# Patient Record
Sex: Male | Born: 1935 | Race: White | Hispanic: No | Smoking: Former smoker
Health system: Southern US, Community
[De-identification: ages and names within clinical notes are randomized; demographics above are authoritative.]

## PROBLEM LIST (undated history)

## (undated) DIAGNOSIS — I219 Acute myocardial infarction, unspecified: Secondary | ICD-10-CM

## (undated) DIAGNOSIS — I10 Essential (primary) hypertension: Secondary | ICD-10-CM

## (undated) DIAGNOSIS — I2510 Atherosclerotic heart disease of native coronary artery without angina pectoris: Secondary | ICD-10-CM

## (undated) DIAGNOSIS — J309 Allergic rhinitis, unspecified: Secondary | ICD-10-CM

## (undated) DIAGNOSIS — M1990 Unspecified osteoarthritis, unspecified site: Secondary | ICD-10-CM

## (undated) DIAGNOSIS — E785 Hyperlipidemia, unspecified: Secondary | ICD-10-CM

## (undated) DIAGNOSIS — M545 Low back pain, unspecified: Secondary | ICD-10-CM

## (undated) DIAGNOSIS — Z9289 Personal history of other medical treatment: Secondary | ICD-10-CM

## (undated) DIAGNOSIS — C49A2 Gastrointestinal stromal tumor of stomach: Secondary | ICD-10-CM

## (undated) DIAGNOSIS — R32 Unspecified urinary incontinence: Secondary | ICD-10-CM

## (undated) DIAGNOSIS — D6959 Other secondary thrombocytopenia: Secondary | ICD-10-CM

## (undated) DIAGNOSIS — C61 Malignant neoplasm of prostate: Secondary | ICD-10-CM

## (undated) DIAGNOSIS — D649 Anemia, unspecified: Secondary | ICD-10-CM

## (undated) DIAGNOSIS — M47816 Spondylosis without myelopathy or radiculopathy, lumbar region: Principal | ICD-10-CM

## (undated) DIAGNOSIS — Z9049 Acquired absence of other specified parts of digestive tract: Secondary | ICD-10-CM

## (undated) DIAGNOSIS — K219 Gastro-esophageal reflux disease without esophagitis: Secondary | ICD-10-CM

## (undated) DIAGNOSIS — N400 Enlarged prostate without lower urinary tract symptoms: Secondary | ICD-10-CM

## (undated) DIAGNOSIS — K635 Polyp of colon: Secondary | ICD-10-CM

## (undated) HISTORY — DX: Low back pain, unspecified: M54.50

## (undated) HISTORY — DX: Unspecified osteoarthritis, unspecified site: M19.90

## (undated) HISTORY — DX: Personal history of other medical treatment: Z92.89

## (undated) HISTORY — DX: Low back pain: M54.5

## (undated) HISTORY — DX: Unspecified urinary incontinence: R32

## (undated) HISTORY — DX: Spondylosis without myelopathy or radiculopathy, lumbar region: M47.816

---
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
## 1984-10-07 HISTORY — PX: CHOLECYSTECTOMY: SHX55

## 2000-01-12 ENCOUNTER — Inpatient Hospital Stay (HOSPITAL_COMMUNITY): Admission: EM | Admit: 2000-01-12 | Discharge: 2000-01-14 | Payer: Self-pay | Admitting: Emergency Medicine

## 2000-01-12 ENCOUNTER — Encounter: Payer: Self-pay | Admitting: Emergency Medicine

## 2002-06-08 ENCOUNTER — Encounter: Payer: Self-pay | Admitting: Family Medicine

## 2002-06-08 ENCOUNTER — Encounter: Admission: RE | Admit: 2002-06-08 | Discharge: 2002-06-08 | Payer: Self-pay | Admitting: Family Medicine

## 2004-10-19 ENCOUNTER — Encounter: Admission: RE | Admit: 2004-10-19 | Discharge: 2004-10-19 | Payer: Self-pay | Admitting: Family Medicine

## 2004-12-25 ENCOUNTER — Encounter: Admission: RE | Admit: 2004-12-25 | Discharge: 2004-12-25 | Payer: Self-pay | Admitting: Family Medicine

## 2004-12-27 ENCOUNTER — Encounter: Admission: RE | Admit: 2004-12-27 | Discharge: 2004-12-27 | Payer: Self-pay | Admitting: Family Medicine

## 2006-05-12 ENCOUNTER — Encounter: Admission: RE | Admit: 2006-05-12 | Discharge: 2006-05-12 | Payer: Self-pay | Admitting: Family Medicine

## 2007-06-15 ENCOUNTER — Emergency Department (HOSPITAL_COMMUNITY): Admission: EM | Admit: 2007-06-15 | Discharge: 2007-06-15 | Payer: Self-pay | Admitting: Emergency Medicine

## 2008-05-13 ENCOUNTER — Ambulatory Visit (HOSPITAL_COMMUNITY): Admission: RE | Admit: 2008-05-13 | Discharge: 2008-05-13 | Payer: Self-pay | Admitting: Urology

## 2008-07-12 ENCOUNTER — Encounter: Admission: RE | Admit: 2008-07-12 | Discharge: 2008-07-12 | Payer: Self-pay | Admitting: General Surgery

## 2008-10-11 ENCOUNTER — Encounter: Admission: RE | Admit: 2008-10-11 | Discharge: 2008-10-11 | Payer: Self-pay | Admitting: Family Medicine

## 2008-11-09 ENCOUNTER — Encounter: Admission: RE | Admit: 2008-11-09 | Discharge: 2008-11-09 | Payer: Self-pay | Admitting: General Surgery

## 2009-04-21 ENCOUNTER — Inpatient Hospital Stay (HOSPITAL_COMMUNITY): Admission: EM | Admit: 2009-04-21 | Discharge: 2009-04-26 | Payer: Self-pay | Admitting: Emergency Medicine

## 2009-04-24 HISTORY — PX: CORONARY ANGIOPLASTY WITH STENT PLACEMENT: SHX49

## 2009-05-07 DIAGNOSIS — D649 Anemia, unspecified: Secondary | ICD-10-CM

## 2009-05-07 HISTORY — PX: OTHER SURGICAL HISTORY: SHX169

## 2009-05-07 HISTORY — DX: Anemia, unspecified: D64.9

## 2009-05-10 ENCOUNTER — Ambulatory Visit: Payer: Self-pay | Admitting: Critical Care Medicine

## 2009-05-10 ENCOUNTER — Ambulatory Visit: Payer: Self-pay | Admitting: Internal Medicine

## 2009-05-10 ENCOUNTER — Inpatient Hospital Stay (HOSPITAL_COMMUNITY): Admission: EM | Admit: 2009-05-10 | Discharge: 2009-05-20 | Payer: Self-pay | Admitting: Emergency Medicine

## 2009-06-29 ENCOUNTER — Encounter (HOSPITAL_COMMUNITY): Admission: RE | Admit: 2009-06-29 | Discharge: 2009-10-05 | Payer: Self-pay | Admitting: Cardiology

## 2009-10-06 ENCOUNTER — Inpatient Hospital Stay (HOSPITAL_COMMUNITY): Admission: EM | Admit: 2009-10-06 | Discharge: 2009-10-26 | Payer: Self-pay | Admitting: Emergency Medicine

## 2009-10-07 HISTORY — PX: PARTIAL GASTRECTOMY: SHX6003

## 2009-10-09 ENCOUNTER — Ambulatory Visit: Payer: Self-pay | Admitting: Vascular Surgery

## 2009-10-09 ENCOUNTER — Encounter (INDEPENDENT_AMBULATORY_CARE_PROVIDER_SITE_OTHER): Payer: Self-pay | Admitting: Internal Medicine

## 2009-10-09 HISTORY — PX: TRANSTHORACIC ECHOCARDIOGRAM: SHX275

## 2009-10-14 ENCOUNTER — Encounter (INDEPENDENT_AMBULATORY_CARE_PROVIDER_SITE_OTHER): Payer: Self-pay | Admitting: Internal Medicine

## 2009-10-16 ENCOUNTER — Encounter: Payer: Self-pay | Admitting: General Surgery

## 2009-11-15 ENCOUNTER — Ambulatory Visit: Payer: Self-pay | Admitting: Oncology

## 2010-03-27 DIAGNOSIS — Z9289 Personal history of other medical treatment: Secondary | ICD-10-CM

## 2010-03-27 HISTORY — DX: Personal history of other medical treatment: Z92.89

## 2010-12-22 LAB — CBC
HCT: 27.3 % — ABNORMAL LOW (ref 39.0–52.0)
HCT: 27.6 % — ABNORMAL LOW (ref 39.0–52.0)
HCT: 27.9 % — ABNORMAL LOW (ref 39.0–52.0)
HCT: 30.2 % — ABNORMAL LOW (ref 39.0–52.0)
HCT: 30.4 % — ABNORMAL LOW (ref 39.0–52.0)
HCT: 31.2 % — ABNORMAL LOW (ref 39.0–52.0)
HCT: 31.6 % — ABNORMAL LOW (ref 39.0–52.0)
HCT: 32.3 % — ABNORMAL LOW (ref 39.0–52.0)
Hemoglobin: 10.3 g/dL — ABNORMAL LOW (ref 13.0–17.0)
Hemoglobin: 10.4 g/dL — ABNORMAL LOW (ref 13.0–17.0)
Hemoglobin: 10.5 g/dL — ABNORMAL LOW (ref 13.0–17.0)
Hemoglobin: 10.7 g/dL — ABNORMAL LOW (ref 13.0–17.0)
Hemoglobin: 10.9 g/dL — ABNORMAL LOW (ref 13.0–17.0)
Hemoglobin: 8.4 g/dL — ABNORMAL LOW (ref 13.0–17.0)
Hemoglobin: 8.7 g/dL — ABNORMAL LOW (ref 13.0–17.0)
Hemoglobin: 8.8 g/dL — ABNORMAL LOW (ref 13.0–17.0)
Hemoglobin: 9.3 g/dL — ABNORMAL LOW (ref 13.0–17.0)
Hemoglobin: 9.3 g/dL — ABNORMAL LOW (ref 13.0–17.0)
Hemoglobin: 9.4 g/dL — ABNORMAL LOW (ref 13.0–17.0)
Hemoglobin: 9.9 g/dL — ABNORMAL LOW (ref 13.0–17.0)
MCHC: 33.2 g/dL (ref 30.0–36.0)
MCHC: 33.8 g/dL (ref 30.0–36.0)
MCHC: 33.8 g/dL (ref 30.0–36.0)
MCHC: 34.2 g/dL (ref 30.0–36.0)
MCHC: 34.2 g/dL (ref 30.0–36.0)
MCHC: 34.4 g/dL (ref 30.0–36.0)
MCV: 92.1 fL (ref 78.0–100.0)
MCV: 92.7 fL (ref 78.0–100.0)
MCV: 92.8 fL (ref 78.0–100.0)
MCV: 93.3 fL (ref 78.0–100.0)
MCV: 93.4 fL (ref 78.0–100.0)
MCV: 93.9 fL (ref 78.0–100.0)
MCV: 94.3 fL (ref 78.0–100.0)
MCV: 94.7 fL (ref 78.0–100.0)
Platelets: 121 10*3/uL — ABNORMAL LOW (ref 150–400)
Platelets: 124 10*3/uL — ABNORMAL LOW (ref 150–400)
Platelets: 127 10*3/uL — ABNORMAL LOW (ref 150–400)
Platelets: 136 10*3/uL — ABNORMAL LOW (ref 150–400)
Platelets: 145 10*3/uL — ABNORMAL LOW (ref 150–400)
Platelets: 154 10*3/uL (ref 150–400)
Platelets: 162 10*3/uL (ref 150–400)
Platelets: 172 10*3/uL (ref 150–400)
Platelets: 206 10*3/uL (ref 150–400)
Platelets: 218 10*3/uL (ref 150–400)
Platelets: 226 10*3/uL (ref 150–400)
RBC: 2.62 MIL/uL — ABNORMAL LOW (ref 4.22–5.81)
RBC: 2.77 MIL/uL — ABNORMAL LOW (ref 4.22–5.81)
RBC: 2.85 MIL/uL — ABNORMAL LOW (ref 4.22–5.81)
RBC: 2.94 MIL/uL — ABNORMAL LOW (ref 4.22–5.81)
RBC: 2.95 MIL/uL — ABNORMAL LOW (ref 4.22–5.81)
RBC: 3.11 MIL/uL — ABNORMAL LOW (ref 4.22–5.81)
RBC: 3.32 MIL/uL — ABNORMAL LOW (ref 4.22–5.81)
RDW: 14.4 % (ref 11.5–15.5)
RDW: 14.5 % (ref 11.5–15.5)
RDW: 14.6 % (ref 11.5–15.5)
RDW: 14.6 % (ref 11.5–15.5)
RDW: 14.6 % (ref 11.5–15.5)
RDW: 14.7 % (ref 11.5–15.5)
RDW: 14.7 % (ref 11.5–15.5)
RDW: 15.1 % (ref 11.5–15.5)
RDW: 15.1 % (ref 11.5–15.5)
RDW: 15.6 % — ABNORMAL HIGH (ref 11.5–15.5)
WBC: 4.7 10*3/uL (ref 4.0–10.5)
WBC: 5.1 10*3/uL (ref 4.0–10.5)
WBC: 5.1 10*3/uL (ref 4.0–10.5)
WBC: 5.2 10*3/uL (ref 4.0–10.5)
WBC: 5.3 10*3/uL (ref 4.0–10.5)
WBC: 5.4 10*3/uL (ref 4.0–10.5)
WBC: 5.4 10*3/uL (ref 4.0–10.5)
WBC: 5.6 10*3/uL (ref 4.0–10.5)
WBC: 5.8 10*3/uL (ref 4.0–10.5)
WBC: 6.7 10*3/uL (ref 4.0–10.5)
WBC: 6.8 10*3/uL (ref 4.0–10.5)

## 2010-12-22 LAB — CARDIAC PANEL(CRET KIN+CKTOT+MB+TROPI)
CK, MB: 1.6 ng/mL (ref 0.3–4.0)
Relative Index: INVALID (ref 0.0–2.5)
Total CK: 68 U/L (ref 7–232)
Total CK: 78 U/L (ref 7–232)
Troponin I: 0.01 ng/mL (ref 0.00–0.06)
Troponin I: 0.01 ng/mL (ref 0.00–0.06)

## 2010-12-22 LAB — CROSSMATCH
ABO/RH(D): O POS
Antibody Screen: NEGATIVE

## 2010-12-22 LAB — BASIC METABOLIC PANEL
BUN: 17 mg/dL (ref 6–23)
BUN: 22 mg/dL (ref 6–23)
BUN: 44 mg/dL — ABNORMAL HIGH (ref 6–23)
Calcium: 8.2 mg/dL — ABNORMAL LOW (ref 8.4–10.5)
Calcium: 8.4 mg/dL (ref 8.4–10.5)
Calcium: 8.7 mg/dL (ref 8.4–10.5)
Chloride: 106 mEq/L (ref 96–112)
Chloride: 107 mEq/L (ref 96–112)
Chloride: 107 mEq/L (ref 96–112)
Chloride: 111 mEq/L (ref 96–112)
Creatinine, Ser: 1.14 mg/dL (ref 0.4–1.5)
Creatinine, Ser: 1.15 mg/dL (ref 0.4–1.5)
GFR calc Af Amer: >60 mL/min (ref >60)
GFR calc Af Amer: >60 mL/min (ref >60)
GFR calc Af Amer: >60 mL/min (ref >60)
GFR calc non Af Amer: >60 mL/min (ref >60)
GFR calc non Af Amer: >60 mL/min (ref >60)
Glucose, Bld: 93 mg/dL (ref 70–99)
Glucose, Bld: 99 mg/dL (ref 70–99)
Potassium: 3.4 mEq/L — ABNORMAL LOW (ref 3.5–5.1)
Potassium: 3.5 mEq/L (ref 3.5–5.1)
Potassium: 3.6 mEq/L (ref 3.5–5.1)
Potassium: 3.7 mEq/L (ref 3.5–5.1)
Sodium: 138 mEq/L (ref 135–145)
Sodium: 138 mEq/L (ref 135–145)
Sodium: 139 mEq/L (ref 135–145)
Sodium: 139 mEq/L (ref 135–145)
Sodium: 141 mEq/L (ref 135–145)

## 2010-12-22 LAB — DIFFERENTIAL
Basophils Absolute: 0 10*3/uL (ref 0.0–0.1)
Basophils Relative: 0 % (ref 0–1)
Eosinophils Absolute: 0.1 10*3/uL (ref 0.0–0.7)
Eosinophils Relative: 1 % (ref 0–5)
Eosinophils Relative: 3 % (ref 0–5)
Lymphocytes Relative: 14 % (ref 12–46)
Lymphocytes Relative: 17 % (ref 12–46)
Lymphs Abs: 1.2 10*3/uL (ref 0.7–4.0)
Monocytes Absolute: 0.3 10*3/uL (ref 0.1–1.0)
Monocytes Absolute: 0.4 10*3/uL (ref 0.1–1.0)
Monocytes Absolute: 0.5 10*3/uL (ref 0.1–1.0)
Monocytes Relative: 8 % (ref 3–12)
Monocytes Relative: 9 % (ref 3–12)
Neutro Abs: 3.2 10*3/uL (ref 1.7–7.7)
Neutrophils Relative %: 63 % (ref 43–77)
Neutrophils Relative %: 80 % — ABNORMAL HIGH (ref 43–77)
Smear Review: ADEQUATE

## 2010-12-22 LAB — COMPREHENSIVE METABOLIC PANEL
AST: 26 U/L (ref 0–37)
BUN: 38 mg/dL — ABNORMAL HIGH (ref 6–23)
CO2: 27 mEq/L (ref 19–32)
CO2: 28 mEq/L (ref 19–32)
Chloride: 108 mEq/L (ref 96–112)
Chloride: 111 mEq/L (ref 96–112)
Creatinine, Ser: 1 mg/dL (ref 0.4–1.5)
Creatinine, Ser: 1.1 mg/dL (ref 0.4–1.5)
GFR calc Af Amer: >60 mL/min (ref >60)
GFR calc non Af Amer: >60 mL/min (ref >60)
GFR calc non Af Amer: >60 mL/min (ref >60)
Total Bilirubin: 0.8 mg/dL (ref 0.3–1.2)
Total Bilirubin: 1 mg/dL (ref 0.3–1.2)

## 2010-12-22 LAB — VITAMIN B12: Vitamin B-12: 307 pg/mL (ref 211–911)

## 2010-12-22 LAB — RETICULOCYTES
RBC.: 2.86 MIL/uL — ABNORMAL LOW (ref 4.22–5.81)
Retic Count, Absolute: 31.5 10*3/uL (ref 19.0–186.0)

## 2010-12-22 LAB — IRON: Iron: 216 ug/dL — ABNORMAL HIGH (ref 42–135)

## 2010-12-22 LAB — PROTIME-INR
INR: 1.04 (ref 0.00–1.49)
INR: 1.05 (ref 0.00–1.49)
Prothrombin Time: 13.5 seconds (ref 11.6–15.2)

## 2010-12-22 LAB — APTT
aPTT: 25 seconds (ref 24–37)
aPTT: 25 seconds (ref 24–37)

## 2010-12-22 LAB — MAGNESIUM: Magnesium: 1.7 mg/dL (ref 1.5–2.5)

## 2010-12-22 LAB — IRON AND TIBC
Iron: 158 ug/dL — ABNORMAL HIGH (ref 42–135)
Saturation Ratios: 62 % — ABNORMAL HIGH (ref 20–55)
TIBC: 255 ug/dL (ref 215–435)
UIBC: 97 ug/dL

## 2010-12-22 LAB — ABO/RH: ABO/RH(D): O POS

## 2010-12-22 LAB — PREPARE RBC (CROSSMATCH)

## 2010-12-22 LAB — FOLATE RBC: RBC Folate: 787 ng/mL — ABNORMAL HIGH (ref 180–600)

## 2010-12-22 LAB — FERRITIN: Ferritin: 36 ng/mL (ref 22–322)

## 2010-12-23 LAB — BASIC METABOLIC PANEL
BUN: 12 mg/dL (ref 6–23)
BUN: 12 mg/dL (ref 6–23)
BUN: 7 mg/dL (ref 6–23)
BUN: 7 mg/dL (ref 6–23)
BUN: 9 mg/dL (ref 6–23)
BUN: 21 mg/dL (ref 6–23)
CO2: 25 mEq/L (ref 19–32)
CO2: 25 mEq/L (ref 19–32)
CO2: 26 mEq/L (ref 19–32)
CO2: 25 mEq/L (ref 19–32)
Calcium: 8.1 mg/dL — ABNORMAL LOW (ref 8.4–10.5)
Calcium: 8.1 mg/dL — ABNORMAL LOW (ref 8.4–10.5)
Calcium: 8.3 mg/dL — ABNORMAL LOW (ref 8.4–10.5)
Calcium: 8.3 mg/dL — ABNORMAL LOW (ref 8.4–10.5)
Calcium: 8.4 mg/dL (ref 8.4–10.5)
Calcium: 8.6 mg/dL (ref 8.4–10.5)
Chloride: 102 mEq/L (ref 96–112)
Chloride: 104 mEq/L (ref 96–112)
Chloride: 106 mEq/L (ref 96–112)
Creatinine, Ser: 1.07 mg/dL (ref 0.4–1.5)
Creatinine, Ser: 1.09 mg/dL (ref 0.4–1.5)
Creatinine, Ser: 1.1 mg/dL (ref 0.4–1.5)
Creatinine, Ser: 1.12 mg/dL (ref 0.4–1.5)
Creatinine, Ser: 1.14 mg/dL (ref 0.4–1.5)
Creatinine, Ser: 1.14 mg/dL (ref 0.4–1.5)
GFR calc Af Amer: >60 mL/min (ref >60)
GFR calc Af Amer: >60 mL/min (ref >60)
GFR calc non Af Amer: 60 mL/min — ABNORMAL LOW (ref >60)
GFR calc non Af Amer: >60 mL/min (ref >60)
GFR calc non Af Amer: >60 mL/min (ref >60)
GFR calc non Af Amer: >60 mL/min (ref >60)
Glucose, Bld: 104 mg/dL — ABNORMAL HIGH (ref 70–99)
Glucose, Bld: 111 mg/dL — ABNORMAL HIGH (ref 70–99)
Glucose, Bld: 119 mg/dL — ABNORMAL HIGH (ref 70–99)
Glucose, Bld: 131 mg/dL — ABNORMAL HIGH (ref 70–99)
Glucose, Bld: 168 mg/dL — ABNORMAL HIGH (ref 70–99)
Potassium: 3.6 mEq/L (ref 3.5–5.1)
Sodium: 134 mEq/L — ABNORMAL LOW (ref 135–145)
Sodium: 138 mEq/L (ref 135–145)

## 2010-12-23 LAB — CBC
HCT: 27.1 % — ABNORMAL LOW (ref 39.0–52.0)
HCT: 32.4 % — ABNORMAL LOW (ref 39.0–52.0)
Hemoglobin: 10.5 g/dL — ABNORMAL LOW (ref 13.0–17.0)
Hemoglobin: 10.7 g/dL — ABNORMAL LOW (ref 13.0–17.0)
MCHC: 34 g/dL (ref 30.0–36.0)
MCHC: 34.5 g/dL (ref 30.0–36.0)
MCHC: 34.7 g/dL (ref 30.0–36.0)
MCHC: 33.3 g/dL (ref 30.0–36.0)
MCHC: 33.8 g/dL (ref 30.0–36.0)
MCV: 94.9 fL (ref 78.0–100.0)
MCV: 96 fL (ref 78.0–100.0)
MCV: 96 fL (ref 78.0–100.0)
Platelets: 258 10*3/uL (ref 150–400)
Platelets: 259 10*3/uL (ref 150–400)
Platelets: 260 10*3/uL (ref 150–400)
Platelets: 266 10*3/uL (ref 150–400)
Platelets: 281 10*3/uL (ref 150–400)
Platelets: 457 10*3/uL — ABNORMAL HIGH (ref 150–400)
Platelets: 394 10*3/uL (ref 150–400)
RBC: 3.21 MIL/uL — ABNORMAL LOW (ref 4.22–5.81)
RDW: 15 % (ref 11.5–15.5)
RDW: 15.5 % (ref 11.5–15.5)
RDW: 15.5 % (ref 11.5–15.5)
RDW: 15.6 % — ABNORMAL HIGH (ref 11.5–15.5)
RDW: 15.8 % — ABNORMAL HIGH (ref 11.5–15.5)
RDW: 15.9 % — ABNORMAL HIGH (ref 11.5–15.5)
WBC: 6.7 10*3/uL (ref 4.0–10.5)
WBC: 8.9 10*3/uL (ref 4.0–10.5)
WBC: 9 10*3/uL (ref 4.0–10.5)

## 2010-12-23 LAB — COMPREHENSIVE METABOLIC PANEL
ALT: 36 U/L (ref 0–53)
AST: 26 U/L (ref 0–37)
Calcium: 8.3 mg/dL — ABNORMAL LOW (ref 8.4–10.5)
GFR calc Af Amer: >60 mL/min (ref >60)
Sodium: 143 mEq/L (ref 135–145)
Total Protein: 5.3 g/dL — ABNORMAL LOW (ref 6.0–8.3)

## 2010-12-23 LAB — MAGNESIUM
Magnesium: 1.7 mg/dL (ref 1.5–2.5)
Magnesium: 1.8 mg/dL (ref 1.5–2.5)
Magnesium: 2.3 mg/dL (ref 1.5–2.5)
Magnesium: 2.4 mg/dL (ref 1.5–2.5)

## 2010-12-23 LAB — FOLATE RBC: RBC Folate: 1326 ng/mL — ABNORMAL HIGH (ref 180–600)

## 2010-12-23 LAB — PHOSPHORUS
Phosphorus: 1.7 mg/dL — ABNORMAL LOW (ref 2.3–4.6)
Phosphorus: 2.2 mg/dL — ABNORMAL LOW (ref 2.3–4.6)
Phosphorus: 2.7 mg/dL (ref 2.3–4.6)
Phosphorus: 2.7 mg/dL (ref 2.3–4.6)
Phosphorus: 3.1 mg/dL (ref 2.3–4.6)

## 2010-12-23 LAB — HEMOGLOBIN AND HEMATOCRIT, BLOOD: Hemoglobin: 9.7 g/dL — ABNORMAL LOW (ref 13.0–17.0)

## 2011-01-07 LAB — CARDIAC PANEL(CRET KIN+CKTOT+MB+TROPI)
CK, MB: 2 ng/mL (ref 0.3–4.0)
Total CK: 89 U/L (ref 7–232)

## 2011-01-07 LAB — HEPATIC FUNCTION PANEL
Alkaline Phosphatase: 52 U/L (ref 39–117)
Bilirubin, Direct: 0.1 mg/dL (ref 0.0–0.3)
Indirect Bilirubin: 0.3 mg/dL (ref 0.3–0.9)
Total Protein: 6 g/dL (ref 6.0–8.3)

## 2011-01-07 LAB — POCT CARDIAC MARKERS: Troponin i, poc: <0.05 ng/mL (ref 0.00–0.09)

## 2011-01-07 LAB — POCT I-STAT, CHEM 8
Calcium, Ion: 1.09 mmol/L — ABNORMAL LOW (ref 1.12–1.32)
Chloride: 110 mEq/L (ref 96–112)
HCT: 35 % — ABNORMAL LOW (ref 39.0–52.0)
Hemoglobin: 11.9 g/dL — ABNORMAL LOW (ref 13.0–17.0)
TCO2: 30 mmol/L (ref 0–100)

## 2011-01-07 LAB — URINALYSIS, ROUTINE W REFLEX MICROSCOPIC
Ketones, ur: NEGATIVE mg/dL
Nitrite: NEGATIVE
Urobilinogen, UA: 0.2 mg/dL (ref 0.0–1.0)
pH: 6 (ref 5.0–8.0)

## 2011-01-12 LAB — CBC
HCT: 28.2 % — ABNORMAL LOW (ref 39.0–52.0)
HCT: 28.9 % — ABNORMAL LOW (ref 39.0–52.0)
HCT: 30.4 % — ABNORMAL LOW (ref 39.0–52.0)
HCT: 30.6 % — ABNORMAL LOW (ref 39.0–52.0)
HCT: 30.8 % — ABNORMAL LOW (ref 39.0–52.0)
HCT: 33.3 % — ABNORMAL LOW (ref 39.0–52.0)
HCT: 34 % — ABNORMAL LOW (ref 39.0–52.0)
Hemoglobin: 10.5 g/dL — ABNORMAL LOW (ref 13.0–17.0)
Hemoglobin: 10.6 g/dL — ABNORMAL LOW (ref 13.0–17.0)
Hemoglobin: 10.9 g/dL — ABNORMAL LOW (ref 13.0–17.0)
Hemoglobin: 11.1 g/dL — ABNORMAL LOW (ref 13.0–17.0)
Hemoglobin: 11.1 g/dL — ABNORMAL LOW (ref 13.0–17.0)
Hemoglobin: 11.2 g/dL — ABNORMAL LOW (ref 13.0–17.0)
Hemoglobin: 8.7 g/dL — ABNORMAL LOW (ref 13.0–17.0)
Hemoglobin: 9.4 g/dL — ABNORMAL LOW (ref 13.0–17.0)
MCHC: 33.6 g/dL (ref 30.0–36.0)
MCHC: 33.7 g/dL (ref 30.0–36.0)
MCHC: 34.3 g/dL (ref 30.0–36.0)
MCHC: 34.3 g/dL (ref 30.0–36.0)
MCHC: 34.5 g/dL (ref 30.0–36.0)
MCHC: 34.5 g/dL (ref 30.0–36.0)
MCHC: 34.5 g/dL (ref 30.0–36.0)
MCHC: 34.5 g/dL (ref 30.0–36.0)
MCHC: 34.7 g/dL (ref 30.0–36.0)
MCV: 90.6 fL (ref 78.0–100.0)
MCV: 91.3 fL (ref 78.0–100.0)
MCV: 91.3 fL (ref 78.0–100.0)
MCV: 91.7 fL (ref 78.0–100.0)
MCV: 91.9 fL (ref 78.0–100.0)
MCV: 92.2 fL (ref 78.0–100.0)
MCV: 92.8 fL (ref 78.0–100.0)
Platelets: 108 10*3/uL — ABNORMAL LOW (ref 150–400)
Platelets: 120 10*3/uL — ABNORMAL LOW (ref 150–400)
Platelets: 124 10*3/uL — ABNORMAL LOW (ref 150–400)
Platelets: 135 10*3/uL — ABNORMAL LOW (ref 150–400)
Platelets: 150 10*3/uL (ref 150–400)
Platelets: 161 10*3/uL (ref 150–400)
Platelets: 197 10*3/uL (ref 150–400)
RBC: 2.69 MIL/uL — ABNORMAL LOW (ref 4.22–5.81)
RBC: 2.98 MIL/uL — ABNORMAL LOW (ref 4.22–5.81)
RBC: 3.11 MIL/uL — ABNORMAL LOW (ref 4.22–5.81)
RBC: 3.22 MIL/uL — ABNORMAL LOW (ref 4.22–5.81)
RBC: 3.28 MIL/uL — ABNORMAL LOW (ref 4.22–5.81)
RBC: 3.34 MIL/uL — ABNORMAL LOW (ref 4.22–5.81)
RBC: 3.35 MIL/uL — ABNORMAL LOW (ref 4.22–5.81)
RBC: 3.4 MIL/uL — ABNORMAL LOW (ref 4.22–5.81)
RBC: 3.49 MIL/uL — ABNORMAL LOW (ref 4.22–5.81)
RBC: 3.57 MIL/uL — ABNORMAL LOW (ref 4.22–5.81)
RBC: 3.57 MIL/uL — ABNORMAL LOW (ref 4.22–5.81)
RDW: 13.9 % (ref 11.5–15.5)
RDW: 15 % (ref 11.5–15.5)
RDW: 15.2 % (ref 11.5–15.5)
RDW: 15.5 % (ref 11.5–15.5)
RDW: 15.5 % (ref 11.5–15.5)
RDW: 15.5 % (ref 11.5–15.5)
RDW: 15.6 % — ABNORMAL HIGH (ref 11.5–15.5)
RDW: 16.3 % — ABNORMAL HIGH (ref 11.5–15.5)
RDW: 16.6 % — ABNORMAL HIGH (ref 11.5–15.5)
RDW: 16.7 % — ABNORMAL HIGH (ref 11.5–15.5)
WBC: 10 10*3/uL (ref 4.0–10.5)
WBC: 10.8 10*3/uL — ABNORMAL HIGH (ref 4.0–10.5)
WBC: 11.1 10*3/uL — ABNORMAL HIGH (ref 4.0–10.5)
WBC: 12.3 10*3/uL — ABNORMAL HIGH (ref 4.0–10.5)
WBC: 6.2 10*3/uL (ref 4.0–10.5)
WBC: 6.3 10*3/uL (ref 4.0–10.5)
WBC: 6.5 10*3/uL (ref 4.0–10.5)
WBC: 6.9 10*3/uL (ref 4.0–10.5)
WBC: 7 10*3/uL (ref 4.0–10.5)
WBC: 7.9 10*3/uL (ref 4.0–10.5)

## 2011-01-12 LAB — COMPREHENSIVE METABOLIC PANEL
ALT: <8 U/L (ref 0–53)
AST: 19 U/L (ref 0–37)
AST: 22 U/L (ref 0–37)
Albumin: 2.8 g/dL — ABNORMAL LOW (ref 3.5–5.2)
Albumin: 3.1 g/dL — ABNORMAL LOW (ref 3.5–5.2)
Alkaline Phosphatase: 41 U/L (ref 39–117)
Alkaline Phosphatase: 42 U/L (ref 39–117)
Alkaline Phosphatase: 48 U/L (ref 39–117)
BUN: 39 mg/dL — ABNORMAL HIGH (ref 6–23)
BUN: 47 mg/dL — ABNORMAL HIGH (ref 6–23)
CO2: 24 mEq/L (ref 19–32)
Chloride: 108 mEq/L (ref 96–112)
Chloride: 111 mEq/L (ref 96–112)
Chloride: 114 mEq/L — ABNORMAL HIGH (ref 96–112)
Creatinine, Ser: 1.17 mg/dL (ref 0.4–1.5)
GFR calc Af Amer: >60 mL/min (ref >60)
GFR calc Af Amer: >60 mL/min (ref >60)
GFR calc non Af Amer: 57 mL/min — ABNORMAL LOW (ref >60)
Glucose, Bld: 149 mg/dL — ABNORMAL HIGH (ref 70–99)
Potassium: 4.2 mEq/L (ref 3.5–5.1)
Potassium: 4.2 mEq/L (ref 3.5–5.1)
Potassium: 4.6 mEq/L (ref 3.5–5.1)
Sodium: 141 mEq/L (ref 135–145)
Total Bilirubin: 0.5 mg/dL (ref 0.3–1.2)
Total Bilirubin: 0.6 mg/dL (ref 0.3–1.2)
Total Bilirubin: 0.6 mg/dL (ref 0.3–1.2)
Total Protein: 5 g/dL — ABNORMAL LOW (ref 6.0–8.3)

## 2011-01-12 LAB — BASIC METABOLIC PANEL
BUN: 14 mg/dL (ref 6–23)
BUN: 24 mg/dL — ABNORMAL HIGH (ref 6–23)
BUN: 48 mg/dL — ABNORMAL HIGH (ref 6–23)
CO2: 25 mEq/L (ref 19–32)
CO2: 27 mEq/L (ref 19–32)
CO2: 28 mEq/L (ref 19–32)
Calcium: 7.7 mg/dL — ABNORMAL LOW (ref 8.4–10.5)
Calcium: 7.9 mg/dL — ABNORMAL LOW (ref 8.4–10.5)
Calcium: 8 mg/dL — ABNORMAL LOW (ref 8.4–10.5)
Calcium: 8.1 mg/dL — ABNORMAL LOW (ref 8.4–10.5)
Chloride: 110 mEq/L (ref 96–112)
Chloride: 113 mEq/L — ABNORMAL HIGH (ref 96–112)
Creatinine, Ser: 0.83 mg/dL (ref 0.4–1.5)
Creatinine, Ser: 0.97 mg/dL (ref 0.4–1.5)
Creatinine, Ser: 1.04 mg/dL (ref 0.4–1.5)
Creatinine, Ser: 1.12 mg/dL (ref 0.4–1.5)
GFR calc Af Amer: >60 mL/min (ref >60)
GFR calc Af Amer: >60 mL/min (ref >60)
GFR calc Af Amer: >60 mL/min (ref >60)
GFR calc non Af Amer: 52 mL/min — ABNORMAL LOW (ref >60)
GFR calc non Af Amer: >60 mL/min (ref >60)
GFR calc non Af Amer: >60 mL/min (ref >60)
GFR calc non Af Amer: >60 mL/min (ref >60)
Glucose, Bld: 100 mg/dL — ABNORMAL HIGH (ref 70–99)
Glucose, Bld: 132 mg/dL — ABNORMAL HIGH (ref 70–99)
Potassium: 3.7 mEq/L (ref 3.5–5.1)
Potassium: 3.8 mEq/L (ref 3.5–5.1)
Potassium: 3.9 mEq/L (ref 3.5–5.1)
Sodium: 136 mEq/L (ref 135–145)
Sodium: 143 mEq/L (ref 135–145)
Sodium: 144 mEq/L (ref 135–145)

## 2011-01-12 LAB — CROSSMATCH

## 2011-01-12 LAB — LIPID PANEL
LDL Cholesterol: 43 mg/dL (ref 0–99)
Total CHOL/HDL Ratio: 1.9 RATIO
Triglycerides: 76 mg/dL (ref <150)
VLDL: 15 mg/dL (ref 0–40)

## 2011-01-12 LAB — POCT CARDIAC MARKERS: CKMB, poc: <1 ng/mL — ABNORMAL LOW (ref 1.0–8.0)

## 2011-01-12 LAB — GLUCOSE, CAPILLARY: Glucose-Capillary: 103 mg/dL — ABNORMAL HIGH (ref 70–99)

## 2011-01-12 LAB — ABO/RH: ABO/RH(D): O POS

## 2011-01-12 LAB — HEMOGLOBIN AND HEMATOCRIT, BLOOD
HCT: 28.9 % — ABNORMAL LOW (ref 39.0–52.0)
HCT: 32.3 % — ABNORMAL LOW (ref 39.0–52.0)
HCT: 33.7 % — ABNORMAL LOW (ref 39.0–52.0)
HCT: 35 % — ABNORMAL LOW (ref 39.0–52.0)
HCT: 39.4 % (ref 39.0–52.0)
Hemoglobin: 10.9 g/dL — ABNORMAL LOW (ref 13.0–17.0)
Hemoglobin: 12.1 g/dL — ABNORMAL LOW (ref 13.0–17.0)
Hemoglobin: 9.8 g/dL — ABNORMAL LOW (ref 13.0–17.0)

## 2011-01-12 LAB — DIFFERENTIAL
Basophils Absolute: 0.1 10*3/uL (ref 0.0–0.1)
Basophils Relative: 1 % (ref 0–1)
Eosinophils Absolute: 0 10*3/uL (ref 0.0–0.7)
Monocytes Relative: 3 % (ref 3–12)
Neutrophils Relative %: 89 % — ABNORMAL HIGH (ref 43–77)

## 2011-01-12 LAB — CK TOTAL AND CKMB (NOT AT ARMC): CK, MB: 1.1 ng/mL (ref 0.3–4.0)

## 2011-01-12 LAB — CARDIAC PANEL(CRET KIN+CKTOT+MB+TROPI)
CK, MB: 1.1 ng/mL (ref 0.3–4.0)
Relative Index: INVALID (ref 0.0–2.5)
Relative Index: INVALID (ref 0.0–2.5)
Total CK: 51 U/L (ref 7–232)
Troponin I: 0.01 ng/mL (ref 0.00–0.06)
Troponin I: 0.01 ng/mL (ref 0.00–0.06)

## 2011-01-12 LAB — POCT I-STAT, CHEM 8
Glucose, Bld: 120 mg/dL — ABNORMAL HIGH (ref 70–99)
HCT: 33 % — ABNORMAL LOW (ref 39.0–52.0)
Hemoglobin: 11.2 g/dL — ABNORMAL LOW (ref 13.0–17.0)
Potassium: 4.4 mEq/L (ref 3.5–5.1)
Sodium: 139 mEq/L (ref 135–145)

## 2011-01-12 LAB — PROTIME-INR
INR: 1.1 (ref 0.00–1.49)
Prothrombin Time: 14.4 seconds (ref 11.6–15.2)

## 2011-01-12 LAB — MAGNESIUM: Magnesium: 2.2 mg/dL (ref 1.5–2.5)

## 2011-01-12 LAB — BRAIN NATRIURETIC PEPTIDE: Pro B Natriuretic peptide (BNP): 65 pg/mL (ref 0.0–100.0)

## 2011-01-12 LAB — RETICULOCYTES: Retic Count, Absolute: 110.4 10*3/uL (ref 19.0–186.0)

## 2011-01-13 LAB — BASIC METABOLIC PANEL
BUN: 10 mg/dL (ref 6–23)
BUN: 13 mg/dL (ref 6–23)
BUN: 13 mg/dL (ref 6–23)
BUN: 20 mg/dL (ref 6–23)
CO2: 25 mEq/L (ref 19–32)
CO2: 31 mEq/L (ref 19–32)
Calcium: 8.3 mg/dL — ABNORMAL LOW (ref 8.4–10.5)
Calcium: 9 mg/dL (ref 8.4–10.5)
Calcium: 9.1 mg/dL (ref 8.4–10.5)
Chloride: 103 mEq/L (ref 96–112)
Chloride: 104 mEq/L (ref 96–112)
Creatinine, Ser: 0.95 mg/dL (ref 0.4–1.5)
GFR calc Af Amer: >60 mL/min (ref >60)
GFR calc Af Amer: >60 mL/min (ref >60)
GFR calc non Af Amer: >60 mL/min (ref >60)
GFR calc non Af Amer: >60 mL/min (ref >60)
GFR calc non Af Amer: >60 mL/min (ref >60)
GFR calc non Af Amer: >60 mL/min (ref >60)
Glucose, Bld: 100 mg/dL — ABNORMAL HIGH (ref 70–99)
Glucose, Bld: 111 mg/dL — ABNORMAL HIGH (ref 70–99)
Glucose, Bld: 90 mg/dL (ref 70–99)
Glucose, Bld: 93 mg/dL (ref 70–99)
Potassium: 3.8 mEq/L (ref 3.5–5.1)
Potassium: 4 mEq/L (ref 3.5–5.1)
Potassium: 4.1 mEq/L (ref 3.5–5.1)
Potassium: 4.3 mEq/L (ref 3.5–5.1)
Sodium: 140 mEq/L (ref 135–145)
Sodium: 141 mEq/L (ref 135–145)

## 2011-01-13 LAB — LIPID PANEL
HDL: 95 mg/dL (ref >39)
Triglycerides: 68 mg/dL (ref <150)

## 2011-01-13 LAB — CARDIAC PANEL(CRET KIN+CKTOT+MB+TROPI)
CK, MB: 10.8 ng/mL — ABNORMAL HIGH (ref 0.3–4.0)
CK, MB: 6.4 ng/mL — ABNORMAL HIGH (ref 0.3–4.0)
Relative Index: 2.1 (ref 0.0–2.5)
Relative Index: 2.7 — ABNORMAL HIGH (ref 0.0–2.5)
Relative Index: 4.8 — ABNORMAL HIGH (ref 0.0–2.5)
Total CK: 224 U/L (ref 7–232)
Troponin I: 1.62 ng/mL (ref 0.00–0.06)

## 2011-01-13 LAB — CBC
HCT: 35.6 % — ABNORMAL LOW (ref 39.0–52.0)
HCT: 41.9 % (ref 39.0–52.0)
Hemoglobin: 14.4 g/dL (ref 13.0–17.0)
Hemoglobin: 14.9 g/dL (ref 13.0–17.0)
MCHC: 34 g/dL (ref 30.0–36.0)
MCHC: 34.3 g/dL (ref 30.0–36.0)
MCV: 96.5 fL (ref 78.0–100.0)
Platelets: 177 10*3/uL (ref 150–400)
Platelets: 185 10*3/uL (ref 150–400)
RBC: 4.33 MIL/uL (ref 4.22–5.81)
RBC: 4.52 MIL/uL (ref 4.22–5.81)
RDW: 13.7 % (ref 11.5–15.5)
RDW: 13.7 % (ref 11.5–15.5)
RDW: 13.8 % (ref 11.5–15.5)
RDW: 13.9 % (ref 11.5–15.5)
WBC: 4.8 10*3/uL (ref 4.0–10.5)
WBC: 5.8 10*3/uL (ref 4.0–10.5)
WBC: 7.2 10*3/uL (ref 4.0–10.5)

## 2011-01-13 LAB — CK TOTAL AND CKMB (NOT AT ARMC)
Relative Index: 2.6 — ABNORMAL HIGH (ref 0.0–2.5)
Total CK: 132 U/L (ref 7–232)

## 2011-01-13 LAB — DIFFERENTIAL
Basophils Absolute: 0 10*3/uL (ref 0.0–0.1)
Basophils Relative: 0 % (ref 0–1)
Neutro Abs: 4.5 10*3/uL (ref 1.7–7.7)
Neutrophils Relative %: 77 % (ref 43–77)

## 2011-01-13 LAB — APTT
aPTT: 28 seconds (ref 24–37)
aPTT: 34 seconds (ref 24–37)

## 2011-01-13 LAB — TSH: TSH: 1.536 u[IU]/mL (ref 0.350–4.500)

## 2011-01-13 LAB — PROTIME-INR
INR: 1 (ref 0.00–1.49)
INR: 1 (ref 0.00–1.49)
Prothrombin Time: 13.1 seconds (ref 11.6–15.2)
Prothrombin Time: 13.4 seconds (ref 11.6–15.2)

## 2011-01-13 LAB — TROPONIN I
Troponin I: 0.18 ng/mL — ABNORMAL HIGH (ref 0.00–0.06)
Troponin I: 0.45 ng/mL — ABNORMAL HIGH (ref 0.00–0.06)
Troponin I: 0.47 ng/mL — ABNORMAL HIGH (ref 0.00–0.06)

## 2011-01-13 LAB — HEMOGLOBIN A1C
Hgb A1c MFr Bld: 6 % (ref 4.6–6.1)
Mean Plasma Glucose: 126 mg/dL

## 2011-02-19 NOTE — Cardiovascular Report (Signed)
NAME:  Eric Cooley, SCALISE NO.:  [id number]   MEDICAL RECORD NO.:  [id number]          PATIENT TYPE:  INP   LOCATION:  3702                         FACILITY:  [facility]   PHYSICIAN:  Cristy Hilts. Jacinto Halim, MD       DATE OF BIRTH:  [birth year]   DATE OF PROCEDURE:  04/24/2009  DATE OF DISCHARGE:                            CARDIAC CATHETERIZATION   PROCEDURE PERFORMED:  Percutaneous transluminal coronary angioplasty and  stenting of the proximal and mid left anterior descending.   INDICATIONS:  Mr. Luverne Zerkle is an 77 year old gentleman who was  admitted with unstable angina.  He was ruled out for myocardial  infarction.  He underwent cardiac catheterization by Dr. Ritta Slot  this morning and at this had revealed high-grade tandem 99% stenoses of  the mid-LAD.   INTERVENTION DATA:  Successful PTCA and stenting of the proximal and mid  segment of the LAD with implantation of a 3.5 x 18 mm Xience stent  deployed at 10 atmospheric pressure for 40 seconds.  Following stent  implantation, TIMI I flow was noted with the patient experiencing sinus  bradycardia, eventually cardiac arrest, and V-fib arrest needing very  brief CPR, and administration of 1 mg atropine and 1 ampule of  epinephrine.  He was immediately stabilized.  He was also defibrillated  at 120 joules.   Successful PTCA and stenting with a 3.0 x 12 mm overlapping stent into  the distal dissection flap of the previously placed stent.  Overall, the  stenosis in the entire proximal to mid segment of the LAD was reduced  from 99% to 0% with brisk TIMI III to TIMI III flow maintained at the  end of the procedure.   Postprocedure, the patient remained hemodynamically stable, chest  painfree without any sequelae.   RECOMMENDATIONS:  The patient will be transferred to the transitional  care unit for closer observation given his cardiac arrest during PCI.  I  suspect he will do well with excellent results.  He should be  able to be  discharged home in 1-2 days.   He will need aspirin indefinitely and Plavix at least for a period of a  year.   TECHNIQUE OF THE PROCEDURE:  For diagnostic cardiac catheterization,  please see the notes from Dr. Ritta Slot.   INTERVENTIONAL PROCEDURE:  A 6-French XB 35 guide catheter was utilized  to engage left main coronary artery.  Using Angiomax for anticoagulation  a 190 cm x 0.01/4 of an inch ATW guidewire was advanced into the LAD.  Lesion length was carefully measured.  Using a 3.0 x 20 mm apex balloon,  predilatation at 6 atmospheric pressure was performed for 40 seconds  with establishment of brisk TIMI III flow.  The frame count had  decreased post balloon angioplasty.  Because of residual stenosis and  also presence of questionable ulcerated plaque in the mid segment of the  LAD, we decided to proceed with stenting with a 3.5 x 80 mm Xience,  which was deployed at 10 atmospheric pressure for 40 seconds.  Following  this, the patient experienced a bradycardia  cardiac arrest needing brief  CPR.  The patient, however, was stabilized immediately with  establishment of TIMI III flow.  Significant reduction in TIMI flow was  noted after the stent was implanted.  Initial suspicion was distal  embolization, but different angiographic view suggested probable  proximal dissection, which was stented with a 3.0 x 12 mm Xience stent  at 12 atmospheric pressure.  The same stent balloon was gently withdrawn  into the proximal segment, which is a stent strut and high-pressure  inflation at 16 atmospheric pressure was performed for 20 seconds  followed by same stent into the previously placed stent at 18  atmospheric pressure for 30 seconds.  Having performed this,  intracoronary was administered and angiography was performed.  Excellent  results were noted.  The guidewire was withdrawn and angiography  repeated.  Guide catheter disengaged and pulled out of the body.  The   patient tolerated the procedure well.  He remained hemodynamically  stable and clinically stable.      Cristy Hilts. Jacinto Halim, MD     JRG/MEDQ  D:  04/24/2009  T:  04/25/2009  Job:  [id number]   cc:   Quita Skye. Artis Flock, M.D.

## 2011-02-19 NOTE — Discharge Summary (Signed)
NAME:  Eric Cooley, Eric Cooley NO.:  [id number]   MEDICAL RECORD NO.:  [id number]          PATIENT TYPE:  INP   LOCATION:  2035                         FACILITY:  [facility]   PHYSICIAN:  Ruthy Dick, MD    DATE OF BIRTH:  1919-11-12   DATE OF ADMISSION:  05/10/2009  DATE OF DISCHARGE:  05/20/2009                               DISCHARGE SUMMARY   REASON FOR ADMISSION:  Massive upper GI bleed.   FINAL DISCHARGE DIAGNOSES:  1. Acute upper gastrointestinal bleed secondary to stromal tumors      status post embolization.  2. New gastric ulcer noted.  3. Acute blood loss anemia status post transfusion of about 6 units of      packed red blood cells.  4. Gastrointestinal stromal tumor.  5. Presyncopal episode and dizziness probably secondary to beta-      blocker, dose of Lopressor was reduced.  6. Hypertension.  7. Dyslipidemia.  8. Coronary artery disease with recent stent placement.  9. Recent non-ST elevation myocardial infarction.  The patient is to      continue to be on Plavix and aspirin according to the Cardiology      consults.   CONSULTS DURING THIS ADMISSION:  1. Cardiology consult.  2. Interventional Radiology consult.  3. Gastroenterology consult.   PROCEDURES DONE DURING THIS ADMISSION:  1. Interventional Radiology embolization of left gastric artery.  2. EGD, which showed a new diagnosis of gastric ulcer, which was      likely the cause of his most recent bleed as evidenced by blanched      appearance of the mucosa on all sides.  There was also a      gastrointestinal stromal tumor, which was stable in appearance with      no obvious stigmata of hemorrhage.   BRIEF HISTORY OF PRESENT ILLNESS AND HOSPITAL COURSE:  This is an 12-  year-old male with a history of pancreatic cancer and also recent  admission for coronary artery disease with stent placement, who started  having a new syncopal episode at home.  911 was called.  Plan was put in  place to  re-do a cardiac cath for this patient because of his  presyncopal episodes.  However, the patient started vomiting blood in  the hospital and an EGD was done, and the patient was noted to have  gastrointestinal stromal tumor.  The patient was sent to Interventional  Radiology where 4 coils were placed in the left gastric artery and  cessation of bleeding occurred.  A repeat EGD was done and another  repeat EGD was done subsequently and another newly diagnosed gastric  ulcer was noted.  The assumption then was that his bleeding was likely  due to the gastric ulcer and not due to the stromal tumor.  In any case,  the patient has been transfused packed red blood cells while he was in  the hospital and his hemoglobin and hematocrit has remained stable for  the past 3-4 days.  The patient's symptoms has also resolved.  He is no  more nauseated.  He is eating  well.  No chest pain.  No shortness of  breath.  No abdominal pain.  No nausea.  No vomiting.  No diarrhea.  No  constipation.  He did have a presyncopal episode about 3 days ago, and  this was thought to have been due to his use of better-blockers.  Lopressor was therefore reduced to 12.5 b.i.d. and since then, the  patient has not had any more symptoms.  He seems to be in good spirits  and says he wants to go home.  Denies dysuria, frequency, urgency.  Denies syncope.  Denies palpitations.   PHYSICAL EXAMINATION:  VITAL SIGNS:  Today are temperature 99.1, pulse  60, respirations 20, blood pressure 125/71, saturating 96% on room air.  CHEST:  Clear to auscultation bilaterally.  ABDOMEN:  Soft, nontender.  EXTREMITIES:  No clubbing.  No signs.  No edema.  CARDIOVASCULAR:  First and second heart sounds only.  CENTRAL NERVOUS SYSTEM:  Nonfocal.   The patient knows the fact that he lives with his son at home and he has  his daughter that comes by work permit to look at him.  He does not want  to go to a skilled nursing facility and thinks  he can do well at home.  We will recommend that the patient should go home with home health with  physical therapy to help with his strengthening.  He is to follow up  with Dr. Artis Flock, his primary care physician in 1-2 weeks, and the patient  is to make this call.  Also, to follow up with Dr. Dorena Cookey in 3-4  weeks.  Phone number has been provided in the instruction paper.  Another followup is with Dr. Lynnea Ferrier of Cardiology in 3 weeks, and the  patient is to make this call.   DISCHARGE MEDICATIONS:  1. Lopressor reduced at 12.5 mg p.o. b.i.d.  2. Plavix 75 mg p.o. daily.  3. Aspirin reduced to 81 mg p.o. daily.  4. Zocor 10 mg p.o. daily.  5. Nu-Iron 150 mg p.o. b.i.d.  6. Sucralfate 1 g p.o. q.i.d.  7. Protonix 40 mg p.o. b.i.d.  8. Folate 2 mg p.o. daily.   Total time used for discharge, 45 minutes.  The plan of care has been  discussed with the patient at length and he voices understanding and  plans to comply.      Ruthy Dick, MD  Electronically Signed     GU/MEDQ  D:  05/20/2009  T:  05/20/2009  Job:  [id number]   cc:   Quita Skye. Artis Flock, M.D.  John C. Madilyn Fireman, M.D.  Ritta Slot, MD

## 2011-02-19 NOTE — Consult Note (Signed)
NAME:  Eric Cooley, Eric Cooley NO.:  [id number]   MEDICAL RECORD NO.:  [id number]          PATIENT TYPE:  INP   LOCATION:  2912                         FACILITY:  [facility]   PHYSICIAN:  John C. Madilyn Fireman, M.D.    DATE OF BIRTH:  [birth year]   DATE OF CONSULTATION:  05/11/2009  DATE OF DISCHARGE:                                 CONSULTATION   REASON FOR CONSULTATION:  Acute GI bleed.   HISTORY OF PRESENT ILLNESS:  The patient is an 77 year old white male  who was admitted with the near syncope and chest discomfort, suspected  to be cardiac in etiology, last night.  This morning, he had nausea and  syncopal episode and vomited bright red blood and also reportedly had  bloody bowel movements.  He has had some hypotension in the short period  since this happened.  His admission hemoglobin was 11.1 and this morning  it is 10.6.  BUN is 45 and creatinine 1 and on admission was 43 with  creatinine 0.8.  The patient has never had any prior GI bleeding.  He  was started on Lovenox and is on Plavix and aspirin while undergoing  rule out MI protocol.  So far, his troponins and CK-MBs have been  normal.   He has a history of a 2-cm gastric tumor which was biopsied and positive  for GI stromal tumor and has been by Dr. Donell Beers.  He has no peptic ulcer  disease.  He has mild reflux symptoms.  He takes no medicines for it.  He has never had any internal hemorrhage before.   PAST MEDICAL HISTORY:  GI stromal tumor, coronary artery disease with  PTCA and stent in place on April 24, 2009, chronic low back pain, and  osteoarthritis.   SURGERIES:  Cholecystectomy.   ALLERGIES:  None known.   MEDICATIONS:  Plavix, aspirin, Zocor, metoprolol, Advil, Coumadin,  Lovenox currently on hold.   PHYSICAL EXAMINATION:  GENERAL:  Pale, in Trendelenburg, alert and  oriented, complaining of very mild chest discomfort.  HEART:  Slight regular tachycardia.  No murmurs, gallops, or rubs.  ABDOMEN:  Soft and  nondistended with normoactive bowel sounds.  No  hepatosplenomegaly, mass, or guarding.   IMPRESSION:  Acute upper gastrointestinal bleed, probably related to  gastrointestinal stromal tumor, rule out other sources.   PLAN:  Emergent endoscopy.  Further recommendations to follow.           ______________________________  Everardo All Madilyn Fireman, M.D.     JCH/MEDQ  D:  05/11/2009  T:  05/11/2009  Job:  [id number]   cc:   Nicki Guadalajara, M.D.  Petra Kuba, M.D.  Almond Lint, MD

## 2011-02-19 NOTE — Group Therapy Note (Signed)
NAME:  Eric Cooley, Eric Cooley NO.:  [id number]   MEDICAL RECORD NO.:  [id number]          PATIENT TYPE:  INP   LOCATION:  2912                         FACILITY:  [facility]   PHYSICIAN:  Beckey Rutter, MD  DATE OF BIRTH:  December 25, 1919                                 PROGRESS NOTE   PRIMARY CARE PHYSICIAN:  Unassigned to [facility].   GASTROENTEROLOGIST:  Dr. Dorena Cookey with [facility].   BRIEF HISTORY OF CURRENT ILLNESS:  [name] is a very pleasant 77 year old admitted with massive GI bleed.  The patient had endoscopy twice per Dr .Madilyn Fireman and recently the patient  was found to have new gastric ulcer probably the source of bleeding  where the patient had gastric embolization earlier in the week.  Please  refer to the operative report on May 10, 2009, and May 13, 2009, for  more details.   The patient had embolization done to the left gastric curvature by  Interventional Radiology.   The patient was admitted to ICU and then transferred to our service.  He  is currently followed up by Cardiology and Gastroenterology.   HOSPITAL PROCEDURES:  1. Chest x-ray on May 10, 2009.  Impression is no acute      cardiopulmonary abnormalities.  2. On May 11, 2009, the patient had chest x-ray showing:      a.     Mild left basilar atelectasis or pneumonia.      b.     Interval mild elevation of the left hemidiaphragm and       possible small left pleural effusion.  3. Chest x-ray on May 12, 2009.  The impression is improved left      basilar aeration with minimal atelectasis remaining.  Improved or      resolved left-sided pleural fluid.  4. The patient had EGD on May 10, 2009.  Impression is massive ulcer      or GI bleeding presumed from known gastrointestinal stromal tumor.      Cannot rule out ulcer.  Interventional Radiology was consulted at      that time and embolization was done to the left gastric artery.  5. EGD on May 13, 2009.  Impression is new  diagnosis of gastric      ulcers very likely the cause of recent bleeding.   CONSULTATION:  1. [facility]/Cardiology.  2. General Surgery.  3. Gastroenterology.   DISCHARGE DIAGNOSES:  1. New gastric ulcer, likely the source of bleeding.  2. Gastrointestinal stromal tumors, status post embolization.  3. Myocardial infarction, status post drug-eluting stent.  Patient      should continue on Plavix and aspirin.  4. Acute blood loss anemia.  Patient still has some continued loss.  5. Hypertension.   DISCHARGE MEDICATION:  Discharge medication will be dictated the day of actual discharge.   PLAN:  The plan for now is to continue monitoring H and H, hence is still  drifting down.  I would keep the hemoglobin more than 9 because of the  recent MI.  The patient will be transferred to step down  today and if he  remained stable he can go to the floor and then released.  We will  continue to check H and H as scheduled.      Beckey Rutter, MD  Electronically Signed     EME/MEDQ  D:  05/16/2009  T:  05/16/2009  Job:  [id number]

## 2011-02-19 NOTE — Discharge Summary (Signed)
NAME:  Eric Cooley, Eric Cooley NO.:  [id number]   MEDICAL RECORD NO.:  [id number]          PATIENT TYPE:  INP   LOCATION:  2906                         FACILITY:  [facility]   PHYSICIAN:  Ruthy Dick, MD    DATE OF BIRTH:  [birth year]   DATE OF ADMISSION:  04/21/2009  DATE OF DISCHARGE:  04/26/2009                               DISCHARGE SUMMARY   REASON FOR ADMISSION:  Chest pain.   FINAL DISCHARGE DIAGNOSES:  1. Non-ST elevation myocardial infarction.  2. Coronary artery disease status post percutaneous coronary      intervention during this admission.  3. Dyslipidemia.  4. Hypertension.  5. Low back pain.   CONSULTS DURING THIS ADMISSION:  Cardiology consult.   PROCEDURES DONE DURING THIS ADMISSION:  Left heart catheterization which  showed 95% occlusion of the LAD and was stented with good results.   BRIEF HISTORY OF PRESENT ILLNESS AND HOSPITAL COURSE:  This is an 77-  year-old gentleman with no significant past medical history who came  into the emergency room complaining of chest pain that started on the  morning prior to presentation.  He was chest pain free by the time he  came to the hospital.  Workup revealed that the patient had elevated  cardiac enzymes and because of this he was selected for a left heart  catheterization.  This was done successfully by Dr. Lynnea Ferrier without  stenting.  The patient has continued to be symptom free since then and  he stabilized in the Intensive Care Unit.  We believe the patient can be  transferred to the telemetry unit today for onward discharge tomorrow if  he remains stable.  Today, he has no complaints whatsoever.  No chest  pain, no shortness of breath, no abdominal pain, no nausea, no vomiting,  no diarrhea, no constipation.  He is to follow up with Dr. Lynnea Ferrier as  scheduled by [facility].  He is also to follow with  primary care physician, Dr. Bradd Canary in 1-2 weeks.  The patient is to  call  for this appointment.  He has been advised to return to the  emergency room for any recurrence of worsening symptoms.   DISCHARGE MEDICATIONS:  1. Plavix 75 mg daily.  2. Aspirin 325 mg daily.  3. Zocor 10 mg p.o. at bedtime.  4. Metoprolol 25 mg p.o. b.i.d.   TIME USED FOR DISCHARGE PLAN:  Greater than 30 minutes.   PHYSICAL EXAMINATION:  VITAL SIGNS:  Temperature 97.6, pulse 60,  respiration 13, blood pressure 110/48 and saturating 98% on room air.  CHEST:  Clear to auscultation bilaterally.  ABDOMEN:  Soft, nontender.  EXTREMITIES:  No clubbing, no cyanosis, no edema.  CARDIOVASCULAR:  First and second heart sounds only.  CENTRAL NERVOUS SYSTEM:  Nonfocal.  In addition, the patient denies  dysuria, frequency, urgency.  Denies syncope, seizure denies  photophobia.      Ruthy Dick, MD  Electronically Signed     GU/MEDQ  D:  04/25/2009  T:  04/26/2009  Job:  [id number]   cc:   Ritta Slot, MD  Quita Skye Artis Flock, M.D.

## 2011-02-19 NOTE — Consult Note (Signed)
NAME:  Eric Cooley, Eric Cooley NO.:  [id number]   MEDICAL RECORD NO.:  [id number]          PATIENT TYPE:  INP   LOCATION:  2912                         FACILITY:  [facility]   PHYSICIAN:  Lennie Muckle, MD      DATE OF BIRTH:  [birth year]   DATE OF CONSULTATION:  05/11/2009  DATE OF DISCHARGE:                                 CONSULTATION   TIME OF CONSULTATION:  15:52 p.m.   REQUESTING PHYSICIAN:  John C. Madilyn Fireman, MD, Gastroenterology   PRIMARY GASTROENTEROLOGIST:  Petra Kuba, MD   CONSULTING SURGEON:  Amber L. Freida Busman, MD   PRIMARY SURGEON:  Almond Lint, MD   PRIMARY CARDIOLOGIST:  Ritta Slot, MD   PRIMARY CARE PHYSICIAN:  Dr. Penni Bombard.   REASON FOR CONSULTATION:  Upper GI bleed secondary to bleeding GIS  tumor.   HISTORY OF PRESENT ILLNESS:  Eric Cooley is an 77 year old white male with  history of pancreatic cancer who was not received treatment for this  along with coronary artery disease with a blocked LAD with a recent drug-  eluting stents placed on April 24, 2009, for which the patient is  currently now on Plavix.  While at home yesterday, the patient began  having chest pain.  He then had near syncopal episodes and pretty down  due to vagal response.  At that time, 9-1-1 was called to bring the  patient to the emergency department, where he was admitted by  Cardiology.  This morning due to continued chest pain and near syncopal  episode, Dr. Lynnea Ferrier was getting ready to redo a cardiac  catheterization, but then the patient began having hematemesis.  At this  time due to unknown GIS tumor, Gastroenterology was consulted and Dr.  Madilyn Fireman did a upper endoscopy and found GI bleed secondary to the GIS  tumor.  At that time, he was unable to get the bleeding stopped  endoscopically and the patient was then sent to Interventional Radiology  where 4 coils were placed in the left gastric artery and cessation of  bleeding occurred.  At this time, we were asked to see the  patient  regarding rebleeding issues.  Of note, the patient states that over the  last couple of weeks, he has had some nausea after meals, but no emesis  and he has been feeling slightly bloated and for after eating recently  as well.  He does see Dr. Donell Beers as an outpatient for this GIS tumor.  I  will have to obtain office notes from our office.  However, he did  recently had a CT scan of the abdomen and pelvis to follow up on this  gastric mass in February of this year.   REVIEW OF SYSTEMS:  Please see HPI.  Otherwise currently all other  systems are negative.   PAST MEDICAL HISTORY:  1. Low back pain.  2. Non-ST elevation myocardial infarction.  3. Coronary artery disease, status post drug-eluting stent placement      in the LAD.  4. Hypertension.   PAST SURGICAL HISTORY:  1. Open cholecystectomy.  2. Tonsillectomy.  SOCIAL HISTORY:  The patient quit smoking approximately 30 years ago.  He denies alcohol or drug abuse.  Two of his daughters are currently at  the bedside.   ALLERGIES:  NKDA.   MEDICATIONS:  Ibuprofen on the p.r.n. basis.   PHYSICAL EXAMINATION:  GENERAL:  Eric Cooley is a very pleasant 77-year-  old white male, who is currently in bed in no acute distress.  VITAL SIGNS:  Pulse 97, respirations 22, blood pressure 119/61, and  temperature is99.  HEENT:  Head is normocephalic, atraumatic.  Sclerae noninjected.  Pupils  are equal, round, and reactive to light.  Ears and nose without any  obvious masses or lesions.  However, he does have an NG tube in place  with bloody output.  Otherwise, mouth is pink and he does have some  dried blood on his lips.  Throat shows no exudate.  NECK:  Supple.  Trachea is midline.  HEART:  Regular rate and rhythm.  Normal S1 and S2.  No murmurs,  gallops, or rubs noted, +2 carotid, radial, pedal pulses bilaterally.  LUNGS:  Clear to auscultation bilaterally with no wheezes, rhonchi, or  rales noted.  Respiratory effort is  nonlabored.  ABDOMEN:  Soft, nontender, nondistended with active bowel sounds.  No  masses or hernias are noted.  However, he does have a right upper  quadrant oblique scar from his prior open cholecystectomy.  RECTAL:  Deferred at this time.  MUSCULOSKELETAL:  All 4 extremities are symmetrical.  No cyanosis,  clubbing or edema.  SKIN:  Warm and dry without any obvious masses, lesions or rashes.  NEUROLOGIC:  Cranial nerves II-XII appeared to be grossly intact.  PSYCHIATRIC:  The patient is alert and oriented x3 with an appropriate  affect.   LABORATORY DATA AND DIAGNOSTICS:  Cardiac panel is negative.  His recent  hemoglobin is 13.1, hematocrit 38.6.  Prior hemoglobin was 9.8 with a  hematocrit of 29.0, white count is 11,100, and platelet count is 197.  Sodium 140, potassium 3.8, glucose 124, BUN 48, and creatinine 1.2.  The  two-view chest x-ray shows no acute cardiopulmonary abnormalities.  Upper GI shows a large GIS tumor that is bleeding.   IMPRESSION:  1. Just tumor.  2. Acute blood loss anemia secondary to upper gastrointestinal bleed      from this gastrointestinal stromal tumor.  3. Coronary artery disease, status post drug-eluting stent placement      on April 24, 2009, which the patient is currently on Plavix.  4. Non-ST segment elevated myocardial infarction in the last month.  5. History of prostate cancer for which he has never been treated.   PLAN:  At this time, we will follow along with you.  The patient has  currently stopped bleeding secondary to coiling by Interventional  Radiology.  If the patient repeats tonight, we will recommend another  attempt by Interventional Radiology due to the patient is currently  being on Plavix and unable to stop with secondary to the risk of an MI  per Dr. Lynnea Ferrier, who is his cardiologist.  Also of concern is that the  patient has also recently had a myocardial infarction within the last  month.  This does make him a  moderate-to-high risk surgical patient.  However, if the patient does re-bleed and all other attempts failed and  the patient and family are willing to proceed with surgical intervention  despite a dramatic increase in the risk of bleeding due to the Plavix,  then  we will be available if necessary.  Otherwise, we will continue to  follow along with you this.      Letha Cape, PA      Lennie Muckle, MD  Electronically Signed    KEO/MEDQ  D:  05/11/2009  T:  05/12/2009  Job:  [id number]   cc:   Everardo All. Madilyn Fireman, M.D.  Petra Kuba, M.D.  Lennie Muckle, MD  Almond Lint, MD  Ritta Slot, MD  Dr. Penni Bombard  Dr. Delford Field, Critical Care Medicine

## 2011-02-19 NOTE — Op Note (Signed)
NAME:  Eric Cooley, Eric Cooley NO.:  [id number]   MEDICAL RECORD NO.:  [id number]          PATIENT TYPE:  INP   LOCATION:  2912                         FACILITY:  [facility]   PHYSICIAN:  John C. Madilyn Cooley, M.D.    DATE OF BIRTH:  12/06/1919   DATE OF PROCEDURE:  05/13/2009  DATE OF DISCHARGE:                               OPERATIVE REPORT   PROCEDURE:  Esophagogastroduodenoscopy.   INDICATIONS FOR PROCEDURE:  The patient with massive GI bleeding 2 days  ago, presumably from GIST tumor, status post embolization of the left  gastric artery after active bleeding shown on the arteriography.   DESCRIPTION OF PROCEDURE:  The patient was placed in the left lateral  decubitus position and placed on the pulse monitor with continuous low-  flow oxygen delivered by nasal cannula.  He was sedated with 25 mcg IV  fentanyl and 2 mg IV Versed.  Olympus video endoscope was advanced under  direct vision into the oropharynx and esophagus.  The esophagus was  straight and of normal caliber with the squamocolumnar line at 38 cm.  There was no visible hiatal hernia, ring, stricture, or other  abnormality of the GE junction.  The stomach was entered and there was a  moderate, but lesser body of fairly organized clots and some bloody  fluid in the fundus and body, much less than the procedure 2 days ago.  The distal stomach and duodenum was quickly examined and appeared to be  within normal limits.  The scope was withdrawn back into the stomach and  across from the gastric lake.  There was a dirty-based broad ulcer along  the lesser curvature of the body proximally 1.5 cm x 8 mm.  There was no  stigma of hemorrhage associated with it and no obvious visible neoplasm.  This was surrounded by a large area of pinkish blanched mucosa  consistent with previous embolization of that area.  However, this did  not have the appearance of a GIST tumor as described on CT scan.  After  a lot of the old clots were  suctioned through the double-channel scope  with the assistance of the BioVac, a reasonable view of fundus was  obtained.  Finally, a retroflexed view of cardia was done, and this  showed a smooth bilobulated mass consistent with known GIST tumor.  It  did not appear to have any stigma of hemorrhage associated with it.  No  biopsies were taken.  The scope was then withdrawn, and the patient  returned to the recovery room in stable condition.  He tolerated the  procedure well and there were no immediate complications.   IMPRESSION:  1. New diagnosis of gastric ulcer, very likely the cause of his recent      bleed as evidenced by the blanched appearance of the mucosa on all      sides of consistent with ischemic changes from embolization  2. 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   PLAN:  Aggressive antiulcer therapy with PPI, proton pump inhibitor, and  we will check H. pylori status.  Transfuse to keep hemoglobin greater  than 9.5.           ______________________________  Eric Cooley, M.D.    JCH/MEDQ  D:  05/13/2009  T:  05/13/2009  Job:  [id number]   cc:   Nanetta Batty, M.D.

## 2011-02-19 NOTE — Op Note (Signed)
NAME:  Eric Cooley, Eric Cooley NO.:  [id number]   MEDICAL RECORD NO.:  [id number]          PATIENT TYPE:  INP   LOCATION:  2912                         FACILITY:  [facility]   PHYSICIAN:  John C. Madilyn Fireman, M.D.    DATE OF BIRTH:  [birth year]   DATE OF PROCEDURE:  05/10/2009  DATE OF DISCHARGE:                               OPERATIVE REPORT   INDICATIONS FOR PROCEDURE:  Massive upper GI bleeding.   PROCEDURE:  The patient was placed in the left lateral decubitus  position and placed on the pulse monitor with continuous low-flow oxygen  delivered by nasal cannula.  He was sedated with 25 mcg of IV fentanyl  and 2.5 mg of IV Versed.  The Olympus video endoscope was advanced under  direct vision into the oropharynx and esophagus.  The esophagus was  straight and of normal caliber with the squamocolumnar line at 38 cm.  There was no visible bleeding source within the esophagus.  Stomach was  entered, and there was a large amount of bright red blood in the  dependent portion of the fundus.  Scope was advanced beyond it, and  inspection of the nondependent portions of the body and antrum was made  and with fairly good visualization appeared to be no bleeding lesions.  The duodenum was entered.  Both the bulb and second portion were  inspected and appeared to be within normal limits.  Scope was withdrawn  back into the stomach in the area of the pooled blood.  I used a high  powered  BioVac suction device to try to suction out the blood which was  partly successful.  Overall, the size of the pool of blood did not  decrease even after suctioning up to 500 mL of bright red blood and  clots.  It was felt that bleeding was likely too massive for endoscopic  visualization or attempt at tamponade of the nonbleeding site, and after  prolonged inability to adequately visualize the presumed underlying  tumor that was bleeding, the procedure was terminated.  I discussed with  Dr. Lynnea Ferrier we are  going to consult interventional radiology to see if  they can do an arterial embolization in an attempt to top the bleeding.  The patient tolerated the procedure well, and there were no immediate  complications.   IMPRESSION:  Massive upper GI bleeding presumed from known  gastrointestinal stromal tumor, cannot rule out an ulcer.   PLAN:  Interventional radiology consult for embolization.           ______________________________  Everardo All Madilyn Fireman, M.D.     JCH/MEDQ  D:  05/11/2009  T:  05/11/2009  Job:  [id number]

## 2011-02-19 NOTE — H&P (Signed)
NAME:  Eric Cooley, Eric Cooley NO.:  [id number]   MEDICAL RECORD NO.:  [id number]          PATIENT TYPE:  EMS   LOCATION:  MAJO                         FACILITY:  [facility]   PHYSICIAN:  Eduard Clos, MDDATE OF BIRTH:  May 31, 1920   DATE OF ADMISSION:  04/21/2009  DATE OF DISCHARGE:                              HISTORY & PHYSICAL   PATIENT'S PRIMARY CARE PHYSICIAN:  Quita Skye. Kindl, M.D.   CHIEF COMPLAINT:  Chest pain.   HISTORY OF PRESENT ILLNESS:  A 77 year old male with no significant past  medical history presented with complaint of chest pain which started  yesterday morning was retrosternal, lasted about an hour.  Presently,  chest pain-free.  The pain is more like a pressure-like, sometimes  radiating to the left arm.  It has no relation to exertion today, but  the patient has been having similar episode two to three times starting  off in January.  At those times, the patient did have symptoms with  exertion.  The patient had similar symptom a week ago when he had duel  with his son who moved into his house now and he is a little annoyed  that he is not working.   The patient denies any shortness of breath and did have some nausea and  diaphoresis.  Denies any fever or chills.  Denies any palpitation,  dizziness, loss of consciousness, focal deficits, headache, abdominal  pain or dysuria or discharge.   PAST MEDICAL HISTORY:  Low back pain.   PAST SURGICAL HISTORY:  Cholecystectomy.   MEDICATIONS PRIOR TO ADMISSION:  1. Aleve p.r.n. for low back pain.  2. Eye care drop.   ALLERGIES:  NO KNOWN DRUG ALLERGIES.   FAMILY HISTORY:  Brother has hypertension.   SOCIAL HISTORY:  The patient quit smoking 30 years ago.  Denies any  alcohol or drug abuse.   REVIEW OF SYSTEMS:  As per history of present illness.  Nothing else  significant.   PHYSICAL EXAMINATION:  The patient seen at bedside and not in acute  distress.  VITAL SIGNS:  Blood pressure 170/60,  pulse 70 per minute, temperature  98.1, respirations 18, O2 SAT 100%.  HEENT:  Anicteric.  No pallor.  CHEST:  Bilateral air entry present.  No rhonchi.  No crepitation.  HEART:  S1 and S2 heard.  ABDOMEN:  Soft, nontender.  Bowel sounds heard.  CNS:  Alert and oriented to time, place and person.  Moves upper and lower extremities, 5/5.  Lower Extremities:  Peripheral  pulses well with no edema.   LABS:  Chest x-ray:  Nothing acute.  EKG:  Normal sinus rhythm with no  acute ST-T changes.  CBC:  WBC 5.8, hemoglobin 13.4, hematocrit 39,  platelets 175.  PT/INR 13.1 and 1.  Basic metabolic panel:  Sodium 140,  potassium 3.9, chloride 106, carbon dioxide 25, glucose 111, BUN 14,  creatinine 0.9, calcium 9.  Troponin-I less than 0.05.   ASSESSMENT:  1. Chest pain, rule-out acute coronary syndrome.  2. Possible new onset hypertension.  3. Mild hyperglycemia.   PLAN:  1. We will  admit the patient to telemetry.  2. We will cycle cardiac markers.  3. Check hemoglobin A1c, fasting lipid panel and TSH.  4. We will get a cardiology consult given the fact that the patient      did have some times exertional chest pain.  We will follow      cardiology recommendation.  5. Further recommendations as his condition evolves.      Eduard Clos, MD  Electronically Signed     ANK/MEDQ  D:  04/21/2009  T:  04/21/2009  Job:  [id number]   cc:   Quita Skye. Artis Flock, M.D.

## 2011-03-28 ENCOUNTER — Emergency Department (HOSPITAL_COMMUNITY)
Admission: EM | Admit: 2011-03-28 | Discharge: 2011-03-29 | Disposition: A | Discharge status: Home / Self Care | Payer: PRIVATE HEALTH INSURANCE | Attending: Emergency Medicine | Admitting: Emergency Medicine

## 2011-03-28 DIAGNOSIS — K219 Gastro-esophageal reflux disease without esophagitis: Secondary | ICD-10-CM | POA: Insufficient documentation

## 2011-03-28 DIAGNOSIS — E789 Disorder of lipoprotein metabolism, unspecified: Secondary | ICD-10-CM | POA: Insufficient documentation

## 2011-03-28 DIAGNOSIS — R5381 Other malaise: Secondary | ICD-10-CM | POA: Insufficient documentation

## 2011-03-28 DIAGNOSIS — Z7982 Long term (current) use of aspirin: Secondary | ICD-10-CM | POA: Insufficient documentation

## 2011-03-28 DIAGNOSIS — Z8546 Personal history of malignant neoplasm of prostate: Secondary | ICD-10-CM | POA: Insufficient documentation

## 2011-03-28 DIAGNOSIS — I2510 Atherosclerotic heart disease of native coronary artery without angina pectoris: Secondary | ICD-10-CM | POA: Insufficient documentation

## 2011-03-28 DIAGNOSIS — R42 Dizziness and giddiness: Secondary | ICD-10-CM | POA: Insufficient documentation

## 2011-03-28 DIAGNOSIS — I10 Essential (primary) hypertension: Secondary | ICD-10-CM | POA: Insufficient documentation

## 2011-03-28 DIAGNOSIS — Z79899 Other long term (current) drug therapy: Secondary | ICD-10-CM | POA: Insufficient documentation

## 2011-03-28 HISTORY — DX: Benign prostatic hyperplasia without lower urinary tract symptoms: N40.0

## 2011-03-28 HISTORY — DX: Essential (primary) hypertension: I10

## 2011-03-28 HISTORY — DX: Gastro-esophageal reflux disease without esophagitis: K21.9

## 2011-03-28 HISTORY — DX: Atherosclerotic heart disease of native coronary artery without angina pectoris: I25.10

## 2011-03-28 HISTORY — DX: Other secondary thrombocytopenia: D69.59

## 2011-03-28 HISTORY — DX: Allergic rhinitis, unspecified: J30.9

## 2011-03-28 HISTORY — DX: Anemia, unspecified: D64.9

## 2011-03-28 HISTORY — DX: Malignant neoplasm of prostate: C61

## 2011-03-28 HISTORY — DX: Acquired absence of other specified parts of digestive tract: Z90.49

## 2011-03-28 HISTORY — DX: Hyperlipidemia, unspecified: E78.5

## 2011-03-28 HISTORY — DX: Acute myocardial infarction, unspecified: I21.9

## 2011-03-29 ENCOUNTER — Encounter: Payer: Self-pay | Admitting: Internal Medicine

## 2011-03-29 ENCOUNTER — Encounter (HOSPITAL_COMMUNITY): Payer: Self-pay | Admitting: Radiology

## 2011-03-29 ENCOUNTER — Emergency Department (HOSPITAL_COMMUNITY): Payer: PRIVATE HEALTH INSURANCE

## 2011-03-29 DIAGNOSIS — D6959 Other secondary thrombocytopenia: Secondary | ICD-10-CM

## 2011-03-29 DIAGNOSIS — I10 Essential (primary) hypertension: Secondary | ICD-10-CM | POA: Insufficient documentation

## 2011-03-29 DIAGNOSIS — D649 Anemia, unspecified: Secondary | ICD-10-CM | POA: Insufficient documentation

## 2011-03-29 DIAGNOSIS — E785 Hyperlipidemia, unspecified: Secondary | ICD-10-CM | POA: Insufficient documentation

## 2011-03-29 DIAGNOSIS — J309 Allergic rhinitis, unspecified: Secondary | ICD-10-CM

## 2011-03-29 DIAGNOSIS — N400 Enlarged prostate without lower urinary tract symptoms: Secondary | ICD-10-CM

## 2011-03-29 DIAGNOSIS — C61 Malignant neoplasm of prostate: Secondary | ICD-10-CM | POA: Insufficient documentation

## 2011-03-29 DIAGNOSIS — D696 Thrombocytopenia, unspecified: Secondary | ICD-10-CM | POA: Insufficient documentation

## 2011-03-29 DIAGNOSIS — K219 Gastro-esophageal reflux disease without esophagitis: Secondary | ICD-10-CM

## 2011-03-29 DIAGNOSIS — R42 Dizziness and giddiness: Secondary | ICD-10-CM | POA: Insufficient documentation

## 2011-03-29 DIAGNOSIS — I2510 Atherosclerotic heart disease of native coronary artery without angina pectoris: Secondary | ICD-10-CM | POA: Insufficient documentation

## 2011-03-29 DIAGNOSIS — Z9049 Acquired absence of other specified parts of digestive tract: Secondary | ICD-10-CM

## 2011-03-29 DIAGNOSIS — Z0000 Encounter for general adult medical examination without abnormal findings: Secondary | ICD-10-CM | POA: Insufficient documentation

## 2011-03-29 DIAGNOSIS — Z86018 Personal history of other benign neoplasm: Secondary | ICD-10-CM | POA: Insufficient documentation

## 2011-03-29 DIAGNOSIS — Z903 Acquired absence of stomach [part of]: Secondary | ICD-10-CM | POA: Insufficient documentation

## 2011-03-29 HISTORY — DX: Benign prostatic hyperplasia without lower urinary tract symptoms: N40.0

## 2011-03-29 HISTORY — DX: Other secondary thrombocytopenia: D69.59

## 2011-03-29 HISTORY — DX: Essential (primary) hypertension: I10

## 2011-03-29 HISTORY — DX: Malignant neoplasm of prostate: C61

## 2011-03-29 HISTORY — DX: Hyperlipidemia, unspecified: E78.5

## 2011-03-29 HISTORY — DX: Acquired absence of other specified parts of digestive tract: Z90.49

## 2011-03-29 HISTORY — DX: Gastro-esophageal reflux disease without esophagitis: K21.9

## 2011-03-29 HISTORY — DX: Allergic rhinitis, unspecified: J30.9

## 2011-03-29 LAB — BASIC METABOLIC PANEL
BUN: 19 mg/dL (ref 6–23)
CO2: 27 mEq/L (ref 19–32)
Glucose, Bld: 85 mg/dL (ref 70–99)
Potassium: 3.9 mEq/L (ref 3.5–5.1)
Sodium: 140 mEq/L (ref 135–145)

## 2011-03-29 LAB — DIFFERENTIAL
Basophils Relative: 0 % (ref 0–1)
Lymphocytes Relative: 27 % (ref 12–46)
Monocytes Absolute: 0.5 10*3/uL (ref 0.1–1.0)
Monocytes Relative: 9 % (ref 3–12)
Neutro Abs: 3.5 10*3/uL (ref 1.7–7.7)
Neutrophils Relative %: 63 % (ref 43–77)

## 2011-03-29 LAB — CBC
HCT: 40.8 % (ref 39.0–52.0)
Hemoglobin: 14.2 g/dL (ref 13.0–17.0)
MCH: 32.9 pg (ref 26.0–34.0)
RBC: 4.32 MIL/uL (ref 4.22–5.81)

## 2011-03-29 MED ORDER — GADOBENATE DIMEGLUMINE 529 MG/ML IV SOLN: 15.0000 mL | Freq: Once | INTRAVENOUS | Status: DC

## 2011-04-01 ENCOUNTER — Encounter: Payer: Self-pay | Admitting: Internal Medicine

## 2011-04-01 ENCOUNTER — Other Ambulatory Visit (INDEPENDENT_AMBULATORY_CARE_PROVIDER_SITE_OTHER): Payer: 59

## 2011-04-01 ENCOUNTER — Ambulatory Visit (INDEPENDENT_AMBULATORY_CARE_PROVIDER_SITE_OTHER): Payer: 59 | Admitting: Internal Medicine

## 2011-04-01 VITALS — BP 132/64 | HR 74 | Temp 97.7°F | Ht 67.0 in | Wt 133.5 lb

## 2011-04-01 DIAGNOSIS — Z0000 Encounter for general adult medical examination without abnormal findings: Secondary | ICD-10-CM

## 2011-04-01 DIAGNOSIS — Z23 Encounter for immunization: Secondary | ICD-10-CM

## 2011-04-01 DIAGNOSIS — Z79899 Other long term (current) drug therapy: Secondary | ICD-10-CM

## 2011-04-01 DIAGNOSIS — M47816 Spondylosis without myelopathy or radiculopathy, lumbar region: Secondary | ICD-10-CM | POA: Insufficient documentation

## 2011-04-01 DIAGNOSIS — D649 Anemia, unspecified: Secondary | ICD-10-CM

## 2011-04-01 DIAGNOSIS — M5137 Other intervertebral disc degeneration, lumbosacral region: Secondary | ICD-10-CM

## 2011-04-01 DIAGNOSIS — I10 Essential (primary) hypertension: Secondary | ICD-10-CM

## 2011-04-01 DIAGNOSIS — R42 Dizziness and giddiness: Secondary | ICD-10-CM

## 2011-04-01 HISTORY — DX: Spondylosis without myelopathy or radiculopathy, lumbar region: M47.816

## 2011-04-01 LAB — VITAMIN B12: Vitamin B-12: 780 pg/mL (ref 211–911)

## 2011-04-01 LAB — FOLATE: Folate: >24.8 ng/mL (ref >5.9)

## 2011-04-01 MED ORDER — TETANUS-DIPHTH-ACELL PERTUSSIS 5-2.5-18.5 LF-MCG/0.5 IM SUSP
0.5000 mL | Freq: Once | INTRAMUSCULAR | Status: AC
  Administered 2011-04-01: 0.5 mL via INTRAMUSCULAR

## 2011-04-01 NOTE — Progress Notes (Signed)
Quick Note:  Voice message left on PhoneTree system - lab is negative, normal or otherwise stable, pt to continue same tx ______ 

## 2011-04-01 NOTE — Patient Instructions (Signed)
You had the tetanus shot today Please call your insurance to see if you can have the shingles shot;  If you are covered, you can call for Nurse Appt for the shot only Please go to LAB in the Basement for the blood and/or urine tests to be done today - the b12 and folate Please call the phone number [phone] (the PhoneTree System) for results of testing in 2-3 days;  When calling, simply dial the number, and when prompted enter the MRN number above (the Medical Record Number) and the # key, then the message should start. You can take tylenol arthritis as needed for the lower back pain Continue all other medications as before Please avoid anti-inflammatories as you do Please keep your appointments with your specialists as you have planned - Dr Patsi Sears Please return in 6 mo with Lab testing done 3-5 days before

## 2011-04-01 NOTE — Assessment & Plan Note (Signed)
Improved, to consider allegra otc prn,  to f/u any worsening symptoms or concerns

## 2011-04-01 NOTE — Assessment & Plan Note (Signed)
stable overall by hx and exam, , and pt to continue medical treatment as before, consider add tylenol arthritis prn

## 2011-04-01 NOTE — Assessment & Plan Note (Signed)
stable overall by hx and exam, most recent data reviewed with pt, and pt to continue medical treatment as before  BP Readings from Last 3 Encounters:  04/01/11 132/64

## 2011-04-01 NOTE — Progress Notes (Signed)
Subjective:    Patient ID: Eric Cooley, male    DOB: 17-Jun-1920, 77 y.o.   MRN: [id number]  HPI  Here to establish , was seen at urgent care with episode of dizziness , with fluid to the right ear , and given rx for prednisone which he did not take.  Also Does have several wks ongoing nasal allergy symptoms with clear congestion, itch and sneeze, without fever, pain, ST, cough or wheezing.  Did also have ? Low plt but went to ER and plt was normal. Pt denies chest pain, increased sob or doe, wheezing, orthopnea, PND, increased LE swelling, palpitations, dizziness or syncope. Pt denies new neurological symptoms such as new headache, or facial or extremity weakness or numbness    Pt denies polydipsia, polyuria.  No new complaints .  Does  continues to have recurring LBP without change in severity, bowel or bladder change, fever, wt loss,  worsening LE pain/numbness/weakness, gait change or falls.  Past Medical History  Diagnosis Date  . Hypertension   . MI (myocardial infarction)   . 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 CAD (coronary artery disease) 03/29/2011  . Prostate cancer 03/29/2011  . HTN (hypertension) 03/29/2011  . Hyperlipidemia 03/29/2011  . BPH (benign prostatic hypertrophy) 03/29/2011  . GERD (gastroesophageal reflux disease) 03/29/2011  . S/P cholecystectomy 03/29/2011  . 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 Thrombocytopenia 03/29/2011  . Dizziness - giddy 03/29/2011  . Allergic rhinitis, cause unspecified 03/29/2011  . S/P partial gastrectomy 03/29/2011  . Anemia 03/29/2011  . Arthritis   . Ulcer   . Urine incontinence   . Low back pain   . DJD (degenerative joint disease), lumbar 04/01/2011   Past Surgical History  Procedure Date  . Heart stent 2010  . Tonsillectomy 1951  . Cholecystectomy 1986  . Stromal tumor     reports that he has quit smoking. He does not have any smokeless tobacco history on file. He reports that he drinks  alcohol. He reports that he does not use illicit drugs. family history includes Arthritis in his father and mother and Cancer in his mother and other. No Known Allergies No current outpatient prescriptions on file prior to visit.   No current facility-administered medications on file prior to visit.   Review of Systems Review of Systems  Constitutional: Negative for diaphoresis and unexpected weight change.  HENT: Negative for drooling and tinnitus.   Eyes: Negative for photophobia and visual disturbance.  Respiratory: Negative for choking and stridor.   Gastrointestinal: Negative for vomiting and blood in stool.  Genitourinary: Negative for hematuria and decreased urine volume.     Objective:   Physical Exam BP 132/64  Pulse 74  Temp(Src) 97.7 F (36.5 C) (Oral)  Ht 5\' 7"  (1.702 m)  Wt 133 lb 8 oz (60.555 kg)  BMI 20.91 kg/m2  SpO2 94% Physical Exam  VS noted Constitutional: Pt appears well-developed and well-nourished.  HENT: Head: Normocephalic.  Right Ear: External ear normal.  Left Ear: External ear normal.  Eyes: Conjunctivae and EOM are normal. Pupils are equal, round, and reactive to light.  Neck: Normal range of motion. Neck supple.  Cardiovascular: Normal rate and regular rhythm.   Pulmonary/Chest: Effort normal and breath sounds normal.  Abd:  Soft, NT, non-distended, + BS Neurological: Pt is alert. No cranial nerve deficit.  Skin: Skin is warm. No erythema.  Psychiatric: Pt behavior is normal. Thought content normal.  Lumbar tender low lumbar midline  without swelling, erythema, rash       Assessment & Plan:

## 2011-04-01 NOTE — Assessment & Plan Note (Signed)
Per pt - also for b12/folate today,  to f/u any worsening symptoms or concerns

## 2011-10-10 ENCOUNTER — Encounter: Payer: Self-pay | Admitting: Internal Medicine

## 2011-10-10 ENCOUNTER — Ambulatory Visit (INDEPENDENT_AMBULATORY_CARE_PROVIDER_SITE_OTHER): Payer: 59 | Admitting: Internal Medicine

## 2011-10-10 ENCOUNTER — Other Ambulatory Visit (INDEPENDENT_AMBULATORY_CARE_PROVIDER_SITE_OTHER): Payer: 59

## 2011-10-10 ENCOUNTER — Other Ambulatory Visit: Payer: Self-pay | Admitting: Internal Medicine

## 2011-10-10 VITALS — BP 110/62 | HR 64 | Temp 97.8°F | Ht 67.0 in | Wt 135.5 lb

## 2011-10-10 DIAGNOSIS — Z23 Encounter for immunization: Secondary | ICD-10-CM

## 2011-10-10 DIAGNOSIS — B351 Tinea unguium: Secondary | ICD-10-CM

## 2011-10-10 DIAGNOSIS — Z0000 Encounter for general adult medical examination without abnormal findings: Secondary | ICD-10-CM

## 2011-10-10 DIAGNOSIS — M5137 Other intervertebral disc degeneration, lumbosacral region: Secondary | ICD-10-CM

## 2011-10-10 DIAGNOSIS — L84 Corns and callosities: Secondary | ICD-10-CM

## 2011-10-10 DIAGNOSIS — M51379 Other intervertebral disc degeneration, lumbosacral region without mention of lumbar back pain or lower extremity pain: Secondary | ICD-10-CM

## 2011-10-10 DIAGNOSIS — M47816 Spondylosis without myelopathy or radiculopathy, lumbar region: Secondary | ICD-10-CM

## 2011-10-10 DIAGNOSIS — Z79899 Other long term (current) drug therapy: Secondary | ICD-10-CM

## 2011-10-10 LAB — CBC WITH DIFFERENTIAL/PLATELET
Eosinophils Absolute: 0 10*3/uL (ref 0.0–0.7)
MCHC: 33.4 g/dL (ref 30.0–36.0)
MCV: 100.6 fl — ABNORMAL HIGH (ref 78.0–100.0)
Monocytes Absolute: 0.3 10*3/uL (ref 0.1–1.0)
Neutrophils Relative %: 77.2 % — ABNORMAL HIGH (ref 43.0–77.0)
Platelets: 188 10*3/uL (ref 150.0–400.0)
RDW: 15.2 % — ABNORMAL HIGH (ref 11.5–14.6)

## 2011-10-10 LAB — URINALYSIS, ROUTINE W REFLEX MICROSCOPIC
Bilirubin Urine: NEGATIVE
Ketones, ur: NEGATIVE
Leukocytes, UA: NEGATIVE
Nitrite: NEGATIVE

## 2011-10-10 LAB — HEPATIC FUNCTION PANEL
ALT: 24 U/L (ref 0–53)
AST: 27 U/L (ref 0–37)
Alkaline Phosphatase: 61 U/L (ref 39–117)
Bilirubin, Direct: 0.1 mg/dL (ref 0.0–0.3)
Total Bilirubin: 0.4 mg/dL (ref 0.3–1.2)

## 2011-10-10 LAB — BASIC METABOLIC PANEL
BUN: 29 mg/dL — ABNORMAL HIGH (ref 6–23)
Chloride: 106 mEq/L (ref 96–112)
Potassium: 4.6 mEq/L (ref 3.5–5.1)

## 2011-10-10 LAB — TSH: TSH: 3.92 u[IU]/mL (ref 0.35–5.50)

## 2011-10-10 LAB — LDL CHOLESTEROL, DIRECT: Direct LDL: 56.7 mg/dL

## 2011-10-10 LAB — LIPID PANEL: VLDL: 16.8 mg/dL (ref 0.0–40.0)

## 2011-10-10 MED ORDER — PNEUMOCOCCAL VAC POLYVALENT 25 MCG/0.5ML IJ INJ: 0.5000 mL | INJECTION | Freq: Once | INTRAMUSCULAR | Status: DC

## 2011-10-10 MED ORDER — TRAMADOL HCL 50 MG PO TABS
50.0000 mg | ORAL_TABLET | Freq: Four times a day (QID) | ORAL | Status: DC | PRN
Start: 2011-10-10 — End: 2012-01-14

## 2011-10-10 MED ORDER — ACETAMINOPHEN ER 650 MG PO TBCR: 650.0000 mg | EXTENDED_RELEASE_TABLET | Freq: Three times a day (TID) | ORAL | Status: AC | PRN

## 2011-10-10 NOTE — Assessment & Plan Note (Signed)
Overall doing well, age appropriate education and counseling updated, referrals for preventative services and immunizations addressed, dietary and smoking counseling addressed, most recent labs and ECG reviewed.  I have personally reviewed and have noted: 1) the patient's medical and social history 2) The pt's use of alcohol, tobacco, and illicit drugs 3) The patient's current medications and supplements 4) Functional ability including ADL's, fall risk, home safety risk, hearing and visual impairment 5) Diet and physical activities 6) Evidence for depression or mood disorder 7) The patient's height, weight, and BMI have been recorded in the chart I have made referrals, and provided counseling and education based on review of the above For flu shot, pneumonia shot today

## 2011-10-10 NOTE — Patient Instructions (Addendum)
You had the flu shot and the pneumonia shots today You can take the store brand Tylenol 8 hr up to three times per day for the back pain Take all new medications as prescribed - the stronger pain medication only if needed Please go to LAB in the Basement for the blood and/or urine tests to be done today Please call the phone number [phone] (the PhoneTree System) for results of testing in 2-3 days;  When calling, simply dial the number, and when prompted enter the MRN number above (the Medical Record Number) and the # key, then the message should start. You will be contacted regarding the referral for: podiatry for the nail and callous Please return in 6 months, or sooner if needed

## 2011-10-13 ENCOUNTER — Encounter: Payer: Self-pay | Admitting: Internal Medicine

## 2011-10-13 DIAGNOSIS — L84 Corns and callosities: Secondary | ICD-10-CM | POA: Insufficient documentation

## 2011-10-13 NOTE — Assessment & Plan Note (Signed)
Mild, no neuro change, for tylenol otc prn

## 2011-10-13 NOTE — Progress Notes (Signed)
Subjective:    Patient ID: Eric Cooley, male    DOB: Nov 02, 1919, 78 y.o.   MRN: [id number]  HPI   Here for wellness and f/u;  Overall doing ok;  Pt denies CP, worsening SOB, DOE, wheezing, orthopnea, PND, worsening LE edema, palpitations, dizziness or syncope.  Pt denies neurological change such as new Headache, facial or extremity weakness.  Pt denies polydipsia, polyuria, or low sugar symptoms. Pt states overall good compliance with treatment and medications, good tolerability, and trying to follow lower cholesterol diet.  Pt denies worsening depressive symptoms, suicidal ideation or panic. No fever, wt loss, night sweats, loss of appetite, or other constitutional symptoms.  Pt states good ability with ADL's, low fall risk, home safety reviewed and adequate, no significant changes in hearing or vision, and occasionally active with exercise.  Pt continues to have recurring LBP without change in severity, bowel or bladder change, fever, wt loss,  worsening LE pain/numbness/weakness, gait change or falls.  Has also callous to foot and nails need trimmed Past Medical History  Diagnosis Date  . Hypertension   . MI (myocardial infarction)   . 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 CAD (coronary artery disease) 03/29/2011  . Prostate cancer 03/29/2011  . HTN (hypertension) 03/29/2011  . Hyperlipidemia 03/29/2011  . BPH (benign prostatic hypertrophy) 03/29/2011  . GERD (gastroesophageal reflux disease) 03/29/2011  . S/P cholecystectomy 03/29/2011  . 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 Thrombocytopenia 03/29/2011  . Dizziness - giddy 03/29/2011  . Allergic rhinitis, cause unspecified 03/29/2011  . S/P partial gastrectomy 03/29/2011  . Anemia 03/29/2011  . Arthritis   . Ulcer   . Urine incontinence   . Low back pain   . DJD (degenerative joint disease), lumbar 04/01/2011   Past Surgical History  Procedure Date  . Heart stent 2010  . Tonsillectomy 1951  .  Cholecystectomy 1986  . Stromal tumor     reports that he has quit smoking. He does not have any smokeless tobacco history on file. He reports that he drinks alcohol. He reports that he does not use illicit drugs. family history includes Arthritis in his father and mother and Cancer in his mother and other. No Known Allergies Current Outpatient Prescriptions on File Prior to Visit  Medication Sig Dispense Refill  . aspirin 81 MG EC tablet Take 81 mg by mouth daily.        . metoprolol tartrate (LOPRESSOR) 12.5 mg TABS Take 12.5 mg by mouth 2 (two) times daily.        . Multiple Vitamin (MULTIVITAMIN) tablet Take 1 tablet by mouth daily.        . Multiple Vitamins-Minerals (ICAPS) CAPS Take by mouth daily.        . simvastatin (ZOCOR) 10 MG tablet Take 10 mg by mouth daily.        . Tamsulosin HCl (FLOMAX) 0.4 MG CAPS Take by mouth daily.        . clopidogrel (PLAVIX) 75 MG tablet Take 75 mg by mouth daily.        . fluticasone (FLONASE) 50 MCG/ACT nasal spray Place 2 sprays into the nose daily.        . meclizine (ANTIVERT) 25 MG tablet Take 25 mg by mouth 3 (three) times daily as needed.        Marland Kitchen omeprazole (PRILOSEC) 20 MG capsule Take 20 mg by mouth 2 (two) times daily.        . predniSONE (  DELTASONE) 5 MG tablet Take 5 mg by mouth as directed.         No current facility-administered medications on file prior to visit.   Review of Systems Review of Systems  Constitutional: Negative for diaphoresis, activity change, appetite change and unexpected weight change.  HENT: Negative for hearing loss, ear pain, facial swelling, mouth sores and neck stiffness.   Eyes: Negative for pain, redness and visual disturbance.  Respiratory: Negative for shortness of breath and wheezing.   Cardiovascular: Negative for chest pain and palpitations.  Gastrointestinal: Negative for diarrhea, blood in stool, abdominal distention and rectal pain.  Genitourinary: Negative for hematuria, flank pain and  decreased urine volume.  Musculoskeletal: Negative for myalgias and joint swelling.  Skin: Negative for color change and wound.  Neurological: Negative for syncope and numbness.  Hematological: Negative for adenopathy.  Psychiatric/Behavioral: Negative for hallucinations, self-injury, decreased concentration and agitation.      Objective:   Physical Exam BP 110/62  Pulse 64  Temp(Src) 97.8 F (36.6 C) (Oral)  Ht 5\' 7"  (1.702 m)  Wt 135 lb 8 oz (61.462 kg)  BMI 21.22 kg/m2  SpO2 91% Physical Exam  VS noted Constitutional: Pt is oriented to person, place, and time. Appears well-developed and well-nourished.  HENT:  Head: Normocephalic and atraumatic.  Right Ear: External ear normal.  Left Ear: External ear normal.  Nose: Nose normal.  Mouth/Throat: Oropharynx is clear and moist.  Eyes: Conjunctivae and EOM are normal. Pupils are equal, round, and reactive to light.  Neck: Normal range of motion. Neck supple. No JVD present. No tracheal deviation present.  Cardiovascular: Normal rate, regular rhythm, normal heart sounds and intact distal pulses.   Pulmonary/Chest: Effort normal and breath sounds normal.  Abdominal: Soft. Bowel sounds are normal. There is no tenderness.  Musculoskeletal: Normal range of motion. Exhibits no edema.  Lymphadenopathy:  Has no cervical adenopathy.  Neurological: Pt is alert and oriented to person, place, and time. Pt has normal reflexes. No cranial nerve deficit. Motor/sens/dtr intact, gait intact Skin: Skin is warm and dry. No rash noted. Has left medial heel callous, nontender, Psychiatric:  Has  normal mood and affect. Behavior is normal.     Assessment & Plan:

## 2011-10-13 NOTE — Assessment & Plan Note (Signed)
Mild , for podiatry referral

## 2011-12-18 ENCOUNTER — Ambulatory Visit (INDEPENDENT_AMBULATORY_CARE_PROVIDER_SITE_OTHER): Payer: 59 | Admitting: Internal Medicine

## 2011-12-18 ENCOUNTER — Encounter: Payer: Self-pay | Admitting: Internal Medicine

## 2011-12-18 VITALS — BP 122/80 | HR 106 | Temp 97.7°F | Ht 68.0 in | Wt 124.2 lb

## 2011-12-18 DIAGNOSIS — R2689 Other abnormalities of gait and mobility: Secondary | ICD-10-CM | POA: Insufficient documentation

## 2011-12-18 DIAGNOSIS — R29818 Other symptoms and signs involving the nervous system: Secondary | ICD-10-CM

## 2011-12-18 DIAGNOSIS — R42 Dizziness and giddiness: Secondary | ICD-10-CM

## 2011-12-18 DIAGNOSIS — I10 Essential (primary) hypertension: Secondary | ICD-10-CM

## 2011-12-18 NOTE — Patient Instructions (Addendum)
You do not appear to have dehydration today to account for the dizziness Please do not drive in your condition You should use a cane to walk for now Continue all other medications as before, including the aspirin as you do Please go to LAB in the Basement for the blood and/or urine tests to be done  - Tomorrow please  Please call the phone number [phone] (the PhoneTree System) for results of testing in 2-3 days;  When calling, simply dial the number, and when prompted enter the MRN number above (the Medical Record Number) and the # key, then the message should start. If the phone number does not work, you should receive a letter in the mail in a few days, or you will be called if there is an urgent problem for which you need to be notified. You will be contacted regarding the referral for: MRI for the head, and Neurology for suspicion ? Stroke

## 2011-12-19 ENCOUNTER — Other Ambulatory Visit (INDEPENDENT_AMBULATORY_CARE_PROVIDER_SITE_OTHER): Payer: 59

## 2011-12-19 ENCOUNTER — Encounter: Payer: Self-pay | Admitting: Internal Medicine

## 2011-12-19 DIAGNOSIS — R42 Dizziness and giddiness: Secondary | ICD-10-CM

## 2011-12-19 LAB — CBC WITH DIFFERENTIAL/PLATELET
Basophils Relative: 0.5 % (ref 0.0–3.0)
Eosinophils Relative: 0.9 % (ref 0.0–5.0)
Lymphocytes Relative: 22.2 % (ref 12.0–46.0)
Monocytes Absolute: 0.4 10*3/uL (ref 0.1–1.0)
Monocytes Relative: 6.5 % (ref 3.0–12.0)
Neutrophils Relative %: 69.9 % (ref 43.0–77.0)
Platelets: 176 10*3/uL (ref 150.0–400.0)
RBC: 4.35 Mil/uL (ref 4.22–5.81)
WBC: 5.6 10*3/uL (ref 4.5–10.5)

## 2011-12-19 LAB — BASIC METABOLIC PANEL
BUN: 18 mg/dL (ref 6–23)
Chloride: 106 mEq/L (ref 96–112)
GFR: 63.55 mL/min (ref >60.00)
Potassium: 4.7 mEq/L (ref 3.5–5.1)
Sodium: 143 mEq/L (ref 135–145)

## 2011-12-19 LAB — URINALYSIS, ROUTINE W REFLEX MICROSCOPIC
Nitrite: NEGATIVE
Specific Gravity, Urine: 1.025 (ref 1.000–1.030)
Total Protein, Urine: NEGATIVE
pH: 6 (ref 5.0–8.0)

## 2011-12-19 LAB — HEPATIC FUNCTION PANEL
ALT: 26 U/L (ref 0–53)
AST: 29 U/L (ref 0–37)
Alkaline Phosphatase: 69 U/L (ref 39–117)
Bilirubin, Direct: 0.1 mg/dL (ref 0.0–0.3)
Total Bilirubin: 0.5 mg/dL (ref 0.3–1.2)

## 2011-12-19 NOTE — Assessment & Plan Note (Signed)
stable overall by hx and exam, most recent data reviewed with pt, and pt to continue medical treatment as before  BP Readings from Last 3 Encounters:  12/18/11 122/80  10/10/11 110/62  04/01/11 132/64

## 2011-12-19 NOTE — Progress Notes (Signed)
Subjective:    Patient ID: Eric Cooley, male    DOB: December 04, 1919, 78 y.o.   MRN: [id number]  HPI  Here with onset type of persistent moderate dizziness since Mon AM Mar 11, when he awoke, felt off balance, unstable to walk, so crawled to the BR.  Since then has improved somewhat but still signfiicant, tends to hold on to furniture and walls for support, and son drove him here today.  He has hx tinnitus, re-started about 4 wks ago, some improved after wax removed at urgent care, good for 1 wk, then seemed to recur again, as well as what sounds like eustachian valve type symptoms to left ear with popping, crackling without dizziness/vertigo/hearing loss until onset mar 11 as above.  Has had vertigo signficant in the past, and this is not the same, with no sense of room spinning, either postional or otherwise.  Denies HA, blurred vision, lightheaded or orthostatic type symptoms (and not orthostatic today on BP measures), ST, fever, significant sinus symptoms such as pain or congestion, cough and Pt denies chest pain, increased sob or doe, wheezing, orthopnea, PND, increased LE swelling, palpitations, or syncope.  Pt denies new neurological symptoms such as new headache, or facial or extremity weakness or numbness.   Pt denies polydipsia, polyuria.  Did have left 5th toe pain and swelling a few wks ago, none now and was wondering if might be related somehow.  Has seen cardiology, now off plavix , on ASA 81 only, prilosec changed to protonix, lisinopril 20 added, and tramadol prn but not used recently.  Has not used flonase or antivert as well Past Medical History  Diagnosis Date  . Hypertension   . MI (myocardial infarction)   . 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 CAD (coronary artery disease) 03/29/2011  . Prostate cancer 03/29/2011  . HTN (hypertension) 03/29/2011  . Hyperlipidemia 03/29/2011  . BPH (benign prostatic hypertrophy) 03/29/2011  . GERD (gastroesophageal reflux disease) 03/29/2011  .  S/P cholecystectomy 03/29/2011  . 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 Thrombocytopenia 03/29/2011  . Dizziness - giddy 03/29/2011  . Allergic rhinitis, cause unspecified 03/29/2011  . S/P partial gastrectomy 03/29/2011  . Anemia 03/29/2011  . Arthritis   . Ulcer   . Urine incontinence   . Low back pain   . DJD (degenerative joint disease), lumbar 04/01/2011   Past Surgical History  Procedure Date  . Heart stent 2010  . Tonsillectomy 1951  . Cholecystectomy 1986  . Stromal tumor     reports that he has quit smoking. He does not have any smokeless tobacco history on file. He reports that he drinks alcohol. He reports that he does not use illicit drugs. family history includes Arthritis in his father and mother and Cancer in his mother and other. No Known Allergies Current Outpatient Prescriptions on File Prior to Visit  Medication Sig Dispense Refill  . aspirin 81 MG EC tablet Take 81 mg by mouth daily.        . metoprolol tartrate (LOPRESSOR) 12.5 mg TABS Take 12.5 mg by mouth 2 (two) times daily.        . Multiple Vitamin (MULTIVITAMIN) tablet Take 1 tablet by mouth daily.        . Multiple Vitamins-Minerals (ICAPS) CAPS Take by mouth daily.        . simvastatin (ZOCOR) 10 MG tablet Take 10 mg by mouth daily.        . Tamsulosin HCl (FLOMAX)  0.4 MG CAPS Take by mouth daily.         Current Facility-Administered Medications on File Prior to Visit  Medication Dose Route Frequency Provider Last Rate Last Dose  . pneumococcal 23 valent vaccine (PNU-IMMUNE) injection 0.5 mL  0.5 mL Intramuscular Once Corwin Levins, MD       Review of Systems Review of Systems  Constitutional: Negative for diaphoresis and unexpected weight change.  HENT: Negative for drooling and tinnitus.   Eyes: Negative for photophobia and visual disturbance.  Respiratory: Negative for choking and stridor.   Gastrointestinal: Negative for vomiting and blood in stool.    Genitourinary: Negative for hematuria and decreased urine volume.  Skin: Negative for color change and wound.  Neurological: Negative for tremors and numbness.  Psychiatric/Behavioral: Negative for decreased concentration. The patient is not hyperactive.      Objective:   Physical Exam BP 122/80  Pulse 106  Temp(Src) 97.7 F (36.5 C) (Oral)  Ht 5\' 8"  (1.727 m)  Wt 124 lb 4 oz (56.359 kg)  BMI 18.89 kg/m2  SpO2 97% Physical Exam  VS noted Constitutional: Pt appears well-developed and well-nourished.  HENT: Head: Normocephalic.  Right Ear: External ear normal.  Left Ear: External ear normal.  Bilat tm's No erythema.  Sinus nontender.  Pharynx mild erythema Eyes: Conjunctivae and EOM are normal. Pupils are equal, round, and reactive to light.  Neck: Normal range of motion. Neck supple.  Cardiovascular: Normal rate and regular rhythm.   Pulmonary/Chest: Effort normal and breath sounds normal.  Abd:  Soft, NT, non-distended, + BS Neurological: Pt is alert. No cranial nerve deficit. gait unsteady, hold on to wall and sits quickly and heavily in chair getting down from exam table, motor/dtr intact Skin: Skin is warm. No erythema.  Psychiatric: Pt behavior is normal. Thought content normal.     Assessment & Plan:

## 2011-12-19 NOTE — Assessment & Plan Note (Signed)
Unclear etiology, but does not appear to orthostatic type or vertigo, hold on further meclizine, to avoid driving, for labs today to r/o metabolic

## 2011-12-19 NOTE — Assessment & Plan Note (Addendum)
Rather abrupt onset it seems by hx, after stopping plavix fairly recent;  Cont asa 81, for MRI head asap - ? Cva, refer neurology, consider cane for support and   Note: time for hx, exam, review of record with pt in room, determination of plan for further eval and tx is > 40 min

## 2011-12-20 ENCOUNTER — Encounter: Payer: Self-pay | Admitting: Internal Medicine

## 2011-12-20 ENCOUNTER — Ambulatory Visit
Admission: RE | Admit: 2011-12-20 | Discharge: 2011-12-20 | Disposition: A | Discharge status: Home / Self Care | Payer: 59 | Source: Ambulatory Visit | Attending: Internal Medicine | Admitting: Internal Medicine

## 2011-12-23 ENCOUNTER — Other Ambulatory Visit: Payer: 59

## 2012-01-01 ENCOUNTER — Encounter: Payer: Self-pay | Admitting: Neurology

## 2012-01-01 ENCOUNTER — Ambulatory Visit (INDEPENDENT_AMBULATORY_CARE_PROVIDER_SITE_OTHER): Payer: 59 | Admitting: Neurology

## 2012-01-01 VITALS — BP 128/70 | HR 112 | Ht 67.0 in | Wt 128.0 lb

## 2012-01-01 DIAGNOSIS — I639 Cerebral infarction, unspecified: Secondary | ICD-10-CM

## 2012-01-01 DIAGNOSIS — I6350 Cerebral infarction due to unspecified occlusion or stenosis of unspecified cerebral artery: Secondary | ICD-10-CM

## 2012-01-01 NOTE — Patient Instructions (Signed)
Your CTA head and neck is scheduled for Monday, March 1st at 1:00pm.  Please arrive to [facility], first floor admitting by 12:45pm.  [phone].  Do not eat or drink anything 4 hours before your test.

## 2012-01-01 NOTE — Progress Notes (Signed)
Dear Dr. Jonny Ruiz,  Thank you for having me see Eric Cooley in consultation today at [facility] for his problem with an episode o difficulty walking.  As you may recall, he is a 78 y.o. year old male with a history of hypertension, hyperlipidemia, coronary artery disease status post PCI who presents with a 2 day episode of difficulty walking. It occurred on March 11. The patient describes having difficulty when he went to get out of bed. He says he felt "weak". He was unwilling to try to stand. He stayed in bed all day. He does not endorse dizziness or vertigo. He cannot give a better description of feeling that prohibited him from standing. He does say that he had an episode of vertigo a year ago that did it in the way of his walking. He thinks he didn't try to walk this time because of a recall of a previous event.  He denies difficulty speaking swallowing or double vision with this event or the event one year ago. This event slowly got better and then the next day he was able to walk although he was unsteady. He did not fall. He did not note a tendency to walk one way or the other.  He denied problems using his arms during this recent event. You got an MRI of his brain 4 days after the event began which showed no diffusion abnormalities. He did have significant periventricular white matter disease and it was reported that he has had old cerebellar infarctions.  He did have problems with his ears feeling "clogged" about 2 months before.  He did not have any recent URTI.  He did not have vascular imaging at that time. Notably he was on aspirin 81 mg.   Past Medical History  Diagnosis Date  . Hypertension   . MI (myocardial infarction)   . 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 CAD (coronary artery disease) 03/29/2011  . Prostate cancer 03/29/2011  . HTN (hypertension) 03/29/2011  . Hyperlipidemia 03/29/2011  . BPH (benign prostatic hypertrophy) 03/29/2011  . GERD (gastroesophageal  reflux disease) 03/29/2011  . S/P cholecystectomy 03/29/2011  . 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 Thrombocytopenia 03/29/2011  . Dizziness - giddy 03/29/2011  . Allergic rhinitis, cause unspecified 03/29/2011  . S/P partial gastrectomy 03/29/2011  . Anemia 03/29/2011  . Arthritis   . Ulcer   . Urine incontinence   . Low back pain   . DJD (degenerative joint disease), lumbar 04/01/2011    Past Surgical History  Procedure Date  . Heart stent 2010  . Tonsillectomy 1951  . Cholecystectomy 1986  . Stromal tumor     History   Social History  . Marital Status: Widowed    Spouse Name: N/A    Number of Children: N/A  . Years of Education: 16   Occupational History  . retired Acupuncturist    Social History Main Topics  . Smoking status: Former Games developer  . Smokeless tobacco: Never Used   Comment: Stopped smoking in 1962  . Alcohol Use: Yes     occasional  . Drug Use: No  . Sexually Active: None   Other Topics Concern  . None   Social History Narrative  . None    Family History  Problem Relation Age of Onset  . Arthritis Mother   . Cancer Mother     ovarian cancer  . Arthritis Father   . Cancer Other  Lung cancer    Current Outpatient Prescriptions on File Prior to Visit  Medication Sig Dispense Refill  . aspirin 81 MG EC tablet Take 81 mg by mouth daily.        Marland Kitchen lisinopril (PRINIVIL,ZESTRIL) 20 MG tablet Take 20 mg by mouth daily.      . metoprolol tartrate (LOPRESSOR) 12.5 mg TABS Take 12.5 mg by mouth 2 (two) times daily.        . Multiple Vitamins-Minerals (ICAPS) CAPS Take by mouth daily.        . nitroGLYCERIN (NITROSTAT) 0.3 MG SL tablet Place 0.3 mg under the tongue every 5 (five) minutes as needed.      . pantoprazole (PROTONIX) 20 MG tablet Take 20 mg by mouth daily.      . simvastatin (ZOCOR) 10 MG tablet Take 10 mg by mouth daily.        . Tamsulosin HCl (FLOMAX) 0.4 MG CAPS Take by mouth daily.        .  traMADol (ULTRAM) 50 MG tablet Take 50 mg by mouth every 6 (six) hours as needed.       Current Facility-Administered Medications on File Prior to Visit  Medication Dose Route Frequency Provider Last Rate Last Dose  . pneumococcal 23 valent vaccine (PNU-IMMUNE) injection 0.5 mL  0.5 mL Intramuscular Once Corwin Levins, MD        No Known Allergies    ROS:  13 systems were reviewed and are notable for the fact that he had pain in his little toe several days before the recent episode that he though was gout.  He did not take any medication for it..  All other review of systems are unremarkable.   Examination:  Filed Vitals:   01/01/12 0829 01/01/12 0839 01/01/12 0840  BP: 128/78 142/62 128/70  Pulse: 92 88 112  Height: 5\' 7"  (1.702 m)    Weight: 128 lb (58.06 kg)       In general, well appearing older man.  Cardiovascular: The patient has a regular rate and rhythm and no carotid bruits.  Fundoscopy:  Limited by pupillary constriction.  Mental status:   The patient is oriented to person, place and time. Recent and remote memory are intact. Attention span and concentration are normal. Language including repetition, naming, following commands are intact. Fund of knowledge of current and historical events, as well as vocabulary are normal.  Cranial Nerves: Pupils are constricted, unreactive. Visual fields full to confrontation in left eye, peripheral vision impaired in right eye. Extraocular movements reveal full version, mild end gaze nystagmus, hypometric saccades. Facial sensation and muscles of mastication are intact. Muscles of facial expression are symmetric. Hearing decreased bilaterally to finger rub. Tongue protrusion, uvula, palate midline.  Shoulder shrug reveals slight delay on left.  Motor:  The patient has normal bulk and tone, no pronator drift.  There is a mild postural tremor.  5/5 muscle strength bilaterally.  Reflexes:    Biceps  Triceps Brachioradialis Knee Ankle  Right 0  0  0   2+ 1+  Left  0  0  0   2+ 1+  Toes down  Coordination:  Normal finger to nose.  H2S may be slightly abnormal with right foot.  No rebound.  No dysdiado. Sensation is intact to temperature but decreased to vibration and position.  Gait and Station are narrow based.   Romberg is negative  MRI brain was reviewed.  I was not impressed by previous cerebellar infarctions.  No acute infarctions.  Moderate to severe WMD.  Impression:  1.  Episode of difficulty walking - Unlikely to be due to ischemic stroke due to -ve DWI imaging just days after the event. Given how non-specific his complaints were I wonder if he had a systemic illness that may have caused his difficulty walking. However, it does make sense to increase his aspirin to 325 mg for secondary stroke prevention(although I am unsure this is any better than 81mg ) as well as look at his intracranial and extracranial vasculature with a CTA.  If he has another event he has been instructed to call 911 and not wait.    If his vasculature is normal, then we don't need to see him back unless he has another spell.  Thank you for having [place] see Eric Cooley in consultation.  Feel free to contact me with any questions.  Lupita Raider Modesto Charon, MD [facility], [place] [address] [place], [place] [address] Phone: [phone] Fax: [phone].

## 2012-01-06 ENCOUNTER — Other Ambulatory Visit (HOSPITAL_COMMUNITY): Payer: 59

## 2012-01-06 ENCOUNTER — Ambulatory Visit (HOSPITAL_COMMUNITY)
Admission: RE | Admit: 2012-01-06 | Discharge: 2012-01-06 | Disposition: A | Discharge status: Home / Self Care | Payer: Medicare Other | Source: Ambulatory Visit | Attending: Neurology | Admitting: Neurology

## 2012-01-06 ENCOUNTER — Encounter (HOSPITAL_COMMUNITY): Payer: Self-pay

## 2012-01-06 ENCOUNTER — Inpatient Hospital Stay (HOSPITAL_COMMUNITY): Admission: RE | Admit: 2012-01-06 | Payer: 59 | Source: Ambulatory Visit

## 2012-01-06 DIAGNOSIS — I639 Cerebral infarction, unspecified: Secondary | ICD-10-CM

## 2012-01-06 DIAGNOSIS — R42 Dizziness and giddiness: Secondary | ICD-10-CM | POA: Insufficient documentation

## 2012-01-06 DIAGNOSIS — M47812 Spondylosis without myelopathy or radiculopathy, cervical region: Secondary | ICD-10-CM | POA: Insufficient documentation

## 2012-01-06 DIAGNOSIS — R51 Headache: Secondary | ICD-10-CM | POA: Insufficient documentation

## 2012-01-06 MED ORDER — IOHEXOL 350 MG/ML SOLN
50.0000 mL | Freq: Once | INTRAVENOUS | Status: AC | PRN
  Administered 2012-01-06: 50 mL via INTRAVENOUS

## 2012-01-14 ENCOUNTER — Other Ambulatory Visit: Payer: Self-pay | Admitting: Internal Medicine

## 2012-01-15 NOTE — Telephone Encounter (Signed)
Done per emr 

## 2012-01-16 ENCOUNTER — Telehealth: Payer: Self-pay | Admitting: Neurology

## 2012-01-16 NOTE — Telephone Encounter (Signed)
Message copied by Benay Spice on Thu Jan 16, 2012  8:36 AM ------      Message from: Milas Gain      Created: Wed Jan 15, 2012  6:15 PM       Let Mr. Arcidiacono know his blood vessels in his neck and head look good.

## 2012-01-16 NOTE — Telephone Encounter (Signed)
Called and spoke with the patient. Information given as directed by Dr. Modesto Charon re: normal CTA-head and neck. No other issues voiced at this time.

## 2012-01-28 ENCOUNTER — Ambulatory Visit: Payer: 59 | Admitting: Internal Medicine

## 2012-01-29 ENCOUNTER — Encounter: Payer: Self-pay | Admitting: Internal Medicine

## 2012-01-29 ENCOUNTER — Ambulatory Visit (INDEPENDENT_AMBULATORY_CARE_PROVIDER_SITE_OTHER)
Admission: RE | Admit: 2012-01-29 | Discharge: 2012-01-29 | Disposition: A | Discharge status: Home / Self Care | Payer: 59 | Source: Ambulatory Visit | Attending: Internal Medicine | Admitting: Internal Medicine

## 2012-01-29 ENCOUNTER — Ambulatory Visit (INDEPENDENT_AMBULATORY_CARE_PROVIDER_SITE_OTHER): Payer: 59 | Admitting: Internal Medicine

## 2012-01-29 VITALS — BP 120/70 | HR 91 | Temp 97.4°F | Ht 68.0 in | Wt 117.5 lb

## 2012-01-29 DIAGNOSIS — I10 Essential (primary) hypertension: Secondary | ICD-10-CM

## 2012-01-29 DIAGNOSIS — J189 Pneumonia, unspecified organism: Secondary | ICD-10-CM

## 2012-01-29 DIAGNOSIS — E785 Hyperlipidemia, unspecified: Secondary | ICD-10-CM

## 2012-01-29 MED ORDER — LISINOPRIL 20 MG PO TABS: 10.0000 mg | ORAL_TABLET | Freq: Every day | ORAL | Status: DC

## 2012-01-29 NOTE — Patient Instructions (Signed)
Your pneumonia seems improved today Please continue all the treatment that you were asked to take at the Urgent Care Please go to XRAY in the Basement for the x-ray test You will be contacted by phone if any changes need to be made immediately.  Otherwise, you will receive a letter about your results with an explanation. Please also take only HALF of the Lisinopril 20 mg (for total of 10 mg) for now OK to HOLD (do not take) the metoprolol 12.5 mg twice per day for now OK to continue your other usual medications Please return in 1 month, or sooner if needed

## 2012-01-29 NOTE — Assessment & Plan Note (Addendum)
Ok to re-start lisinopril at 10 mg (1/2 of the 20) for the time being, hold the metoprolol,  to f/u any worsening symptoms or concerns or f/u 4 wks

## 2012-01-29 NOTE — Assessment & Plan Note (Signed)
With hx of CAD, goal ldl < 70, last ldl jan 2013 57 - to cont med as is - in fact to re-start

## 2012-01-29 NOTE — Progress Notes (Signed)
Subjective:    Patient ID: Eric Cooley, male    DOB: Mar 05, 1920, 78 y.o.   MRN: [id number]  HPI  78 yo M here for f/u PNA as diagnosed per UC 6 days ago per pt (no details on chart); pt unclear about details but was tx with levaquin 500 x 10 days, mucinex and albuterol MDI;  On his own since taking new meds he stopped all of his other meds to make sure no conflicts;  Overall feels improved especially in the past 2-3 days with no fever, ST, HA, cough, CP, wheezing, sob/doe, confusion, falls, and in fact stamina and appetitie improved as well.  Pt also denies  orthopnea, PND, increased LE swelling, palpitations, dizziness or syncope.  No further spells as per last visit, saw Dr Catalina Lunger, CT angio neck for vascular disease.  Most recent LDL 57 jan 2013.  Pt denies new neurological symptoms such as new headache, or facial or extremity weakness or numbness   Pt denies polydipsia, polyuria Past Medical History  Diagnosis Date  . Hypertension   . MI (myocardial infarction)   . 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 CAD (coronary artery disease) 03/29/2011  . Prostate cancer 03/29/2011  . HTN (hypertension) 03/29/2011  . Hyperlipidemia 03/29/2011  . BPH (benign prostatic hypertrophy) 03/29/2011  . GERD (gastroesophageal reflux disease) 03/29/2011  . S/P cholecystectomy 03/29/2011  . 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 Thrombocytopenia 03/29/2011  . Dizziness - giddy 03/29/2011  . Allergic rhinitis, cause unspecified 03/29/2011  . S/P partial gastrectomy 03/29/2011  . Anemia 03/29/2011  . Arthritis   . Ulcer   . Urine incontinence   . Low back pain   . DJD (degenerative joint disease), lumbar 04/01/2011   Past Surgical History  Procedure Date  . Heart stent 2010  . Tonsillectomy 1951  . Cholecystectomy 1986  . Stromal tumor     reports that he has quit smoking. He has never used smokeless tobacco. He reports that he drinks alcohol. He reports that he  does not use illicit drugs. family history includes Arthritis in his father and mother and Cancer in his mother and other. No Known Allergies Current Outpatient Prescriptions on File Prior to Visit  Medication Sig Dispense Refill  . albuterol (VOSPIRE ER) 4 MG 12 hr tablet Take 1 to 2 by mouth two times daily for 10 days.      Marland Kitchen aspirin 81 MG EC tablet Take 81 mg by mouth daily.        . Multiple Vitamins-Minerals (ICAPS) CAPS Take by mouth daily.        . nitroGLYCERIN (NITROSTAT) 0.3 MG SL tablet Place 0.3 mg under the tongue every 5 (five) minutes as needed.      . pantoprazole (PROTONIX) 20 MG tablet Take 20 mg by mouth daily.      . simvastatin (ZOCOR) 10 MG tablet Take 10 mg by mouth daily.        . Tamsulosin HCl (FLOMAX) 0.4 MG CAPS Take by mouth daily.        . traMADol (ULTRAM) 50 MG tablet Take 50 mg by mouth every 6 (six) hours as needed.      Marland Kitchen DISCONTD: fluticasone (FLONASE) 50 MCG/ACT nasal spray Place 2 sprays into the nose daily.        Marland Kitchen DISCONTD: lisinopril (PRINIVIL,ZESTRIL) 20 MG tablet Take 20 mg by mouth daily.      Marland Kitchen DISCONTD: omeprazole (PRILOSEC) 20 MG capsule Take 20  mg by mouth 2 (two) times daily.         Current Facility-Administered Medications on File Prior to Visit  Medication Dose Route Frequency Provider Last Rate Last Dose  . pneumococcal 23 valent vaccine (PNU-IMMUNE) injection 0.5 mL  0.5 mL Intramuscular Once Corwin Levins, MD       Review of Systems Review of Systems  Constitutional: Negative for diaphoresis and unexpected weight change except for several lbs wt loss in the past wk with pna illness  Eyes: Negative for photophobia and visual disturbance.  Respiratory: Negative for choking and stridor.   Gastrointestinal: Negative for vomiting and blood in stool.  Genitourinary: Negative for hematuria and decreased urine volume.  Musculoskeletal: Negative for gait problem.  Skin: Negative for color change and wound.  Neurological: Negative for  tremors and numbness.     Objective:   Physical Exam BP 120/70  Pulse 91  Temp(Src) 97.4 F (36.3 C) (Oral)  Ht 5\' 8"  (1.727 m)  Wt 117 lb 8 oz (53.298 kg)  BMI 17.87 kg/m2  SpO2 97% Physical Exam  VS noted Constitutional: Pt appears well-developed and well-nourished.  HENT: Head: Normocephalic.  Right Ear: External ear normal.  Left Ear: External ear normal.  Eyes: Conjunctivae and EOM are normal. Pupils are equal, round, and reactive to light.  Neck: Normal range of motion. Neck supple.  Cardiovascular: Normal rate and regular rhythm.   Pulmonary/Chest: Effort normal and breath sounds normal. no rales or wheezing Abd:  Soft, NT, non-distended, + BS Neurological: Pt is alert. Motor/gait intact Skin: Skin is warm. No erythema. No rash. No LE edema Psychiatric: Pt behavior is normal. Thought content normal. Not confused     Assessment & Plan:

## 2012-01-29 NOTE — Assessment & Plan Note (Addendum)
Per pt hx after cxr at UC 6 days ago, Details unclear, symptomatically/clinically improved, to finish the levaquin asd, for f/u cxr today,  to f/u any worsening symptoms or concerns

## 2012-03-04 ENCOUNTER — Encounter: Payer: Self-pay | Admitting: Internal Medicine

## 2012-03-04 ENCOUNTER — Ambulatory Visit (INDEPENDENT_AMBULATORY_CARE_PROVIDER_SITE_OTHER): Payer: 59 | Admitting: Internal Medicine

## 2012-03-04 DIAGNOSIS — R300 Dysuria: Secondary | ICD-10-CM

## 2012-03-04 DIAGNOSIS — I10 Essential (primary) hypertension: Secondary | ICD-10-CM

## 2012-03-04 DIAGNOSIS — K5649 Other impaction of intestine: Secondary | ICD-10-CM

## 2012-03-04 DIAGNOSIS — K5900 Constipation, unspecified: Secondary | ICD-10-CM | POA: Insufficient documentation

## 2012-03-04 MED ORDER — CEPHALEXIN 500 MG PO CAPS: 500.0000 mg | ORAL_CAPSULE | Freq: Four times a day (QID) | ORAL | Status: AC

## 2012-03-04 MED ORDER — TRAMADOL HCL 50 MG PO TABS: 50.0000 mg | ORAL_TABLET | Freq: Four times a day (QID) | ORAL | Status: DC | PRN

## 2012-03-04 NOTE — Progress Notes (Signed)
Subjective:    Patient ID: Eric Cooley, male    DOB: May 23, 1920, 78 y.o.   MRN: [id number]  HPI  Here with c/o about 7 days lack of BM, with more symptoms in the past 3-4 days with abd discomfort, crampy without n/v, abd distension, but more rectal discomfort than anything else;  No blood, fever, radiation or pain, no better after home fleets and 2 other otc oral laxative, and  Denies urinary symptoms such as dysuria, frequency, urgency,or hematuria, except may be less volume urine in the past day, and ? Some slight dysuria.   Does have several wks ongoing nasal allergy symptoms with clear congestion, itch and sneeze, without fever, pain, ST, cough or wheezing.  Currently may Not be taking the lisinopril, though is taking the zocor, some confusion about meds.  Pt denies chest pain, increased sob or doe, wheezing, orthopnea, PND, increased LE swelling, palpitations, dizziness or syncope.   Pt denies fever, wt loss, night sweats, loss of appetite, or other constitutional symptoms    Past Medical History  Diagnosis Date  . Hypertension   . MI (myocardial infarction)   . 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 CAD (coronary artery disease) 03/29/2011  . Prostate cancer 03/29/2011  . HTN (hypertension) 03/29/2011  . Hyperlipidemia 03/29/2011  . BPH (benign prostatic hypertrophy) 03/29/2011  . GERD (gastroesophageal reflux disease) 03/29/2011  . S/P cholecystectomy 03/29/2011  . 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 Thrombocytopenia 03/29/2011  . Dizziness - giddy 03/29/2011  . Allergic rhinitis, cause unspecified 03/29/2011  . S/P partial gastrectomy 03/29/2011  . Anemia 03/29/2011  . Arthritis   . Ulcer   . Urine incontinence   . Low back pain   . DJD (degenerative joint disease), lumbar 04/01/2011   Past Surgical History  Procedure Date  . Heart stent 2010  . Tonsillectomy 1951  . Cholecystectomy 1986  . Stromal tumor     reports that he has quit  smoking. He has never used smokeless tobacco. He reports that he drinks alcohol. He reports that he does not use illicit drugs. family history includes Arthritis in his father and mother and Cancer in his mother and other. No Known Allergies Current Outpatient Prescriptions on File Prior to Visit  Medication Sig Dispense Refill  . albuterol (VOSPIRE ER) 4 MG 12 hr tablet Take 1 to 2 by mouth two times daily for 10 days.      Marland Kitchen aspirin 81 MG EC tablet Take 81 mg by mouth daily.        . Multiple Vitamins-Minerals (ICAPS) CAPS Take by mouth daily.        . nitroGLYCERIN (NITROSTAT) 0.3 MG SL tablet Place 0.3 mg under the tongue every 5 (five) minutes as needed.      . pantoprazole (PROTONIX) 20 MG tablet Take 20 mg by mouth daily.      . simvastatin (ZOCOR) 10 MG tablet Take 10 mg by mouth daily.        . Tamsulosin HCl (FLOMAX) 0.4 MG CAPS Take by mouth daily.        Marland Kitchen dextromethorphan-guaiFENesin (MUCINEX DM) 30-600 MG per 12 hr tablet Take 1 to 2 by mouth two times daily for 10 days      . levofloxacin (LEVAQUIN) 500 MG tablet Take 500 mg by mouth daily.      Marland Kitchen lisinopril (PRINIVIL,ZESTRIL) 20 MG tablet Take 0.5 tablets (10 mg total) by mouth daily.      Marland Kitchen  traMADol (ULTRAM) 50 MG tablet Take 50 mg by mouth every 6 (six) hours as needed.      Marland Kitchen DISCONTD: fluticasone (FLONASE) 50 MCG/ACT nasal spray Place 2 sprays into the nose daily.        Marland Kitchen DISCONTD: omeprazole (PRILOSEC) 20 MG capsule Take 20 mg by mouth 2 (two) times daily.         Current Facility-Administered Medications on File Prior to Visit  Medication Dose Route Frequency Provider Last Rate Last Dose  . pneumococcal 23 valent vaccine (PNU-IMMUNE) injection 0.5 mL  0.5 mL Intramuscular Once Corwin Levins, MD       Current Outpatient Prescriptions on File Prior to Visit  Medication Sig Dispense Refill  . aspirin 81 MG EC tablet Take 81 mg by mouth daily.        . Multiple Vitamins-Minerals (ICAPS) CAPS Take by mouth daily.        .  nitroGLYCERIN (NITROSTAT) 0.3 MG SL tablet Place 0.3 mg under the tongue every 5 (five) minutes as needed.      . pantoprazole (PROTONIX) 20 MG tablet Take 20 mg by mouth daily.      . simvastatin (ZOCOR) 10 MG tablet Take 10 mg by mouth daily.        . Tamsulosin HCl (FLOMAX) 0.4 MG CAPS Take by mouth daily.        Marland Kitchen DISCONTD: fluticasone (FLONASE) 50 MCG/ACT nasal spray Place 2 sprays into the nose daily.        Marland Kitchen DISCONTD: omeprazole (PRILOSEC) 20 MG capsule Take 20 mg by mouth 2 (two) times daily.         Review of Systems Constitutional: Negative for diaphoresis and unexpected weight change.  HENT: Negative for drooling and tinnitus.   Eyes: Negative for photophobia and visual disturbance.  Respiratory: Negative for choking and stridor.   Gastrointestinal: Negative for vomiting and blood in stool.  Genitourinary: Negative for hematuria and decreased urine volume.  Skin: Negative for color change and wound.  Neurological: Negative for tremors and numbness.     Objective:   Physical Exam BP 120/78  Pulse 87  Temp(Src) 97.5 F (36.4 C) (Oral)  Ht 5\' 8"  (1.727 m)  Wt 119 lb (53.978 kg)  BMI 18.09 kg/m2  SpO2 97% Physical Exam  VS noted, mild uncomfortable appaering, nontoxic Constitutional: Pt appears well-developed and well-nourished.  HENT: Head: Normocephalic.  Right Ear: External ear normal.  Left Ear: External ear normal.  Eyes: Conjunctivae and EOM are normal. Pupils are equal, round, and reactive to light.  Neck: Normal range of motion. Neck supple.  Cardiovascular: Normal rate and regular rhythm.   Pulmonary/Chest: Effort normal and breath sounds normal.  Abd:  Soft, NT, non-distended, + BS - benign Neurological: Pt is alert. Not confused Skin: Skin is warm. No erythema.  Psychiatric: Pt behavior is normal. Thought content normal.     Assessment & Plan:

## 2012-03-04 NOTE — Patient Instructions (Addendum)
Take all new medications as prescribed - the antibiotic Please go to LAB in the Basement for the urine tests to be done today You will be contacted regarding the referral for: soap suds enema per home health Continue all other medications as before (see the list below) Let us know if you are taking the lisinopril as you said you are not taking it today Please have the pharmacy call with any refills you may need.

## 2012-03-05 ENCOUNTER — Telehealth: Payer: Self-pay

## 2012-03-05 NOTE — Telephone Encounter (Signed)
Would be good to the UA if he could; order is still in the computer

## 2012-03-05 NOTE — Telephone Encounter (Signed)
The patient called to inform he does not need the nurse to come out as he is no longer constipated.  He has had several BM's today.  He did not get his urine test yesterday as could not go.  He stated he would come  Back once able to if need to do.  Please advise.

## 2012-03-06 ENCOUNTER — Other Ambulatory Visit (INDEPENDENT_AMBULATORY_CARE_PROVIDER_SITE_OTHER): Payer: 59

## 2012-03-06 ENCOUNTER — Telehealth: Payer: Self-pay

## 2012-03-06 ENCOUNTER — Encounter: Payer: Self-pay | Admitting: Internal Medicine

## 2012-03-06 DIAGNOSIS — Z0000 Encounter for general adult medical examination without abnormal findings: Secondary | ICD-10-CM

## 2012-03-06 DIAGNOSIS — R300 Dysuria: Secondary | ICD-10-CM

## 2012-03-06 LAB — URINALYSIS, ROUTINE W REFLEX MICROSCOPIC
Bilirubin Urine: NEGATIVE
Nitrite: NEGATIVE
Total Protein, Urine: NEGATIVE
Urine Glucose: NEGATIVE
Urobilinogen, UA: 0.2 (ref 0.0–1.0)

## 2012-03-06 MED ORDER — LISINOPRIL 10 MG PO TABS: 10.0000 mg | ORAL_TABLET | Freq: Every day | ORAL | Status: DC

## 2012-03-06 NOTE — Telephone Encounter (Signed)
Put urine culture in for future

## 2012-03-06 NOTE — Telephone Encounter (Signed)
Patient would like a refill on lisinopril that previously he had taken and could not find bottle at home. [facility] [address]

## 2012-03-06 NOTE — Telephone Encounter (Signed)
Called the patient and he had already planned to return to the lab today to have urine checked.  He could not find his lisinopril and would like new prescription sent to [facility] on [address]

## 2012-03-06 NOTE — Telephone Encounter (Signed)
Patient informed. 

## 2012-03-06 NOTE — Telephone Encounter (Signed)
A user error has taken place: encounter opened in error, closed for administrative reasons.

## 2012-03-06 NOTE — Telephone Encounter (Signed)
Lisinopril 10 mg done erx

## 2012-03-07 LAB — URINE CULTURE
Colony Count: NO GROWTH
Organism ID, Bacteria: NO GROWTH

## 2012-03-08 ENCOUNTER — Encounter: Payer: Self-pay | Admitting: Internal Medicine

## 2012-03-08 NOTE — Assessment & Plan Note (Addendum)
stable overall by hx and exam, most recent data reviewed with pt, and pt to continue medical treatment as before - not clear if he is currently on the ACE, pt to make sure BP Readings from Last 3 Encounters:  03/04/12 120/78  01/29/12 120/70  01/01/12 128/70

## 2012-03-08 NOTE — Assessment & Plan Note (Signed)
Ok for home health for 2L soap suds enema,  to f/u any worsening symptoms or concerns

## 2012-03-08 NOTE — Assessment & Plan Note (Signed)
For urine studies, ? UTI  - seems likely - for cephalexin course,  to f/u any worsening symptoms or concerns

## 2012-03-12 ENCOUNTER — Telehealth: Payer: Self-pay

## 2012-03-12 DIAGNOSIS — R2689 Other abnormalities of gait and mobility: Secondary | ICD-10-CM

## 2012-03-12 DIAGNOSIS — I2510 Atherosclerotic heart disease of native coronary artery without angina pectoris: Secondary | ICD-10-CM

## 2012-03-12 DIAGNOSIS — K5900 Constipation, unspecified: Secondary | ICD-10-CM

## 2012-03-12 NOTE — Telephone Encounter (Signed)
The patient has not had a BM in 6 days.  He would like an order put in for home health to come out to do soap suds enema as previously ordered at last OV, but the patient canceled.  Please advise

## 2012-03-12 NOTE — Telephone Encounter (Signed)
Done per emr 

## 2012-03-13 ENCOUNTER — Telehealth: Payer: Self-pay

## 2012-03-13 NOTE — Telephone Encounter (Signed)
[facility] would like a verbal ok to give the patient a fleet enema  In the home today.  The patient states he has had 2 BM since yesterday.  Please advise

## 2012-03-13 NOTE — Telephone Encounter (Signed)
Ok for verbal 

## 2012-03-13 NOTE — Telephone Encounter (Signed)
[facility] informed ok

## 2012-03-17 ENCOUNTER — Telehealth: Payer: Self-pay

## 2012-03-17 NOTE — Telephone Encounter (Signed)
Ok for [employer]  - ok for verbal

## 2012-03-17 NOTE — Telephone Encounter (Signed)
Called Almira Coaster [facility] left message to call back

## 2012-03-17 NOTE — Telephone Encounter (Signed)
Called [facility] Gina informed of MD's verbal ok.

## 2012-03-17 NOTE — Telephone Encounter (Signed)
HHRN called stating that she received order for fleet enema for pt but pt reports that he has already tried x 2 with no BM. Pt also says that he is only able to introduce a 1/3 of enema before he "hits a block". HHRN believed pt to be impacted and is requesting verbal orders to disimpact at today's visit - 2pm. Please advise.

## 2012-03-18 ENCOUNTER — Emergency Department (HOSPITAL_COMMUNITY)
Admission: EM | Admit: 2012-03-18 | Discharge: 2012-03-18 | Disposition: A | Discharge status: Home / Self Care | Payer: Medicare Other | Attending: Emergency Medicine | Admitting: Emergency Medicine

## 2012-03-18 ENCOUNTER — Emergency Department (HOSPITAL_COMMUNITY): Payer: Medicare Other

## 2012-03-18 ENCOUNTER — Telehealth: Payer: Self-pay | Admitting: Internal Medicine

## 2012-03-18 ENCOUNTER — Encounter (HOSPITAL_COMMUNITY): Payer: Self-pay | Admitting: Emergency Medicine

## 2012-03-18 DIAGNOSIS — I2510 Atherosclerotic heart disease of native coronary artery without angina pectoris: Secondary | ICD-10-CM | POA: Insufficient documentation

## 2012-03-18 DIAGNOSIS — Z7982 Long term (current) use of aspirin: Secondary | ICD-10-CM | POA: Insufficient documentation

## 2012-03-18 DIAGNOSIS — E785 Hyperlipidemia, unspecified: Secondary | ICD-10-CM | POA: Insufficient documentation

## 2012-03-18 DIAGNOSIS — I252 Old myocardial infarction: Secondary | ICD-10-CM | POA: Insufficient documentation

## 2012-03-18 DIAGNOSIS — N400 Enlarged prostate without lower urinary tract symptoms: Secondary | ICD-10-CM | POA: Insufficient documentation

## 2012-03-18 DIAGNOSIS — Z87891 Personal history of nicotine dependence: Secondary | ICD-10-CM | POA: Insufficient documentation

## 2012-03-18 DIAGNOSIS — K219 Gastro-esophageal reflux disease without esophagitis: Secondary | ICD-10-CM | POA: Insufficient documentation

## 2012-03-18 DIAGNOSIS — K5900 Constipation, unspecified: Secondary | ICD-10-CM | POA: Insufficient documentation

## 2012-03-18 DIAGNOSIS — Z8546 Personal history of malignant neoplasm of prostate: Secondary | ICD-10-CM | POA: Insufficient documentation

## 2012-03-18 DIAGNOSIS — D649 Anemia, unspecified: Secondary | ICD-10-CM | POA: Insufficient documentation

## 2012-03-18 DIAGNOSIS — I10 Essential (primary) hypertension: Secondary | ICD-10-CM | POA: Insufficient documentation

## 2012-03-18 NOTE — Telephone Encounter (Signed)
Almira Coaster with [facility] informed of MD' s instructions.

## 2012-03-18 NOTE — ED Provider Notes (Signed)
History     CSN: [id number]  Arrival date & time 03/18/12  1443   First MD Initiated Contact with Patient 03/18/12 2040      Chief Complaint  Patient presents with  . Constipation    (Consider location/radiation/quality/duration/timing/severity/associated sxs/prior treatment) HPI  Patient is an 78 year old gentleman with a 18 day history of constipation. She had had 2 small bowel movements approximately 12 days ago. No improvement since then. Has seen a practitioner twice for this condition was put on various laxatives none with any meaningful success.  patient has tried mag citrate Fleet enema,  enemas times for MiraLax the last 7 days he denies any discomfort. He denies any nausea or vomiting. He  endorses that yesterday a nurse tried digital disimpaction which was unsuccessful.  Comfortable with normal vital signs.  He does endorse decreased intake. Nothing makes the symptoms better or worse. Past Medical History  Diagnosis Date  . Hypertension   . MI (myocardial infarction)   . 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 CAD (coronary artery disease) 03/29/2011  . HTN (hypertension) 03/29/2011  . Hyperlipidemia 03/29/2011  . BPH (benign prostatic hypertrophy) 03/29/2011  . GERD (gastroesophageal reflux disease) 03/29/2011  . S/P cholecystectomy 03/29/2011  . 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 Thrombocytopenia 03/29/2011  . Dizziness - giddy 03/29/2011  . Allergic rhinitis, cause unspecified 03/29/2011  . S/P partial gastrectomy 03/29/2011  . Anemia 03/29/2011  . Arthritis   . Ulcer   . Urine incontinence   . Low back pain   . DJD (degenerative joint disease), lumbar 04/01/2011  . Prostate cancer 03/29/2011    Past Surgical History  Procedure Date  . Heart stent 2010  . Tonsillectomy 1951  . Cholecystectomy 1986  . Stromal tumor     Family History  Problem Relation Age of Onset  . Arthritis Mother   . Cancer Mother     ovarian cancer   . Arthritis Father   . Cancer Other     Lung cancer    History  Substance Use Topics  . Smoking status: Former Games developer  . Smokeless tobacco: Never Used   Comment: Stopped smoking in 1962  . Alcohol Use: Yes     occasional      Review of Systems Constitutional: Negative for fever and chills.  HENT: Negative for ear pain, sore throat and trouble swallowing.   Eyes: Negative for pain and visual disturbance.  Respiratory: Negative for cough and shortness of breath.   Cardiovascular: Negative for chest pain and leg swelling.  Gastrointestinal: Negative for nausea, vomiting, abdominal pain and diarrhea.  Genitourinary: Negative for dysuria, urgency and frequency.  Musculoskeletal: Negative for back pain and joint swelling.  Skin: Negative for rash and wound.  Neurological: Negative for dizziness, syncope, speech difficulty, weakness and numbness.    Allergies  Review of patient's allergies indicates no known allergies.  Home Medications   Current Outpatient Rx  Name Route Sig Dispense Refill  . ASPIRIN 81 MG PO TBEC Oral Take 81 mg by mouth daily.      Marland Kitchen LISINOPRIL 10 MG PO TABS Oral Take 1 tablet (10 mg total) by mouth daily. 90 tablet 3  . ICAPS PO CAPS Oral Take 1 capsule by mouth daily.     Marland Kitchen NITROGLYCERIN 0.3 MG SL SUBL Sublingual Place 0.3 mg under the tongue every 5 (five) minutes as needed. For chest pain    . PANTOPRAZOLE SODIUM 20 MG PO TBEC Oral Take 20  mg by mouth daily.    Marland Kitchen SIMVASTATIN 10 MG PO TABS Oral Take 10 mg by mouth daily.      Marland Kitchen TAMSULOSIN HCL 0.4 MG PO CAPS Oral Take by mouth daily.      . TRAMADOL HCL 50 MG PO TABS Oral Take 50 mg by mouth every 6 (six) hours as needed. For pain      BP 132/64  Pulse 74  Temp 97.2 F (36.2 C) (Oral)  Resp 16  SpO2 100%  Physical Exam Consitutional: Pt in no acute distress.   Head: Normocephalic and atraumatic.  Eyes: Extraocular motion intact, no scleral icterus Neck: Supple without meningismus, mass, or  overt JVD Respiratory: Effort normal and breath sounds normal. No respiratory distress. CV: Heart regular rate and rhythm, no obvious murmurs.  Pulses +2 and symmetric Abdomen: Soft, non-tender, non-distended MSK: Extremities are atraumatic without deformity, ROM intact Skin: Warm, dry, intact Neuro: Alert and oriented, no motor deficit noted.  CNs intact.  PERRL.  Reflexes normal BLE, no clonus.  Hips, knee and ankle, arm and wrist strength maintained.  FTN and RAM normal.  CNs 2-12 normal.  Psychiatric: Mood and affect are normal    ED Course  Procedures (including critical care time)  Labs Reviewed - No data to display Dg Abd 2 Views  03/18/2012  *RADIOLOGY REPORT*  Clinical Data: Constipation  ABDOMEN - 2 VIEW  Comparison: On 01/24/2010 CT  Findings: Moderate stool burden.  Nonobstructive bowel gas pattern. Surgical sutures project over the right upper quadrant and upper mid abdomen.  Tortuous lower thoracic aorta.  Lung bases are clear. No acute osseous finding.  IMPRESSION: Nonobstructive bowel gas pattern.  Moderate stool burden.  Original Report Authenticated By: Waneta Martins, M.D.     1. Constipation       MDM  Constipation versus partial obstruction. Patient looks very well on exam with normal vital signs. Nontender abdomen nondistended abdomen. Attempted digital disimpaction, there was no fecal matter noted. Plain films of the abdomen.  Otherwise patient is essentially asymptomatic.  Imaging per above.  Patient will need to increase the MiraLax dose, take off fluids. Outpatient referral to gastrointestinal doctor term precautions given and discussed with the patient.  PT DC home stable.  Discussed with pt the clinical impression, treatment in the ED, and follow up plan.  We alslo discussed the indications for returning to the ED, which include shortness or breath, confusion, fever, new weakness or numbness, chest pain, or any other concerning symptom.  The pt understood  the treatment and plan, is stable, and is able to leave the ED.              Larrie Kass, MD 03/19/12 Aretha Parrot

## 2012-03-18 NOTE — ED Notes (Signed)
Patient transported to X-ray 

## 2012-03-18 NOTE — ED Notes (Signed)
Updated pt on delay.

## 2012-03-18 NOTE — Telephone Encounter (Signed)
I would not be able to treat in the office  Needs to go to ER to r/o obstruction now

## 2012-03-18 NOTE — Telephone Encounter (Signed)
Eric Cooley also called Eric Cooley.  She tried to reach the impaction, and could not reach it.  She thinks he should be seen here. He has not had a bowl movement since May 31.  The fleet enemas have not worked.  Should we work him in today.

## 2012-03-18 NOTE — Telephone Encounter (Signed)
Eric Cooley from [facility] told him he may need to go to the hospital for the impaction

## 2012-03-18 NOTE — Discharge Instructions (Signed)
Double your dose of MiraLax.  You can consider continued Fleet enema treatments.  Followup with your GI Dr. See your doctor immediately--or return to the ED--with any new or troubling symptoms including nausea, vomiting, abdominal pain, fevers, weakness, new chest pain, shortness or breath, numbness, or any other concerning symptom.  Constipation in Adults    Constipation is having fewer than 2 bowel movements per week. Usually, the stools are hard. As we grow older, constipation is more common. If you try to fix constipation with laxatives, the problem may get worse. This is because laxatives taken over a long period of time make the colon muscles weaker. A low-fiber diet, not taking in enough fluids, and taking some medicines may make these problems worse. MEDICATIONS THAT MAY CAUSE CONSTIPATION  Water pills (diuretics).   Calcium channel blockers (used to control blood pressure and for the heart).   Certain pain medicines (narcotics).   Anticholinergics.   Anti-inflammatory agents.   Antacids that contain aluminum.  DISEASES THAT CONTRIBUTE TO CONSTIPATION  Diabetes.   Parkinson's disease.   Dementia.   Stroke.   Depression.   Illnesses that cause problems with salt and water metabolism.  HOME CARE INSTRUCTIONS   Constipation is usually best cared for without medicines. Increasing dietary fiber and eating more fruits and vegetables is the best way to manage constipation.   Slowly increase fiber intake to 25 to 38 grams per day. Whole grains, fruits, vegetables, and legumes are good sources of fiber. A dietitian can further help you incorporate high-fiber foods into your diet.   Drink enough water and fluids to keep your urine clear or pale yellow.   A fiber supplement may be added to your diet if you cannot get enough fiber from foods.   Increasing your activities also helps improve regularity.   Suppositories, as suggested by your caregiver, will also help. If you are  using antacids, such as aluminum or calcium containing products, it will be helpful to switch to products containing magnesium if your caregiver says it is okay.   If you have been given a liquid injection (enema) today, this is only a temporary measure. It should not be relied on for treatment of longstanding (chronic) constipation.   Stronger measures, such as magnesium sulfate, should be avoided if possible. This may cause uncontrollable diarrhea. Using magnesium sulfate may not allow you time to make it to the bathroom.  SEEK IMMEDIATE MEDICAL CARE IF:   There is bright red blood in the stool.   The constipation stays for more than 4 days.   There is belly (abdominal) or rectal pain.   You do not seem to be getting better.   You have any questions or concerns.  MAKE SURE YOU:   Understand these instructions.   Will watch your condition.   Will get help right away if you are not doing well or get worse.  Document Released: 06/21/2004 Document Revised: 09/12/2011 Document Reviewed: 08/27/2011 [facility] Patient Information 2012 [place], [place].

## 2012-03-18 NOTE — ED Notes (Signed)
Pt reports 17 days of constipation. Last normal BM June 1st. Pt denies abdominal pain or N/V at present.

## 2012-03-18 NOTE — Telephone Encounter (Signed)
Called Gina with [facility] left message to call back

## 2012-03-19 ENCOUNTER — Telehealth: Payer: Self-pay

## 2012-03-19 DIAGNOSIS — K5900 Constipation, unspecified: Secondary | ICD-10-CM

## 2012-03-19 NOTE — Telephone Encounter (Signed)
Patient informed. 

## 2012-03-19 NOTE — Telephone Encounter (Signed)
Pt called stating he was advised in ER to double daily dosage of miralax and to have GI consult ASAP. Pt is requesting referral and new Rx, please advise.

## 2012-03-19 NOTE — Telephone Encounter (Signed)
Done per emr - urgent

## 2012-03-20 ENCOUNTER — Encounter: Payer: Self-pay | Admitting: Gastroenterology

## 2012-03-20 NOTE — ED Provider Notes (Signed)
I saw and evaluated the patient, reviewed the resident's note and I agree with the findings and plan. Patient with constipation. X-ray showed increased stool burden without obstruction. Patient is well-appearing. His laxative regimen was increased he'll followup with GI  Juliet Rude. Rubin Payor, MD 03/20/12 [phone]

## 2012-03-25 DIAGNOSIS — M549 Dorsalgia, unspecified: Secondary | ICD-10-CM

## 2012-03-25 DIAGNOSIS — K5900 Constipation, unspecified: Secondary | ICD-10-CM

## 2012-03-25 DIAGNOSIS — I10 Essential (primary) hypertension: Secondary | ICD-10-CM

## 2012-03-25 DIAGNOSIS — K5641 Fecal impaction: Secondary | ICD-10-CM

## 2012-03-27 ENCOUNTER — Encounter: Payer: Self-pay | Admitting: Gastroenterology

## 2012-03-27 ENCOUNTER — Ambulatory Visit (INDEPENDENT_AMBULATORY_CARE_PROVIDER_SITE_OTHER): Payer: 59 | Admitting: Gastroenterology

## 2012-03-27 VITALS — BP 114/52 | HR 60 | Ht 68.0 in | Wt 124.4 lb

## 2012-03-27 DIAGNOSIS — K5900 Constipation, unspecified: Secondary | ICD-10-CM

## 2012-03-27 NOTE — Patient Instructions (Addendum)
We will contact [facility] to get records of your most recent colonoscopy and any pathology reports. If most recent colonoscopy greater than 8-9 years, then will need a repeat colonoscopy now for change in bowels, constipation. Decrease your miralax back to once a day, you can adjust the dose as needed. Some medicines (such as tramadol and zocor) can contribute to constipation.

## 2012-03-27 NOTE — Progress Notes (Signed)
HPI: [name] whom I am meeting for the first time today.  Previous patient of [facility] most recently seen by them in 2011 from what I can tell. This was around the time of overt GI bleeding from a large gastric gist   Blood work 3 months ago shows normal CBC, normal complete metabolic profile.  KUB last week shows nonobstructive gas pattern, "moderate stool burden".  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  HE was recommended to continue miralax which he had started about a week prior, he doubled the dose.   Had been on zocor (no changes in doses, old meds) cause constipation.  Started tramadol 2-3 months ago, perhaps before the constipation.  No blood in stool.  No abd pains.  HE has lost 25 pounds in 2-3 years, most around time of surgery.  He had colonoscopy many years ago.  Not sure when it was, probably at least 15 years ago.     Review of systems: Pertinent positive and negative review of systems were noted in the above HPI section. Complete review of systems was performed and was otherwise normal.    Past Medical History  Diagnosis Date  . Hypertension   . MI (myocardial infarction)   . 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 CAD (coronary artery disease) 03/29/2011  . HTN (hypertension) 03/29/2011  . Hyperlipidemia 03/29/2011  . BPH (benign prostatic hypertrophy) 03/29/2011  . GERD (gastroesophageal reflux disease) 03/29/2011  . S/P cholecystectomy 03/29/2011  . 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 Thrombocytopenia 03/29/2011  . Dizziness - giddy 03/29/2011  . Allergic rhinitis, cause unspecified 03/29/2011  . S/P partial gastrectomy 03/29/2011  . Anemia 03/29/2011  . Arthritis   . Ulcer   . Urine incontinence   . Low back pain   . DJD (degenerative joint disease), lumbar 04/01/2011  . Prostate  cancer 03/29/2011    Past Surgical History  Procedure Date  . Heart stent 2010  . Tonsillectomy 1951  . Cholecystectomy 1986  . Stromal tumor     Current Outpatient Prescriptions  Medication Sig Dispense Refill  . aspirin 81 MG EC tablet Take 81 mg by mouth daily.        Marland Kitchen lisinopril (PRINIVIL,ZESTRIL) 10 MG tablet Take 1 tablet (10 mg total) by mouth daily.  90 tablet  3  . Multiple Vitamins-Minerals (ICAPS) CAPS Take 1 capsule by mouth daily.       . nitroGLYCERIN (NITROSTAT) 0.3 MG SL tablet Place 0.3 mg under the tongue every 5 (five) minutes as needed. For chest pain      . pantoprazole (PROTONIX) 20 MG tablet Take 20 mg by mouth daily.      . polyethylene glycol (MIRALAX / GLYCOLAX) packet Take 17 g by mouth daily.      . simvastatin (ZOCOR) 10 MG tablet Take 10 mg by mouth daily.        . Tamsulosin HCl (FLOMAX) 0.4 MG CAPS Take by mouth daily.        . traMADol (ULTRAM) 50 MG tablet Take 50 mg by mouth every 6 (six) hours as needed. For pain      . DISCONTD: fluticasone (FLONASE) 50 MCG/ACT nasal spray Place 2 sprays into the nose daily.        Marland Kitchen DISCONTD: omeprazole (PRILOSEC) 20 MG capsule Take  20 mg by mouth 2 (two) times daily.          Allergies as of 03/27/2012  . (No Known Allergies)    Family History  Problem Relation Age of Onset  . Arthritis Mother   . Cancer Mother     ovarian cancer  . Ovarian cancer Mother   . Arthritis Father   . Cancer Other     Lung cancer    History   Social History  . Marital Status: Widowed    Spouse Name: N/A    Number of Children: N/A  . Years of Education: 16   Occupational History  . retired Acupuncturist    Social History Main Topics  . Smoking status: Former Games developer  . Smokeless tobacco: Never Used   Comment: Stopped smoking in 1962  . Alcohol Use: Yes     occasional  . Drug Use: No  . Sexually Active: Not on file   Other Topics Concern  . Not on file   Social History Narrative  . No narrative on  file       Physical Exam: BP 114/52  Pulse 60  Ht 5\' 8"  (1.727 m)  Wt 124 lb 6.4 oz (56.427 kg)  BMI 18.91 kg/m2 Constitutional: Elderly and somewhat frail however otherwise generally well-appearing Psychiatric: alert and oriented x3 Eyes: extraocular movements intact Mouth: oral pharynx moist, no lesions Neck: supple no lymphadenopathy Cardiovascular: heart regular rate and rhythm Lungs: clear to auscultation bilaterally Abdomen: soft, nontender, nondistended, no obvious ascites, no peritoneal signs, normal bowel sounds Extremities: no lower extremity edema bilaterally Skin: no lesions on visible extremities    Assessment and plan: 78 y.o. male with  recent constipation that seems improved with increasing MiraLax dose  I am going to get records sent over from his previous gastroenterology office, [facility]. I am particularly interested in when he had a recent colonoscopy and any findings. If he has not had one in 8-9 years then given his change in his bowel I think we should repeat it now. He will continue on his MiraLax and knows he can adjust the dose up or down as needed. #3 side effect of tramadol is constipation and he does recall that he started tramadol shortly before his constipation and so perhaps it is causing, contributing to his problems. Zocor, a #2 side effect is constipation as well.

## 2012-04-15 ENCOUNTER — Ambulatory Visit (INDEPENDENT_AMBULATORY_CARE_PROVIDER_SITE_OTHER): Payer: 59 | Admitting: Internal Medicine

## 2012-04-15 ENCOUNTER — Encounter: Payer: Self-pay | Admitting: Internal Medicine

## 2012-04-15 VITALS — BP 100/52 | HR 61 | Temp 97.6°F | Ht 68.0 in | Wt 121.4 lb

## 2012-04-15 DIAGNOSIS — K5900 Constipation, unspecified: Secondary | ICD-10-CM

## 2012-04-15 DIAGNOSIS — M545 Low back pain, unspecified: Secondary | ICD-10-CM

## 2012-04-15 DIAGNOSIS — I10 Essential (primary) hypertension: Secondary | ICD-10-CM

## 2012-04-15 MED ORDER — LISINOPRIL 5 MG PO TABS: 5.0000 mg | ORAL_TABLET | Freq: Every day | ORAL | Status: AC

## 2012-04-15 NOTE — Assessment & Plan Note (Signed)
With recent fall, weakness and relatively lower BP today - to decr the lisinopril to 5 mg

## 2012-04-15 NOTE — Assessment & Plan Note (Signed)
With recent and remote falls noted, hx of L1 compression fx and DDD/DJD lumbar spine, now with pain suggestive of spinal stenosis, with intermittent weakness both legs and fall recent - for MRI LS spine, and ortho referral

## 2012-04-15 NOTE — Patient Instructions (Addendum)
Continue all other medications as before, as stronger pain medication will make the constipation worse Please have the pharmacy call with any refills you may need. You will be contacted regarding the referral for: MRI for the lower back, and orthopedic referral Please decrease the lisinopril to 5 mg per day

## 2012-04-19 ENCOUNTER — Encounter: Payer: Self-pay | Admitting: Internal Medicine

## 2012-04-19 NOTE — Progress Notes (Signed)
Subjective:    Patient ID: Eric Cooley, male    DOB: 08-Feb-1920, 78 y.o.   MRN: [id number]  HPI  Here to f/u, with c/o LBP, has hx of L1 compression fx  - ? Due to fall off horse about 20 yrs ago, more recently with recurrent LBP, intermittent, mild to mod but occas severe 8/10, worse to walk more than just 25 to 50 ft, better to sit, quite functionally limiting in that he cannot completely shopping at a store, did also incidentally have a fall to buttock on getting out of a car 4 days ago.  Also with recent constipation but improved most recently with BM qod adn no abd pain today, or n/v, fever, and Denies worsening reflux, dysphagia, abd pain, n/v, bowel change or blood.  Pt denies chest pain, increased sob or doe, wheezing, orthopnea, PND, increased LE swelling, palpitations, dizziness or syncope, though BP some lower today Past Medical History  Diagnosis Date  . Hypertension   . MI (myocardial infarction)   . 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 CAD (coronary artery disease) 03/29/2011  . HTN (hypertension) 03/29/2011  . Hyperlipidemia 03/29/2011  . BPH (benign prostatic hypertrophy) 03/29/2011  . GERD (gastroesophageal reflux disease) 03/29/2011  . S/P cholecystectomy 03/29/2011  . 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 Thrombocytopenia 03/29/2011  . Dizziness - giddy 03/29/2011  . Allergic rhinitis, cause unspecified 03/29/2011  . S/P partial gastrectomy 03/29/2011  . Anemia 03/29/2011  . Arthritis   . Ulcer   . Urine incontinence   . Low back pain   . DJD (degenerative joint disease), lumbar 04/01/2011  . Prostate cancer 03/29/2011   Past Surgical History  Procedure Date  . Heart stent 2010  . Tonsillectomy 1951  . Cholecystectomy 1986  . Stromal tumor     reports that he has quit smoking. He has never used smokeless tobacco. He reports that he drinks alcohol. He reports that he does not use illicit drugs. family history includes Arthritis  in his father and mother; Cancer in his mother and other; and Ovarian cancer in his mother. No Known Allergies Current Outpatient Prescriptions on File Prior to Visit  Medication Sig Dispense Refill  . aspirin 81 MG EC tablet Take 81 mg by mouth daily.        . Multiple Vitamins-Minerals (ICAPS) CAPS Take 1 capsule by mouth daily.       . nitroGLYCERIN (NITROSTAT) 0.3 MG SL tablet Place 0.3 mg under the tongue every 5 (five) minutes as needed. For chest pain      . pantoprazole (PROTONIX) 20 MG tablet Take 20 mg by mouth daily.      . polyethylene glycol (MIRALAX / GLYCOLAX) packet Take 17 g by mouth daily.      . simvastatin (ZOCOR) 10 MG tablet Take 10 mg by mouth daily.        . Tamsulosin HCl (FLOMAX) 0.4 MG CAPS Take by mouth daily.        . traMADol (ULTRAM) 50 MG tablet Take 50 mg by mouth every 6 (six) hours as needed. For pain      . DISCONTD: fluticasone (FLONASE) 50 MCG/ACT nasal spray Place 2 sprays into the nose daily.        Marland Kitchen DISCONTD: omeprazole (PRILOSEC) 20 MG capsule Take 20 mg by mouth 2 (two) times daily.         Review of Systems Review of Systems  Constitutional: Negative for diaphoresis and unexpected  weight change.  HENT: Negative for drooling and tinnitus.   Eyes: Negative for photophobia and visual disturbance.  Respiratory: Negative for choking and stridor.   Gastrointestinal: Negative for vomiting and blood in stool.  Genitourinary: Negative for hematuria and decreased urine volume.  Musculoskeletal: Negative for gait problem.  Skin: Negative for color change and wound.  Neurological: Negative for tremors and numbness.  Psychiatric/Behavioral: Negative for decreased concentration. The patient is not hyperactive.      Objective:   Physical Exam BP 100/52  Pulse 61  Temp 97.6 F (36.4 C) (Oral)  Ht 5\' 8"  (1.727 m)  Wt 121 lb 6 oz (55.055 kg)  BMI 18.45 kg/m2  SpO2 97% Physical Exam  VS noted Constitutional: Pt appears well-developed and  well-nourished.  HENT: Head: Normocephalic.  Right Ear: External ear normal.  Left Ear: External ear normal.  Eyes: Conjunctivae and EOM are normal. Pupils are equal, round, and reactive to light.  Neck: Normal range of motion. Neck supple.  Cardiovascular: Normal rate and regular rhythm.   Pulmonary/Chest: Effort normal and breath sounds normal.  Abd:  Soft, NT, non-distended, + BS Neurological: Pt is alert. Not confused, motor/dtr/gait intact.  Skin: Skin is warm. No erythema.  Psychiatric: Pt behavior is normal. Thought content normal.  Spine:  nontender    Assessment & Plan:

## 2012-04-19 NOTE — Assessment & Plan Note (Signed)
Improved, Continue all other medications as before,  to f/u any worsening symptoms or concerns  

## 2012-04-20 ENCOUNTER — Telehealth: Payer: Self-pay

## 2012-04-20 NOTE — Telephone Encounter (Signed)
Informed the patient of MD's response.  The patient stated he has had several dizzy spells and has fallen once. He stated that is why is wanted his B-12 checked.

## 2012-04-20 NOTE — Telephone Encounter (Signed)
Chart show vit b12 has been normal on 4 occasions since jan 2011  I think we can forgo further b12 testing at this time

## 2012-04-20 NOTE — Telephone Encounter (Signed)
The patient would like his vitamin B-12 checked.  Please advise if ok and call back number is [phone]

## 2012-04-20 NOTE — Telephone Encounter (Signed)
No need for repeat B12 based on this;  Should proceed with MRI, orthopedic, and decreased lisinopril for BP as per last visit

## 2012-04-21 ENCOUNTER — Telehealth: Payer: Self-pay | Admitting: Gastroenterology

## 2012-04-21 NOTE — Telephone Encounter (Signed)
Forward 21 pages to Dr. Rob Bunting for review on 04-21-12 ym

## 2012-04-21 NOTE — Telephone Encounter (Signed)
Patient informed of MD's instructions

## 2012-04-27 ENCOUNTER — Telehealth: Payer: Self-pay | Admitting: Gastroenterology

## 2012-04-27 ENCOUNTER — Other Ambulatory Visit: Payer: Self-pay | Admitting: Internal Medicine

## 2012-04-27 NOTE — Telephone Encounter (Signed)
I reviewed a large packet of information from [facility] gastroenter[facility]. There are notes from several different physicians in their practice. Most of this centers around a large GI bleed that he suffered 2-3 years ago. Nothing mentions and he previous colonoscopy procedures.   Patty, Can you tell him that I review his records from [facility] gastroenterology and I recommend he have colonoscopy now for his recent change in bowel habits. This can be at the [facility], moderate sedation.

## 2012-04-27 NOTE — Telephone Encounter (Signed)
Pt was transferred to [facility] to schedule Colon and pre visit

## 2012-04-28 NOTE — Telephone Encounter (Signed)
Done erx 

## 2012-04-29 ENCOUNTER — Ambulatory Visit
Admission: RE | Admit: 2012-04-29 | Discharge: 2012-04-29 | Disposition: A | Discharge status: Home / Self Care | Payer: Medicare Other | Source: Ambulatory Visit | Attending: Internal Medicine | Admitting: Internal Medicine

## 2012-04-29 ENCOUNTER — Encounter: Payer: Self-pay | Admitting: Internal Medicine

## 2012-04-29 DIAGNOSIS — M545 Low back pain: Secondary | ICD-10-CM

## 2012-05-05 ENCOUNTER — Ambulatory Visit (AMBULATORY_SURGERY_CENTER): Payer: Medicare Other | Admitting: *Deleted

## 2012-05-05 VITALS — Ht 68.0 in | Wt 123.0 lb

## 2012-05-05 DIAGNOSIS — K5900 Constipation, unspecified: Secondary | ICD-10-CM

## 2012-05-05 MED ORDER — MOVIPREP 100 G PO SOLR: ORAL | Status: DC

## 2012-05-07 DIAGNOSIS — I2510 Atherosclerotic heart disease of native coronary artery without angina pectoris: Secondary | ICD-10-CM

## 2012-05-07 DIAGNOSIS — K635 Polyp of colon: Secondary | ICD-10-CM

## 2012-05-07 HISTORY — DX: Atherosclerotic heart disease of native coronary artery without angina pectoris: I25.10

## 2012-05-07 HISTORY — DX: Polyp of colon: K63.5

## 2012-05-08 ENCOUNTER — Telehealth: Payer: Self-pay | Admitting: Internal Medicine

## 2012-05-08 NOTE — Telephone Encounter (Signed)
Caller: Eric Cooley/Patient; PCP: Oliver Barre; CB#: [phone]; ; ; Call regarding Medication Question;  05-07-12 saw orthopedic doctor for his back pain and was put on tapered dose of Prednisone and just wants to make sure it is ok with his other meds.  I reviewed meds with him and the doctor was aware what he was on and told him it should be fine

## 2012-05-11 NOTE — Telephone Encounter (Signed)
Ok to take with his other meds

## 2012-05-11 NOTE — Telephone Encounter (Signed)
Patient informed. 

## 2012-05-19 ENCOUNTER — Telehealth: Payer: Self-pay

## 2012-05-19 ENCOUNTER — Ambulatory Visit (AMBULATORY_SURGERY_CENTER): Payer: Medicare Other | Admitting: Gastroenterology

## 2012-05-19 VITALS — Temp 96.9°F

## 2012-05-19 DIAGNOSIS — K5900 Constipation, unspecified: Secondary | ICD-10-CM

## 2012-05-19 DIAGNOSIS — Z1211 Encounter for screening for malignant neoplasm of colon: Secondary | ICD-10-CM

## 2012-05-19 NOTE — Progress Notes (Signed)
PATIENT STATING HE DID HAVE A SIP OF WATER AT [facility] ON THE WAY TO THE UNIT. PATIENT THEN QUESTIONED IF THE MOVIPREP WAS TO ACT AS A LAXATIVE. STATED HE HAS NOT HAD A BOWEL MOVEMENT SINCE Sunday. PATIENT QUESTIONED X THREE AND STATES COMPLIANCE WITH TAKING ALL OF MOVIPREP AS DIRECTED LAST NIGHT AT 1700 AND AT 0500 TODAY. PATIENT WITHOUT BOWEL MOVEMENT. DR. Christella Hartigan INFORMED AND WILL SEE THE PATIENT.

## 2012-05-19 NOTE — Telephone Encounter (Signed)
movi prep and instructions given to the pt in the [facility]

## 2012-05-19 NOTE — Progress Notes (Signed)
DR. Christella Hartigan IN TO SEE THE PATIENT WILL RESCHEDULE THROUGH 3RD FLOOR PERSONAL.PATIENT AND SON AWAITING.

## 2012-05-19 NOTE — Progress Notes (Signed)
PATIENT SEEN BY PATTY LEWIS CMA, INSTRUCTIONS GIVEN AND NEW MOVIPREP. PATIENT AND SON VERBALIZING UNDERSTANDING. PATIENT RESCHEDULED FOR 05/22/12 AT 1130.

## 2012-05-20 ENCOUNTER — Telehealth: Payer: Self-pay | Admitting: Gastroenterology

## 2012-05-20 NOTE — Telephone Encounter (Signed)
He should continue the prep

## 2012-05-20 NOTE — Telephone Encounter (Signed)
This phone note was closed in error  Dr Christella Hartigan please see previous note

## 2012-05-20 NOTE — Telephone Encounter (Signed)
Pt took 2 doses of miralax yesterday as well as 2 fleet enemas.  Pt has had 5 bowel movements that are not quite clear yet.  No bowel movements yet today.  His heart was racing this morning 92 pulse  when he woke up, this has not happened before.  Pulse is now normal at 60.   Pt has stent that was placed 2 years ago by [facility] (he doesn't remember who his Dr is)  Should he continue with the prep?

## 2012-05-20 NOTE — Telephone Encounter (Signed)
Pt aware he will call back with any further complaints if chest pain or symptoms that are not normal for him develop he will call 911.

## 2012-05-21 ENCOUNTER — Telehealth: Payer: Self-pay | Admitting: Gastroenterology

## 2012-05-21 NOTE — Telephone Encounter (Signed)
Pt is concerned with overdosing on Miralax. He isn't sure where to fill the cap to measure the dose. He is doing a 2 day prep and will begin Movi Prep tonight. Informed pt if he has a chance to take the bottle and cap of Miralax to a pharmacist to interpret the markings. Also tried to reassure him he can't OD on Miralax; some people take up to 3 doses daily; pt stated understanding.

## 2012-05-21 NOTE — Telephone Encounter (Signed)
I have spoken with this pt; closed the encounter at 3:32pm.

## 2012-05-22 ENCOUNTER — Ambulatory Visit (AMBULATORY_SURGERY_CENTER): Payer: Medicare Other | Admitting: Gastroenterology

## 2012-05-22 ENCOUNTER — Encounter: Payer: Self-pay | Admitting: Gastroenterology

## 2012-05-22 VITALS — BP 130/67 | HR 58 | Temp 96.8°F | Resp 20 | Ht 68.0 in | Wt 123.0 lb

## 2012-05-22 DIAGNOSIS — K5900 Constipation, unspecified: Secondary | ICD-10-CM

## 2012-05-22 DIAGNOSIS — K635 Polyp of colon: Secondary | ICD-10-CM

## 2012-05-22 DIAGNOSIS — D126 Benign neoplasm of colon, unspecified: Secondary | ICD-10-CM

## 2012-05-22 DIAGNOSIS — K5730 Diverticulosis of large intestine without perforation or abscess without bleeding: Secondary | ICD-10-CM

## 2012-05-22 MED ORDER — SODIUM CHLORIDE 0.9 % IV SOLN: 500.0000 mL | INTRAVENOUS | Status: DC

## 2012-05-22 NOTE — Patient Instructions (Addendum)

## 2012-05-22 NOTE — Progress Notes (Signed)
Patient did not experience any of the following events: a burn prior to discharge; a fall within the facility; wrong site/side/patient/procedure/implant event; or a hospital transfer or hospital admission upon discharge from the facility. (G8907) Patient did not have preoperative order for IV antibiotic SSI prophylaxis. (G8918)  

## 2012-05-22 NOTE — Op Note (Signed)
[facility] [address] [place], [place]  [address]  COLONOSCOPY PROCEDURE REPORT  PATIENT:  Eric, Cooley  MR#:  [id number] BIRTHDATE:  Nov 13, 1919, 89 yrs. old  GENDER:  male ENDOSCOPIST:  Rachael Fee, MD REF. BY:  Oliver Barre, M.D. PROCEDURE DATE:  05/22/2012 PROCEDURE:  Colonoscopy with biopsy and snare polypectomy ASA CLASS:  Class III INDICATIONS:  change in bowels, constipation MEDICATIONS:   Fentanyl 37.5 mcg IV, These medications were titrated to patient response per physician's verbal order, Versed 4 mg IV  DESCRIPTION OF PROCEDURE:   After the risks benefits and alternatives of the procedure were thoroughly explained, informed consent was obtained.  Digital rectal exam was performed and revealed no rectal masses.   The LB [id number] endoscope was introduced through the anus and advanced to the cecum, which was identified by both the appendix and ileocecal valve, without limitations.  The quality of the prep was good..  The instrument was then slowly withdrawn as the colon was fully examined. <<PROCEDUREIMAGES>> FINDINGS:  Two diminutive polyps were found. One was in cecum, removed with biopsy forceps, pathology jar 1. The other was in ascending colon, removed with cold snare, pathology jar 1 (see image3).  Moderate diverticulosis was found in the sigmoid to descending colon segments.  This was otherwise a normal examination of the colon (see image5, image2, and image1). Retroflexed views in the rectum revealed no abnormalities. COMPLICATIONS:  None  ENDOSCOPIC IMPRESSION: 1) Two small polyps, both removed and both sent to pathology 2) Moderate diverticulosis in the sigmoid to descending colon segments 3) Otherwise normal examination  RECOMMENDATIONS: Await pathology for final recommendations.  Continue two doses of miralax daily.  ______________________________ Rachael Fee, MD  n. eSIGNED:   Rachael Fee at 05/22/2012 11:59  AM  Corwin Levins, [phone]

## 2012-05-25 ENCOUNTER — Telehealth: Payer: Self-pay | Admitting: *Deleted

## 2012-05-25 NOTE — Telephone Encounter (Signed)
  Follow up Call-  Call back number 05/22/2012  Post procedure Call Back phone  # [phone],   [phone]  Permission to leave phone message Yes  comments only if working     Patient questions:  Do you have a fever, pain , or abdominal swelling? no Pain Score  0 *  Have you tolerated food without any problems? yes  Have you been able to return to your normal activities? yes  Do you have any questions about your discharge instructions: Diet   no Medications  no Follow up visit  no  Do you have questions or concerns about your Care? no  Actions: * If pain score is 4 or above: No action needed, pain <4.

## 2012-05-28 ENCOUNTER — Encounter: Payer: Self-pay | Admitting: Gastroenterology

## 2012-06-16 ENCOUNTER — Other Ambulatory Visit: Payer: Self-pay | Admitting: Internal Medicine

## 2012-06-16 NOTE — Telephone Encounter (Signed)
Done erx 

## 2012-07-16 ENCOUNTER — Ambulatory Visit (INDEPENDENT_AMBULATORY_CARE_PROVIDER_SITE_OTHER): Payer: Medicare Other | Admitting: Internal Medicine

## 2012-07-16 ENCOUNTER — Encounter: Payer: Self-pay | Admitting: Internal Medicine

## 2012-07-16 VITALS — BP 130/70 | HR 55 | Temp 96.6°F | Ht 68.0 in | Wt 121.0 lb

## 2012-07-16 DIAGNOSIS — Z0000 Encounter for general adult medical examination without abnormal findings: Secondary | ICD-10-CM

## 2012-07-16 DIAGNOSIS — E785 Hyperlipidemia, unspecified: Secondary | ICD-10-CM

## 2012-07-16 DIAGNOSIS — I10 Essential (primary) hypertension: Secondary | ICD-10-CM

## 2012-07-16 DIAGNOSIS — K5900 Constipation, unspecified: Secondary | ICD-10-CM

## 2012-07-16 DIAGNOSIS — M545 Low back pain: Secondary | ICD-10-CM

## 2012-07-16 NOTE — Progress Notes (Signed)
Subjective:    Patient ID: Eric Cooley, male    DOB: 02-24-1920, 78 y.o.   MRN: [id number]  HPI  Here to f/u; overall doing ok,  Pt denies chest pain, increased sob or doe, wheezing, orthopnea, PND, increased LE swelling, palpitations, dizziness or syncope.  Pt denies new neurological symptoms such as new headache, or facial or extremity weakness or numbness   Pt denies polydipsia, polyuria, or low sugar symptoms such as weakness or confusion improved with po intake.  Pt states overall good compliance with meds, trying to follow lower cholesterol diet, wt overall stable but little exercise however.  Pt continues to have recurring LBP without change in severity, bowel or bladder change, fever, wt loss,  worsening LE pain/numbness/weakness, gait change or falls, but overall some improved, and using alleve on occasion.  Does have 20 yr hx of occas left 2nd toe pain without trauma, swelling.   Brother has gout.  No toe pain at this time today  Due for flu shot.  Constipation has been improved recently, now only taking the miralax prn. Past Medical History  Diagnosis Date  . Hypertension   . MI (myocardial infarction)   . 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 CAD (coronary artery disease) 03/29/2011  . HTN (hypertension) 03/29/2011  . Hyperlipidemia 03/29/2011  . BPH (benign prostatic hypertrophy) 03/29/2011  . GERD (gastroesophageal reflux disease) 03/29/2011  . S/P cholecystectomy 03/29/2011  . 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 Thrombocytopenia 03/29/2011  . Dizziness - giddy 03/29/2011  . Allergic rhinitis, cause unspecified 03/29/2011  . S/P partial gastrectomy 03/29/2011  . Anemia 03/29/2011  . Arthritis   . Ulcer   . Urine incontinence   . Low back pain   . DJD (degenerative joint disease), lumbar 04/01/2011  . Prostate cancer 03/29/2011   Past Surgical History  Procedure Date  . Heart stent 2010  . Tonsillectomy 1951  . Cholecystectomy 1986  .  Stromal tumor     reports that he has quit smoking. He has never used smokeless tobacco. He reports that he drinks about .6 ounces of alcohol per week. He reports that he does not use illicit drugs. family history includes Arthritis in his father and mother; Cancer in his mother and other; and Ovarian cancer in his mother.  There is no history of Colon cancer. No Known Allergies Current Outpatient Prescriptions on File Prior to Visit  Medication Sig Dispense Refill  . aspirin 81 MG EC tablet Take 81 mg by mouth daily.        Marland Kitchen lisinopril (PRINIVIL,ZESTRIL) 5 MG tablet Take 1 tablet (5 mg total) by mouth daily.  90 tablet  3  . Multiple Vitamins-Minerals (ICAPS) CAPS Take 1 capsule by mouth daily.       . nitroGLYCERIN (NITROSTAT) 0.3 MG SL tablet Place 0.3 mg under the tongue every 5 (five) minutes as needed. For chest pain      . pantoprazole (PROTONIX) 20 MG tablet Take 20 mg by mouth daily.      . polyethylene glycol (MIRALAX / GLYCOLAX) packet Take 17 g by mouth daily.      . simvastatin (ZOCOR) 10 MG tablet Take 10 mg by mouth daily.        . Tamsulosin HCl (FLOMAX) 0.4 MG CAPS Take by mouth daily.        . traMADol (ULTRAM) 50 MG tablet Take 50 mg by mouth every 6 (six) hours as needed. For pain      .  traMADol (ULTRAM) 50 MG tablet take 1 tablet by mouth every 6 hours if needed for pain  60 tablet  2  . DISCONTD: fluticasone (FLONASE) 50 MCG/ACT nasal spray Place 2 sprays into the nose daily.        Marland Kitchen DISCONTD: omeprazole (PRILOSEC) 20 MG capsule Take 20 mg by mouth 2 (two) times daily.         Review of Systems  Constitutional: Negative for diaphoresis and unexpected weight change.  HENT: Negative for tinnitus.   Eyes: Negative for photophobia and visual disturbance.  Respiratory: Negative for choking and stridor.   Gastrointestinal: Negative for vomiting and blood in stool.  Genitourinary: Negative for hematuria and decreased urine volume.  Musculoskeletal: Negative for gait  problem.  Skin: Negative for color change and wound.  Neurological: Negative for tremors and numbness.  Psychiatric/Behavioral: Negative for decreased concentration. The patient is not hyperactive.       Objective:   Physical Exam BP 130/70  Pulse 55  Temp 96.6 F (35.9 C) (Oral)  Ht 5\' 8"  (1.727 m)  Wt 121 lb (54.885 kg)  BMI 18.40 kg/m2  SpO2 98% Physical Exam  VS noted Constitutional: Pt appears well-developed and well-nourished.  HENT: Head: Normocephalic.  Right Ear: External ear normal.  Left Ear: External ear normal.  Eyes: Conjunctivae and EOM are normal. Pupils are equal, round, and reactive to light.  Neck: Normal range of motion. Neck supple.  Cardiovascular: Normal rate and regular rhythm.   Pulmonary/Chest: Effort normal and breath sounds normal.  Abd:  Soft, NT, non-distended, + BS Neurological: Pt is alert. Not confused , motor/dtr intact, gets on exam table rather sprightly Skin: Skin is warm. No erythema.  Psychiatric: Pt behavior is normal. Thought content normal.  - quite sharp for age    Assessment & Plan:

## 2012-07-16 NOTE — Assessment & Plan Note (Signed)
stable overall by hx and exam, 2013 MRI reviewed with pt, and pt to continue medical treatment as before, has seen surgury, for alleve prn

## 2012-07-16 NOTE — Assessment & Plan Note (Signed)
stable overall by hx and exam, most recent data reviewed with pt, and pt to continue medical treatment as before Lab Results  Component Value Date   [facility]  Value: 43        Total Cholesterol/HDL:CHD Risk Coronary Heart Disease Risk Table                     Men   Women  1/2 Average Risk   3.4   3.3  Average Risk       5.0   4.4  2 X Average Risk   9.6   7.1  3 X Average Risk  23.4   11.0        Use the calculated Patient Ratio above and the CHD Risk Table to determine the patient's CHD Risk.        ATP III CLASSIFICATION (LDL):  <100     mg/dL   Optimal  409-811  mg/dL   Near or Above                    Optimal  130-159  mg/dL   Borderline  914-782  mg/dL   High  >956     mg/dL   Very High 11/07/3084

## 2012-07-16 NOTE — Patient Instructions (Addendum)
You had the flu shot today Continue all other medications as before - there are no change today Please have the pharmacy call with any refills you may need. Please keep your appointments with your specialists as you have planned Please return in 6 mo with Lab testing done 3-5 days before

## 2012-07-16 NOTE — Assessment & Plan Note (Signed)
stable overall by hx and exam, most recent data reviewed with pt, and pt to continue medical treatment as before Lab Results  Component Value Date   WBC 5.6 12/19/2011   HGB 14.4 12/19/2011   HCT 43.2 12/19/2011   PLT 176.0 12/19/2011   GLUCOSE 102* 12/19/2011   CHOL 206* 10/10/2011   TRIG 84.0 10/10/2011   HDL 121.30 10/10/2011   LDLDIRECT 56.7 10/10/2011   LDLCALC  Value: 43        Total Cholesterol/HDL:CHD Risk Coronary Heart Disease Risk Table                     Men   Women  1/2 Average Risk   3.4   3.3  Average Risk       5.0   4.4  2 X Average Risk   9.6   7.1  3 X Average Risk  23.4   11.0        Use the calculated Patient Ratio above and the CHD Risk Table to determine the patient's CHD Risk.        ATP III CLASSIFICATION (LDL):  <100     mg/dL   Optimal  469-629  mg/dL   Near or Above                    Optimal  130-159  mg/dL   Borderline  528-413  mg/dL   High  >244     mg/dL   Very High 0/10/270   ALT 26 12/19/2011   AST 29 12/19/2011   NA 143 12/19/2011   K 4.7 12/19/2011   CL 106 12/19/2011   CREATININE 1.2 12/19/2011   BUN 18 12/19/2011   CO2 28 12/19/2011   TSH 3.92 10/10/2011   INR 1.04 10/14/2009   HGBA1C  Value: 6.0 (NOTE) The ADA recommends the following therapeutic goal for glycemic control related to Hgb A1c measurement: Goal of therapy: <6.5 Hgb A1c  Reference: American Diabetes Association: Clinical Practice Recommendations 2010, Diabetes Care, 2010, 33: (Suppl  1). 04/21/2009

## 2012-07-16 NOTE — Assessment & Plan Note (Signed)
stable overall by hx and exam, most recent data reviewed with pt, and pt to continue medical treatment as before BP Readings from Last 3 Encounters:  07/16/12 130/70  05/22/12 130/67  04/15/12 100/52

## 2012-08-11 ENCOUNTER — Telehealth: Payer: Self-pay

## 2012-08-11 NOTE — Telephone Encounter (Signed)
Patient informed. 

## 2012-08-11 NOTE — Telephone Encounter (Signed)
Very common, most likely related to cervical spine arthritis or degenerative disc, not serious, ok to follow for now

## 2012-08-11 NOTE — Telephone Encounter (Signed)
The patient called to inform that when bending his head he is hearing a popping sound in his neck.  Stated he is having no other symptoms.  Please advise call back number is [phone]

## 2012-09-14 ENCOUNTER — Other Ambulatory Visit: Payer: Self-pay | Admitting: Internal Medicine

## 2012-09-24 ENCOUNTER — Telehealth: Payer: Self-pay | Admitting: Internal Medicine

## 2012-09-24 NOTE — Telephone Encounter (Signed)
Patient informed. 

## 2012-09-24 NOTE — Telephone Encounter (Signed)
Ok to see Eric Cooley

## 2012-09-24 NOTE — Telephone Encounter (Signed)
Patient Information:  Caller Name: Tavien  Phone: [phone]  Patient: Carder, Yin  Gender: Male  DOB: [birth year]  Age: 78 Years  PCP: Oliver Barre (Adults only)  Office Follow Up:  Does the office need to follow up with this patient?: No  Instructions For The Office: N/A  RN Note:  The pain is worse in early am hours.  Symptoms  Reason For Call & Symptoms: Has gout in his left little toe, asking for an appt.  Reviewed Health History In EMR: Yes  Reviewed Medications In EMR: Yes  Reviewed Allergies In EMR: Yes  Reviewed Surgeries / Procedures: Yes  Date of Onset of Symptoms: 09/21/2012  Guideline(s) Used:  Foot Pain  Disposition Per Guideline:   See Within 3 Days in Office  Reason For Disposition Reached:   Moderate pain (e.g., interferes with normal activities, limping) and present > 3 days  Advice Given:  N/A  Appointment Scheduled:  09/25/2012 11:15:00 Appointment Scheduled Provider:  Oliver Barre (Adults only)

## 2012-09-25 ENCOUNTER — Encounter: Payer: Self-pay | Admitting: Internal Medicine

## 2012-09-25 ENCOUNTER — Ambulatory Visit (INDEPENDENT_AMBULATORY_CARE_PROVIDER_SITE_OTHER): Payer: Medicare Other | Admitting: Internal Medicine

## 2012-09-25 VITALS — BP 130/64 | HR 66 | Temp 97.0°F | Ht 68.0 in | Wt 134.2 lb

## 2012-09-25 DIAGNOSIS — Z23 Encounter for immunization: Secondary | ICD-10-CM

## 2012-09-25 DIAGNOSIS — M545 Low back pain: Secondary | ICD-10-CM

## 2012-09-25 DIAGNOSIS — M79675 Pain in left toe(s): Secondary | ICD-10-CM

## 2012-09-25 DIAGNOSIS — I10 Essential (primary) hypertension: Secondary | ICD-10-CM

## 2012-09-25 DIAGNOSIS — M79609 Pain in unspecified limb: Secondary | ICD-10-CM

## 2012-09-25 MED ORDER — TRAMADOL HCL 50 MG PO TABS: 50.0000 mg | ORAL_TABLET | Freq: Four times a day (QID) | ORAL | Status: DC | PRN

## 2012-09-25 NOTE — Progress Notes (Signed)
Subjective:    Patient ID: Eric Cooley, male    DOB: 07/01/1920, 78 y.o.   MRN: [id number]  HPI  Here with sharp and dull pain to left 4th toe and near midfoot, has been mild intermittent for several months (hard to be exact), but now more severe in last 10 days, seems even worse at night and will wake him at 4 am most nights for the past 10 nights, some better to stand and walk? But not clear, remembers and episode of toe pain 25 yrs ago that he was told was gout, but this pain not assoc with red/swelling/tender, but still wondering if might be.  No fever, recent trauma, and no clear worsening back or radicular pain, and no clear claudication type pain.  Pt denies chest pain, increased sob or doe, wheezing, orthopnea, PND, increased LE swelling, palpitations, dizziness or syncope. Pt denies new neurological symptoms such as new headache, or facial or extremity weakness or numbness   Pt denies polydipsia, polyuria, Past Medical History  Diagnosis Date  . Hypertension   . MI (myocardial infarction)   . 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 CAD (coronary artery disease) 03/29/2011  . HTN (hypertension) 03/29/2011  . Hyperlipidemia 03/29/2011  . BPH (benign prostatic hypertrophy) 03/29/2011  . GERD (gastroesophageal reflux disease) 03/29/2011  . S/P cholecystectomy 03/29/2011  . 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 Thrombocytopenia 03/29/2011  . Dizziness - giddy 03/29/2011  . Allergic rhinitis, cause unspecified 03/29/2011  . S/P partial gastrectomy 03/29/2011  . Anemia 03/29/2011  . Arthritis   . Ulcer   . Urine incontinence   . Low back pain   . DJD (degenerative joint disease), lumbar 04/01/2011  . Prostate cancer 03/29/2011   Past Surgical History  Procedure Date  . Heart stent 2010  . Tonsillectomy 1951  . Cholecystectomy 1986  . Stromal tumor     reports that he has quit smoking. He has never used smokeless tobacco. He reports that he drinks  about .6 ounces of alcohol per week. He reports that he does not use illicit drugs. family history includes Arthritis in his father and mother; Cancer in his mother and other; and Ovarian cancer in his mother.  There is no history of Colon cancer. No Known Allergies Current Outpatient Prescriptions on File Prior to Visit  Medication Sig Dispense Refill  . aspirin 81 MG EC tablet Take 81 mg by mouth daily.        Marland Kitchen lisinopril (PRINIVIL,ZESTRIL) 5 MG tablet Take 1 tablet (5 mg total) by mouth daily.  90 tablet  3  . Multiple Vitamins-Minerals (ICAPS) CAPS Take 1 capsule by mouth daily.       . naproxen sodium (ALEVE) 220 MG tablet 1 tablet every 8 to 12 hours as needed-alternates with Tramadol      . nitroGLYCERIN (NITROSTAT) 0.3 MG SL tablet Place 0.3 mg under the tongue every 5 (five) minutes as needed. For chest pain      . pantoprazole (PROTONIX) 20 MG tablet Take 20 mg by mouth daily.      . polyethylene glycol (MIRALAX / GLYCOLAX) packet Take 17 g by mouth daily.      . simvastatin (ZOCOR) 10 MG tablet Take 10 mg by mouth daily.        . Tamsulosin HCl (FLOMAX) 0.4 MG CAPS Take by mouth daily.        . [DISCONTINUED] fluticasone (FLONASE) 50 MCG/ACT nasal spray Place 2 sprays into  the nose daily.        . [DISCONTINUED] omeprazole (PRILOSEC) 20 MG capsule Take 20 mg by mouth 2 (two) times daily.          Review of Systems  Constitutional: Negative for diaphoresis and unexpected weight change.  HENT: Negative for tinnitus.   Eyes: Negative for photophobia and visual disturbance.  Respiratory: Negative for choking and stridor.   Gastrointestinal: Negative for vomiting and blood in stool.  Genitourinary: Negative for hematuria and decreased urine volume.  Musculoskeletal: Negative for gait problem.  Skin: Negative for color change and wound.  Neurological: Negative for tremors and numbness.  Psychiatric/Behavioral: Negative for decreased concentration. The patient is not hyperactive.        Objective:   Physical Exam BP 130/64  Pulse 66  Temp 97 F (36.1 C) (Oral)  Ht 5\' 8"  (1.727 m)  Wt 134 lb 4 oz (60.895 kg)  BMI 20.41 kg/m2  SpO2 97% Physical Exam  VS noted Constitutional: Pt appears well-developed and well-nourished.  HENT: Head: Normocephalic.  Right Ear: External ear normal.  Left Ear: External ear normal.  Eyes: Conjunctivae and EOM are normal. Pupils are equal, round, and reactive to light.  Neck: Normal range of motion. Neck supple.  Cardiovascular: Normal rate and regular rhythm.   Pulmonary/Chest: Effort normal and breath sounds decreased.  Neurological: Pt is alert. Not confused , LE motor/gait intact Skin: Skin is warm. No erythema. Left 4th toe nontender, no swelling/ulcer/color change Dorsalis pedis bilat trace bilat Psychiatric: Pt behavior is normal. Thought content normal.     Assessment & Plan:

## 2012-09-25 NOTE — Assessment & Plan Note (Addendum)
Not c/w gout by exam, cant r/o neurovascular problem, also has long nails needing trimmed; for pain med qhs or prn, also LE arterial dopplers, and refer podiatry

## 2012-09-25 NOTE — Patient Instructions (Addendum)
You had the flu shot today There is no evidence for gout today, but we cant say there might not be nerve or circulation problem You will be contacted regarding the referral for: Lower extremity arterial dopplers (leg circulation test), and podiatry Continue all other medications as before, including the tramadol for pain Thank you for enrolling in MyChart. Please follow the instructions below to securely access your online medical record. MyChart allows you to send messages to your doctor, view your test results, renew your prescriptions, schedule appointments, and more. To Log into MyChart, please go to [email or web address], and your Username is: Eric Cooley Physicist, medical)

## 2012-09-26 ENCOUNTER — Encounter: Payer: Self-pay | Admitting: Internal Medicine

## 2012-09-26 NOTE — Assessment & Plan Note (Signed)
Cant r/o completely radicular pain, consider further eval for persistent foot pain and/or other eval as above not helpful

## 2012-09-26 NOTE — Assessment & Plan Note (Signed)
stable overall by hx and exam, most recent data reviewed with pt, and pt to continue medical treatment as before BP Readings from Last 3 Encounters:  09/25/12 130/64  07/16/12 130/70  05/22/12 130/67

## 2012-09-29 ENCOUNTER — Encounter (INDEPENDENT_AMBULATORY_CARE_PROVIDER_SITE_OTHER): Payer: Medicare Other

## 2012-09-29 DIAGNOSIS — M79675 Pain in left toe(s): Secondary | ICD-10-CM

## 2012-09-29 DIAGNOSIS — I739 Peripheral vascular disease, unspecified: Secondary | ICD-10-CM

## 2013-01-14 ENCOUNTER — Other Ambulatory Visit (INDEPENDENT_AMBULATORY_CARE_PROVIDER_SITE_OTHER): Payer: Medicare Other

## 2013-01-14 ENCOUNTER — Encounter: Payer: Self-pay | Admitting: Internal Medicine

## 2013-01-14 ENCOUNTER — Ambulatory Visit (INDEPENDENT_AMBULATORY_CARE_PROVIDER_SITE_OTHER): Payer: Medicare Other | Admitting: Internal Medicine

## 2013-01-14 VITALS — BP 110/72 | HR 81 | Temp 97.0°F | Ht 68.0 in | Wt 140.4 lb

## 2013-01-14 DIAGNOSIS — Z0000 Encounter for general adult medical examination without abnormal findings: Secondary | ICD-10-CM

## 2013-01-14 DIAGNOSIS — I10 Essential (primary) hypertension: Secondary | ICD-10-CM

## 2013-01-14 DIAGNOSIS — E785 Hyperlipidemia, unspecified: Secondary | ICD-10-CM

## 2013-01-14 LAB — URINALYSIS, ROUTINE W REFLEX MICROSCOPIC
Bilirubin Urine: NEGATIVE
Total Protein, Urine: NEGATIVE
Urine Glucose: NEGATIVE
Urobilinogen, UA: 0.2 (ref 0.0–1.0)

## 2013-01-14 LAB — CBC WITH DIFFERENTIAL/PLATELET
Basophils Absolute: 0 10*3/uL (ref 0.0–0.1)
Basophils Relative: 0.4 % (ref 0.0–3.0)
Eosinophils Absolute: 0 10*3/uL (ref 0.0–0.7)
Lymphocytes Relative: 20.5 % (ref 12.0–46.0)
MCHC: 33.3 g/dL (ref 30.0–36.0)
MCV: 99 fl (ref 78.0–100.0)
Monocytes Absolute: 0.4 10*3/uL (ref 0.1–1.0)
Neutrophils Relative %: 71.5 % (ref 43.0–77.0)
RDW: 14.4 % (ref 11.5–14.6)

## 2013-01-14 LAB — HEPATIC FUNCTION PANEL
ALT: 20 U/L (ref 0–53)
AST: 26 U/L (ref 0–37)
Alkaline Phosphatase: 61 U/L (ref 39–117)
Bilirubin, Direct: 0.1 mg/dL (ref 0.0–0.3)
Total Bilirubin: 0.5 mg/dL (ref 0.3–1.2)
Total Protein: 6.7 g/dL (ref 6.0–8.3)

## 2013-01-14 LAB — LIPID PANEL
Cholesterol: 208 mg/dL — ABNORMAL HIGH (ref 0–200)
Total CHOL/HDL Ratio: 2
VLDL: 18 mg/dL (ref 0.0–40.0)

## 2013-01-14 LAB — BASIC METABOLIC PANEL
CO2: 28 mEq/L (ref 19–32)
Chloride: 104 mEq/L (ref 96–112)
Potassium: 4.2 mEq/L (ref 3.5–5.1)
Sodium: 139 mEq/L (ref 135–145)

## 2013-01-14 NOTE — Patient Instructions (Addendum)
Please continue all other medications as before, and refills have been done if requested. Please go to the LAB in the Basement (turn left off the elevator) for the tests to be done today You will be contacted by phone if any changes need to be made immediately.  Otherwise, you will receive a letter about your results with an explanation, but please check with MyChart first. Thank you for enrolling in MyChart. Please follow the instructions below to securely access your online medical record. MyChart allows you to send messages to your doctor, view your test results, renew your prescriptions, schedule appointments, and more. To Log into MyChart, please go to [email or web address], and your Username is: [email or web address] (password [email or web address]) Please return in 6 months, or sooner if needed

## 2013-01-14 NOTE — Progress Notes (Signed)
Subjective:    Patient ID: Eric Cooley, male    DOB: 02/28/1920, 79 y.o.   MRN: [id number]  HPI  Here for wellness and f/u;  Overall doing ok;  Pt denies CP, worsening SOB, DOE, wheezing, orthopnea, PND, worsening LE edema, palpitations, dizziness or syncope.  Pt denies neurological change such as new headache, facial or extremity weakness.  Pt denies polydipsia, polyuria, or low sugar symptoms. Pt states overall good compliance with treatment and medications, good tolerability, and has been trying to follow lower cholesterol diet.  Pt denies worsening depressive symptoms, suicidal ideation or panic. No fever, night sweats, wt loss, loss of appetite, or other constitutional symptoms.  Pt states good ability with ADL's, has low fall risk, home safety reviewed and adequate, no other significant changes in hearing or vision, and only occasionally active with exercise.  Does have occas leg cramp and does not like the tramadol as it seems to causse constipatoin. Pt continues to have recurring LBP without change in severity, bowel or bladder change, fever, wt loss,  worsening LE pain/numbness/weakness, gait change or falls, but gets by with tylenol and alleve prn Past Medical History  Diagnosis Date  . Hypertension   . MI (myocardial infarction)   . 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 CAD (coronary artery disease) 03/29/2011  . HTN (hypertension) 03/29/2011  . Hyperlipidemia 03/29/2011  . BPH (benign prostatic hypertrophy) 03/29/2011  . GERD (gastroesophageal reflux disease) 03/29/2011  . S/P cholecystectomy 03/29/2011  . 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 Thrombocytopenia 03/29/2011  . Dizziness - giddy 03/29/2011  . Allergic rhinitis, cause unspecified 03/29/2011  . S/P partial gastrectomy 03/29/2011  . Anemia 03/29/2011  . Arthritis   . Ulcer   . Urine incontinence   . Low back pain   . DJD (degenerative joint disease), lumbar 04/01/2011  . Prostate  cancer 03/29/2011   Past Surgical History  Procedure Laterality Date  . Heart stent  2010  . Tonsillectomy  1951  . Cholecystectomy  1986  . Stromal tumor      reports that he has quit smoking. He has never used smokeless tobacco. He reports that he drinks about 0.6 ounces of alcohol per week. He reports that he does not use illicit drugs. family history includes Arthritis in his father and mother; Cancer in his mother and other; and Ovarian cancer in his mother.  There is no history of Colon cancer. No Known Allergies Current Outpatient Prescriptions on File Prior to Visit  Medication Sig Dispense Refill  . aspirin 81 MG EC tablet Take 81 mg by mouth daily.        Marland Kitchen lisinopril (PRINIVIL,ZESTRIL) 5 MG tablet Take 1 tablet (5 mg total) by mouth daily.  90 tablet  3  . Multiple Vitamins-Minerals (ICAPS) CAPS Take 1 capsule by mouth daily.       . nitroGLYCERIN (NITROSTAT) 0.3 MG SL tablet Place 0.3 mg under the tongue every 5 (five) minutes as needed. For chest pain      . pantoprazole (PROTONIX) 20 MG tablet Take 20 mg by mouth daily.      . simvastatin (ZOCOR) 10 MG tablet Take 10 mg by mouth daily.        . Tamsulosin HCl (FLOMAX) 0.4 MG CAPS Take by mouth daily.        . polyethylene glycol (MIRALAX / GLYCOLAX) packet Take 17 g by mouth daily.      . [DISCONTINUED] fluticasone (FLONASE) 50 MCG/ACT  nasal spray Place 2 sprays into the nose daily.        . [DISCONTINUED] omeprazole (PRILOSEC) 20 MG capsule Take 20 mg by mouth 2 (two) times daily.         No current facility-administered medications on file prior to visit.  ' Review of Systems Constitutional: Negative for diaphoresis, activity change, appetite change or unexpected weight change.  HENT: Negative for hearing loss, ear pain, facial swelling, mouth sores and neck stiffness.   Eyes: Negative for pain, redness and visual disturbance.  Respiratory: Negative for shortness of breath and wheezing.   Cardiovascular: Negative for  chest pain and palpitations.  Gastrointestinal: Negative for diarrhea, blood in stool, abdominal distention or other pain Genitourinary: Negative for hematuria, flank pain or change in urine volume.  Musculoskeletal: Negative for myalgias and joint swelling.  Skin: Negative for color change and wound.  Neurological: Negative for syncope and numbness. other than noted Hematological: Negative for adenopathy.  Psychiatric/Behavioral: Negative for hallucinations, self-injury, decreased concentration and agitation.      Objective:   Physical Exam BP 110/72  Pulse 81  Temp(Src) 97 F (36.1 C) (Oral)  Ht 5\' 8"  (1.727 m)  Wt 140 lb 6 oz (63.674 kg)  BMI 21.35 kg/m2  SpO2 95% VS noted, frail but walks without cane or walker, able to ascend exam table without assist Constitutional: Pt is oriented to person, place, and time. Appears well-developed and well-nourished.  Head: Normocephalic and atraumatic.  Right Ear: External ear normal.  Left Ear: External ear normal.  Nose: Nose normal.  Mouth/Throat: Oropharynx is clear and moist.  Eyes: Conjunctivae and EOM are normal. Pupils are equal, round, and reactive to light.  Neck: Normal range of motion. Neck supple. No JVD present. No tracheal deviation present.  Cardiovascular: Normal rate, regular rhythm, normal heart sounds and intact distal pulses.   Pulmonary/Chest: Effort normal and breath sounds normal.  Abdominal: Soft. Bowel sounds are normal. There is no tenderness. No HSM  Musculoskeletal: Normal range of motion. Exhibits no edema.  Lymphadenopathy:  Has no cervical adenopathy.  Neurological: Pt is alert and oriented to person, place, and time. Pt has normal reflexes. No cranial nerve deficit.  Skin: Skin is warm and dry. No rash noted.  Psychiatric:  Has  normal mood and affect. Behavior is normal.     Assessment & Plan:

## 2013-01-14 NOTE — Assessment & Plan Note (Signed)

## 2013-04-12 ENCOUNTER — Other Ambulatory Visit: Payer: Self-pay | Admitting: Internal Medicine

## 2013-04-16 ENCOUNTER — Emergency Department (HOSPITAL_COMMUNITY): Payer: Medicare Other

## 2013-04-16 ENCOUNTER — Encounter (HOSPITAL_COMMUNITY): Payer: Self-pay | Admitting: *Deleted

## 2013-04-16 ENCOUNTER — Inpatient Hospital Stay (HOSPITAL_COMMUNITY)
Admission: EM | Admit: 2013-04-16 | Discharge: 2013-04-20 | DRG: 086 | Disposition: A | Discharge status: Skilled Nursing Facility | Payer: Medicare Other | Attending: General Surgery | Admitting: General Surgery

## 2013-04-16 DIAGNOSIS — R404 Transient alteration of awareness: Secondary | ICD-10-CM | POA: Diagnosis present

## 2013-04-16 DIAGNOSIS — S066X9A Traumatic subarachnoid hemorrhage with loss of consciousness of unspecified duration, initial encounter: Secondary | ICD-10-CM

## 2013-04-16 DIAGNOSIS — S43036A Inferior dislocation of unspecified humerus, initial encounter: Secondary | ICD-10-CM | POA: Diagnosis present

## 2013-04-16 DIAGNOSIS — IMO0002 Reserved for concepts with insufficient information to code with codable children: Secondary | ICD-10-CM

## 2013-04-16 DIAGNOSIS — N401 Enlarged prostate with lower urinary tract symptoms: Secondary | ICD-10-CM | POA: Diagnosis present

## 2013-04-16 DIAGNOSIS — S0180XA Unspecified open wound of other part of head, initial encounter: Secondary | ICD-10-CM | POA: Diagnosis present

## 2013-04-16 DIAGNOSIS — I2510 Atherosclerotic heart disease of native coronary artery without angina pectoris: Secondary | ICD-10-CM | POA: Diagnosis present

## 2013-04-16 DIAGNOSIS — S43006A Unspecified dislocation of unspecified shoulder joint, initial encounter: Secondary | ICD-10-CM | POA: Diagnosis present

## 2013-04-16 DIAGNOSIS — Z9861 Coronary angioplasty status: Secondary | ICD-10-CM

## 2013-04-16 DIAGNOSIS — S01119A Laceration without foreign body of unspecified eyelid and periocular area, initial encounter: Secondary | ICD-10-CM | POA: Diagnosis present

## 2013-04-16 DIAGNOSIS — I10 Essential (primary) hypertension: Secondary | ICD-10-CM | POA: Diagnosis present

## 2013-04-16 DIAGNOSIS — J309 Allergic rhinitis, unspecified: Secondary | ICD-10-CM | POA: Diagnosis present

## 2013-04-16 DIAGNOSIS — R32 Unspecified urinary incontinence: Secondary | ICD-10-CM | POA: Diagnosis present

## 2013-04-16 DIAGNOSIS — E785 Hyperlipidemia, unspecified: Secondary | ICD-10-CM | POA: Diagnosis present

## 2013-04-16 DIAGNOSIS — Y92009 Unspecified place in unspecified non-institutional (private) residence as the place of occurrence of the external cause: Secondary | ICD-10-CM

## 2013-04-16 DIAGNOSIS — S43004A Unspecified dislocation of right shoulder joint, initial encounter: Secondary | ICD-10-CM

## 2013-04-16 DIAGNOSIS — N138 Other obstructive and reflux uropathy: Secondary | ICD-10-CM | POA: Diagnosis present

## 2013-04-16 DIAGNOSIS — S01111A Laceration without foreign body of right eyelid and periocular area, initial encounter: Secondary | ICD-10-CM

## 2013-04-16 DIAGNOSIS — W010XXA Fall on same level from slipping, tripping and stumbling without subsequent striking against object, initial encounter: Secondary | ICD-10-CM | POA: Diagnosis present

## 2013-04-16 DIAGNOSIS — S41109A Unspecified open wound of unspecified upper arm, initial encounter: Secondary | ICD-10-CM | POA: Diagnosis present

## 2013-04-16 DIAGNOSIS — R55 Syncope and collapse: Secondary | ICD-10-CM | POA: Diagnosis present

## 2013-04-16 DIAGNOSIS — S0560XA Penetrating wound without foreign body of unspecified eyeball, initial encounter: Secondary | ICD-10-CM | POA: Diagnosis present

## 2013-04-16 DIAGNOSIS — W19XXXA Unspecified fall, initial encounter: Secondary | ICD-10-CM | POA: Diagnosis present

## 2013-04-16 DIAGNOSIS — Z9089 Acquired absence of other organs: Secondary | ICD-10-CM

## 2013-04-16 DIAGNOSIS — Z8673 Personal history of transient ischemic attack (TIA), and cerebral infarction without residual deficits: Secondary | ICD-10-CM

## 2013-04-16 DIAGNOSIS — K219 Gastro-esophageal reflux disease without esophagitis: Secondary | ICD-10-CM | POA: Diagnosis present

## 2013-04-16 DIAGNOSIS — Z87891 Personal history of nicotine dependence: Secondary | ICD-10-CM

## 2013-04-16 DIAGNOSIS — S066XAA Traumatic subarachnoid hemorrhage with loss of consciousness status unknown, initial encounter: Principal | ICD-10-CM | POA: Diagnosis present

## 2013-04-16 DIAGNOSIS — I252 Old myocardial infarction: Secondary | ICD-10-CM

## 2013-04-16 LAB — CBC WITH DIFFERENTIAL/PLATELET
Eosinophils Absolute: 0 10*3/uL (ref 0.0–0.7)
Hemoglobin: 14.2 g/dL (ref 13.0–17.0)
Lymphocytes Relative: 7 % — ABNORMAL LOW (ref 12–46)
Lymphs Abs: 0.8 10*3/uL (ref 0.7–4.0)
MCH: 32.1 pg (ref 26.0–34.0)
Monocytes Relative: 5 % (ref 3–12)
Neutrophils Relative %: 88 % — ABNORMAL HIGH (ref 43–77)
RBC: 4.42 MIL/uL (ref 4.22–5.81)
WBC: 10.7 10*3/uL — ABNORMAL HIGH (ref 4.0–10.5)

## 2013-04-16 LAB — COMPREHENSIVE METABOLIC PANEL
ALT: 15 U/L (ref 0–53)
Alkaline Phosphatase: 68 U/L (ref 39–117)
BUN: 29 mg/dL — ABNORMAL HIGH (ref 6–23)
CO2: 24 mEq/L (ref 19–32)
Chloride: 102 mEq/L (ref 96–112)
GFR calc Af Amer: 68 mL/min — ABNORMAL LOW (ref >90)
GFR calc non Af Amer: 58 mL/min — ABNORMAL LOW (ref >90)
Glucose, Bld: 146 mg/dL — ABNORMAL HIGH (ref 70–99)
Potassium: 4.4 mEq/L (ref 3.5–5.1)
Total Bilirubin: 0.3 mg/dL (ref 0.3–1.2)
Total Protein: 7.2 g/dL (ref 6.0–8.3)

## 2013-04-16 LAB — PROTIME-INR: Prothrombin Time: 11.9 seconds (ref 11.6–15.2)

## 2013-04-16 MED ORDER — PROPOFOL 10 MG/ML IV BOLUS
INTRAVENOUS | Status: AC | PRN
  Administered 2013-04-16: 50 mg via INTRAVENOUS

## 2013-04-16 MED ORDER — LIDOCAINE-EPINEPHRINE-TETRACAINE (LET) SOLUTION
3.0000 mL | Freq: Once | NASAL | Status: AC
  Administered 2013-04-16: 3 mL via TOPICAL
  Filled 2013-04-16: qty 3

## 2013-04-16 MED ORDER — PROPOFOL 10 MG/ML IV BOLUS
1.0000 mg/kg | Freq: Once | INTRAVENOUS | Status: AC
  Administered 2013-04-16: 200 mg via INTRAVENOUS
  Filled 2013-04-16: qty 1

## 2013-04-16 NOTE — ED Notes (Signed)
[id number]<[id number]> Expected date:<BR> Expected time:<BR> Means of arrival:<BR> Comments:<BR> ems- 79 yo M, fell, dislocated shoulder

## 2013-04-16 NOTE — H&P (Signed)
Chief Complaint:  Observed to fall at home in the yard  History of Present Illness:  Eric Cooley is an 79 y.o. male who was brought to the [facility] emergency room after falling in a sure. He was coherent and tripped and fell. He has some amnesia to the events following the fall. In the fall he sustained a dislocation of his right arm and hit his head sustaining a laceration above his right. There was a brief loss of consciousness.  The patient has been otherwise healthy for his age and reasonably active. He is followed by Dr. Creola Corn. He had a heart attack approximately 4 years ago requiring a single stent placement.  Past Medical History  Diagnosis Date  . Hypertension   . MI (myocardial infarction)   . 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 CAD (coronary artery disease) 03/29/2011  . HTN (hypertension) 03/29/2011  . Hyperlipidemia 03/29/2011  . BPH (benign prostatic hypertrophy) 03/29/2011  . GERD (gastroesophageal reflux disease) 03/29/2011  . S/P cholecystectomy 03/29/2011  . 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 Thrombocytopenia 03/29/2011  . Dizziness - giddy 03/29/2011  . Allergic rhinitis, cause unspecified 03/29/2011  . S/P partial gastrectomy 03/29/2011  . Anemia 03/29/2011  . Arthritis   . Ulcer   . Urine incontinence   . Low back pain   . DJD (degenerative joint disease), lumbar 04/01/2011  . Prostate cancer 03/29/2011    Past Surgical History  Procedure Laterality Date  . Heart stent  2010  . Tonsillectomy  1951  . Cholecystectomy  1986  . Stromal tumor      No current facility-administered medications for this encounter.   Current Outpatient Prescriptions  Medication Sig Dispense Refill  . acetaminophen (TYLENOL) 500 MG tablet Take 500 mg by mouth every 6 (six) hours as needed for pain.      Marland Kitchen aspirin 81 MG EC tablet Take 81 mg by mouth daily.        Marland Kitchen lisinopril (PRINIVIL,ZESTRIL) 5 MG tablet Take 5 mg by mouth daily.       . Multiple Vitamins-Minerals (ICAPS) CAPS Take 1 capsule by mouth daily.       . naproxen sodium (ANAPROX) 220 MG tablet Take 220 mg by mouth 2 (two) times daily with a meal.      . NITROSTAT 0.4 MG SL tablet place 1 tablet under the tongue every 5 minutes for 3 doses if needed for chest pain  25 tablet  2  . pantoprazole (PROTONIX) 20 MG tablet Take 20 mg by mouth daily.      . simvastatin (ZOCOR) 10 MG tablet Take 10 mg by mouth daily.        . Tamsulosin HCl (FLOMAX) 0.4 MG CAPS Take by mouth daily.        . polyethylene glycol (MIRALAX / GLYCOLAX) packet Take 17 g by mouth daily.      . [DISCONTINUED] fluticasone (FLONASE) 50 MCG/ACT nasal spray Place 2 sprays into the nose daily.        . [DISCONTINUED] omeprazole (PRILOSEC) 20 MG capsule Take 20 mg by mouth 2 (two) times daily.         Review of patient's allergies indicates no known allergies. Family History  Problem Relation Age of Onset  . Arthritis Mother   . Cancer Mother     ovarian cancer  . Ovarian cancer Mother   . Arthritis Father   . Cancer Other     Lung  cancer  . Colon cancer Neg Hx    Social History:   reports that he has quit smoking. He has never used smokeless tobacco. He reports that he drinks about 0.6 ounces of alcohol per week. He reports that he does not use illicit drugs.   REVIEW OF SYSTEMS - PERTINENT POSITIVES ONLY: noncontributory  Physical Exam:   Blood pressure 135/66, pulse 57, temperature 97.6 F (36.4 C), temperature source Oral, resp. rate 18, weight 140 lb 6.9 oz (63.7 kg), SpO2 100.00%. Body mass index is 21.36 kg/(m^2).  Gen:  WDWN white male NAD  Neurological: Alert and oriented to person, place, and time. Motor and sensory function is grossly intact  Head: small laceration above the right eye.  Eyes: Conjunctivae are normal. Pupils are equal, round, small and reactive to light. No scleral icterus.  Neck: collar in place  Cardiovascular:  SR without murmurs or gallops.    Respiratory: Effort normal.  No respiratory distress. No chest wall tenderness. Breath sounds normal.  No wheezes, rales or rhonchi.  Abdomen:  nontender GU: Musculoskeletal: Normal range of motion. Extremities are nontender. No cyanosis, edema or clubbing noted Lymphadenopathy: No cervical, preauricular, postauricular or axillary adenopathy is present Skin: Skin is warm and dry. No rash noted. No diaphoresis. No erythema. No pallor. Pscyh: Normal mood and affect. Behavior is normal. Judgment and thought content normal.   LABORATORY RESULTS: Results for orders placed during the hospital encounter of 04/16/13 (from the past 48 hour(s))  CBC WITH DIFFERENTIAL     Status: Abnormal   Collection Time    04/16/13  5:03 PM      Result Value Range   WBC 10.7 (*) 4.0 - 10.5 K/uL   RBC 4.42  4.22 - 5.81 MIL/uL   Hemoglobin 14.2  13.0 - 17.0 g/dL   HCT 91.4  78.2 - 95.6 %   MCV 97.7  78.0 - 100.0 fL   MCH 32.1  26.0 - 34.0 pg   MCHC 32.9  30.0 - 36.0 g/dL   RDW 21.3  08.6 - 57.8 %   Platelets 174  150 - 400 K/uL   Neutrophils Relative % 88 (*) 43 - 77 %   Neutro Abs 9.4 (*) 1.7 - 7.7 K/uL   Lymphocytes Relative 7 (*) 12 - 46 %   Lymphs Abs 0.8  0.7 - 4.0 K/uL   Monocytes Relative 5  3 - 12 %   Monocytes Absolute 0.5  0.1 - 1.0 K/uL   Eosinophils Relative 0  0 - 5 %   Eosinophils Absolute 0.0  0.0 - 0.7 K/uL   Basophils Relative 0  0 - 1 %   Basophils Absolute 0.0  0.0 - 0.1 K/uL  COMPREHENSIVE METABOLIC PANEL     Status: Abnormal   Collection Time    04/16/13  5:03 PM      Result Value Range   Sodium 138  135 - 145 mEq/L   Potassium 4.4  3.5 - 5.1 mEq/L   Chloride 102  96 - 112 mEq/L   CO2 24  19 - 32 mEq/L   Glucose, Bld 146 (*) 70 - 99 mg/dL   BUN 29 (*) 6 - 23 mg/dL   Creatinine, Ser 4.69  0.50 - 1.35 mg/dL   Calcium 9.8  8.4 - 62.9 mg/dL   Total Protein 7.2  6.0 - 8.3 g/dL   Albumin 3.7  3.5 - 5.2 g/dL   AST 24  0 - 37 U/L   ALT 15  0 - 53 U/L   Alkaline Phosphatase 68  39  - 117 U/L   Total Bilirubin 0.3  0.3 - 1.2 mg/dL   GFR calc non Af Amer 58 (*) >90 mL/min   GFR calc Af Amer 68 (*) >90 mL/min   Comment:            The eGFR has been calculated     using the CKD EPI equation.     This calculation has not been     validated in all clinical     situations.     eGFR's persistently     <90 mL/min signify     possible Chronic Kidney Disease.  PROTIME-INR     Status: None   Collection Time    04/16/13  5:03 PM      Result Value Range   Prothrombin Time 11.9  11.6 - 15.2 seconds   INR 0.89  0.00 - 1.49    RADIOLOGY RESULTS: Dg Shoulder Right  04/16/2013   *RADIOLOGY REPORT*  Clinical Data: Post reduction  RIGHT SHOULDER - 2+ VIEW  Comparison: 04/16/2013  Findings: Inferior shoulder dislocation has been reduced. Impaction fracture of the articular surface of the humeral head cannot be excluded with a cortical defect noted.  Suboptimal projections are available.  There is degenerative change in the [facility] joint.  IMPRESSION: Right shoulder reduction.  Possible impaction fracture of the humeral head.   Original Report Authenticated By: Janeece Riggers, M.D.   Ct Head Wo Contrast  04/16/2013   *RADIOLOGY REPORT*  Clinical Data:  Larey Seat.  CT HEAD WITHOUT CONTRAST CT CERVICAL SPINE WITHOUT CONTRAST  Technique:  Multidetector CT imaging of the head and cervical spine was performed following the standard protocol without intravenous contrast.  Multiplanar CT image reconstructions of the cervical spine were also generated.  Comparison:  Previous examinations.  CT HEAD  Findings: No significant change in diffuse enlargement of the ventricles and subarachnoid spaces and patchy white matter low density in both cerebral hemispheres.  Interval small amount of focal hemorrhage deep in the left central sulcus or adjacent cortex.  No other intracranial hemorrhage.  No skull fractures or paranasal sinus air-fluid levels.  IMPRESSION:  1.  Small amount of focal subarachnoid hemorrhage in the  deep portion of the left central sulcus versus focal cortical hemorrhage at that location. 2.  Stable atrophy and chronic small vessel white matter ischemic changes.  CT CERVICAL SPINE  Findings: Multilevel degenerative changes.  These include extensive facet degenerative changes of multiple levels with minimal anterolisthesis at the C2-3 level and minimal retrolisthesis at the C3-4, C4-5 and C5-6 levels.  No fractures or prevertebral soft tissue swelling.  IMPRESSION:  1.  No fracture or traumatic subluxation. 2.  Extensive degenerative changes with associated minimal subluxations, as described above.   Original Report Authenticated By: Beckie Salts, M.D.   Ct Cervical Spine Wo Contrast  04/16/2013   *RADIOLOGY REPORT*  Clinical Data:  Larey Seat.  CT HEAD WITHOUT CONTRAST CT CERVICAL SPINE WITHOUT CONTRAST  Technique:  Multidetector CT imaging of the head and cervical spine was performed following the standard protocol without intravenous contrast.  Multiplanar CT image reconstructions of the cervical spine were also generated.  Comparison:  Previous examinations.  CT HEAD  Findings: No significant change in diffuse enlargement of the ventricles and subarachnoid spaces and patchy white matter low density in both cerebral hemispheres.  Interval small amount of focal hemorrhage deep in the left central sulcus or adjacent cortex.  No  other intracranial hemorrhage.  No skull fractures or paranasal sinus air-fluid levels.  IMPRESSION:  1.  Small amount of focal subarachnoid hemorrhage in the deep portion of the left central sulcus versus focal cortical hemorrhage at that location. 2.  Stable atrophy and chronic small vessel white matter ischemic changes.  CT CERVICAL SPINE  Findings: Multilevel degenerative changes.  These include extensive facet degenerative changes of multiple levels with minimal anterolisthesis at the C2-3 level and minimal retrolisthesis at the C3-4, C4-5 and C5-6 levels.  No fractures or prevertebral  soft tissue swelling.  IMPRESSION:  1.  No fracture or traumatic subluxation. 2.  Extensive degenerative changes with associated minimal subluxations, as described above.   Original Report Authenticated By: Beckie Salts, M.D.   Dg Shoulder Right Port  04/16/2013   *RADIOLOGY REPORT*  Clinical Data: Status post fall.  Unable to move the right arm. Right arm pain.  PORTABLE RIGHT SHOULDER - 2+ VIEW  Comparison: None.  Findings: The right arm is in extreme abduction consistent with inferior shoulder dislocation or luxatio erecta.  No fracture is identified.  The acromioclavicular joint is intact.  Imaged lung parenchyma is clear.  IMPRESSION: Inferior shoulder dislocation or luxatio erecta.   Original Report Authenticated By: Holley Dexter, M.D.    Problem List: Patient Active Problem List   Diagnosis Date Noted  . Subarachnoid hemorrhage following injury 04/16/2013  . Shoulder dislocation-right 04/16/2013  . Intermittent low back pain 04/15/2012  . Constipation 03/04/2012  . Dysuria 03/04/2012  . Pneumonia 01/29/2012  . Dizziness 12/18/2011  . Balance disorder 12/18/2011  . Pre-ulcerative corn or callous 10/13/2011  . DJD (degenerative joint disease), lumbar 04/01/2011  . CAD (coronary artery disease) 03/29/2011  . Prostate cancer 03/29/2011  . 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 HTN (hypertension) 03/29/2011  . Hyperlipidemia 03/29/2011  . BPH (benign prostatic hypertrophy) 03/29/2011  . GERD (gastroesophageal reflux disease) 03/29/2011  . Preventative health care 03/29/2011  . S/P cholecystectomy 03/29/2011  . Allergic rhinitis, cause unspecified 03/29/2011  . S/P partial gastrectomy 03/29/2011  . Anemia 03/29/2011    Assessment & Plan: 79 year old man who fell sustaining a small subarachnoid hemorrhage and a dislocation of his right shoulder that has been reduced in the emergency department [facility]. We'll transfer patient to [facility] to [phone] for observation for followup  by Dr. Derrell Lolling and Dr. Newell Coral.      Matt B. Daphine Deutscher, MD, [facility] [phone] beeper [phone]  04/16/2013 10:05 PM

## 2013-04-16 NOTE — ED Notes (Addendum)
Per ems pt is from home. Witness falled, walking in grass next to driveway. Pt tripped and fell. C/o dislocated right shoulder. No LOC. Alert and oriented x4, slightly groggy once given fentanyl. Skin tear on right arm, laceration above right eye. Fully immobilized, arm imobolized with arm above head, found in this position.
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
## 2013-04-16 NOTE — ED Notes (Signed)
Dr. Martin at bedside

## 2013-04-17 ENCOUNTER — Encounter (HOSPITAL_COMMUNITY): Payer: Self-pay | Admitting: *Deleted

## 2013-04-17 ENCOUNTER — Observation Stay (HOSPITAL_COMMUNITY): Payer: Medicare Other

## 2013-04-17 MED ORDER — PANTOPRAZOLE SODIUM 20 MG PO TBEC
20.0000 mg | DELAYED_RELEASE_TABLET | Freq: Every day | ORAL | Status: DC
  Administered 2013-04-17 – 2013-04-20 (×4): 20 mg via ORAL
  Filled 2013-04-17 (×4): qty 1

## 2013-04-17 MED ORDER — HYDROCODONE-ACETAMINOPHEN 5-325 MG PO TABS
1.0000 | ORAL_TABLET | ORAL | Status: DC | PRN
  Administered 2013-04-17 (×2): 1 via ORAL
  Administered 2013-04-18: 2 via ORAL
  Administered 2013-04-18 – 2013-04-20 (×6): 1 via ORAL
  Filled 2013-04-17: qty 2
  Filled 2013-04-17 (×6): qty 1
  Filled 2013-04-17: qty 2
  Filled 2013-04-17: qty 1

## 2013-04-17 MED ORDER — TAMSULOSIN HCL 0.4 MG PO CAPS
0.4000 mg | ORAL_CAPSULE | Freq: Every day | ORAL | Status: DC
  Administered 2013-04-17 – 2013-04-20 (×4): 0.4 mg via ORAL
  Filled 2013-04-17 (×4): qty 1

## 2013-04-17 MED ORDER — LISINOPRIL 5 MG PO TABS
5.0000 mg | ORAL_TABLET | Freq: Every day | ORAL | Status: DC
  Administered 2013-04-17 – 2013-04-20 (×4): 5 mg via ORAL
  Filled 2013-04-17 (×4): qty 1

## 2013-04-17 MED ORDER — MORPHINE SULFATE 2 MG/ML IJ SOLN
1.0000 mg | INTRAMUSCULAR | Status: DC | PRN
  Administered 2013-04-17 (×3): 1 mg via INTRAVENOUS
  Filled 2013-04-17 (×2): qty 1

## 2013-04-17 MED ORDER — ONDANSETRON HCL 4 MG PO TABS: 4.0000 mg | ORAL_TABLET | Freq: Four times a day (QID) | ORAL | Status: DC | PRN

## 2013-04-17 MED ORDER — BACITRACIN-NEOMYCIN-POLYMYXIN OINTMENT TUBE
TOPICAL_OINTMENT | Freq: Every day | CUTANEOUS | Status: DC
  Administered 2013-04-17 – 2013-04-20 (×4): via TOPICAL
  Filled 2013-04-17 (×2): qty 15

## 2013-04-17 MED ORDER — ONDANSETRON HCL 4 MG/2ML IJ SOLN: 4.0000 mg | Freq: Four times a day (QID) | INTRAMUSCULAR | Status: DC | PRN

## 2013-04-17 MED ORDER — POLYETHYLENE GLYCOL 3350 17 G PO PACK
17.0000 g | PACK | Freq: Every day | ORAL | Status: DC | PRN
  Filled 2013-04-17: qty 1

## 2013-04-17 MED ORDER — NITROGLYCERIN 0.3 MG SL SUBL
0.3000 mg | SUBLINGUAL_TABLET | SUBLINGUAL | Status: DC | PRN
  Filled 2013-04-17: qty 100

## 2013-04-17 MED ORDER — DOCUSATE SODIUM 100 MG PO CAPS
100.0000 mg | ORAL_CAPSULE | Freq: Two times a day (BID) | ORAL | Status: DC
  Administered 2013-04-17 – 2013-04-20 (×6): 100 mg via ORAL
  Filled 2013-04-17 (×8): qty 1

## 2013-04-17 MED ORDER — NITROGLYCERIN 0.4 MG SL SUBL: 0.4000 mg | SUBLINGUAL_TABLET | SUBLINGUAL | Status: DC | PRN

## 2013-04-17 MED ORDER — KCL IN DEXTROSE-NACL 20-5-0.45 MEQ/L-%-% IV SOLN
INTRAVENOUS | Status: DC
  Administered 2013-04-17: 1000 mL via INTRAVENOUS
  Administered 2013-04-17: 16:00:00 via INTRAVENOUS
  Administered 2013-04-18: 1000 mL via INTRAVENOUS
  Filled 2013-04-17 (×11): qty 1000

## 2013-04-17 NOTE — Progress Notes (Signed)
Patient ID: Eric Cooley, male   DOB: Oct 03, 1920, 79 y.o.   MRN: [id number]    Subjective: Pt feels well today.  Pain is better after morphine.  Right shoulder in sling.  Wants more to eat than clears.  Denies diplopia, HA, dizziness, weakness.  Jaw pain with collar on, but improved now that it's off  Objective: Vital signs in last 24 hours: Temp:  [97.6 F (36.4 C)-98 F (36.7 C)] 97.8 F (36.6 C) (07/12 0800) Pulse Rate:  [51-98] 53 (07/12 0900) Resp:  [12-23] 13 (07/12 0900) BP: (122-171)/(42-105) 122/42 mmHg (07/12 0900) SpO2:  [2 %-100 %] 97 % (07/12 0900) Weight:  [140 lb 6.9 oz (63.7 kg)] 140 lb 6.9 oz (63.7 kg) (07/11 1805) Last BM Date: 04/16/13  Intake/Output from previous day: 07/11 0701 - 07/12 0700 In: 450 [I.V.:450] Out: 1150 [Urine:1150] Intake/Output this shift: Total I/O In: 150 [I.V.:150] Out: 375 [Urine:375]  PE: General: Pleasant NAD, appropriate HEENT: atraumatic, small right eye laceration with sutures in place, small ecchymosis, right ear TM is intact with no clear or bloody drainage.  Small amount of cerumen.  No pain with jaw motion of biting down Ht: regular Lungs: CTAB Neuro: NVI Ext: right shoulder in sling  Lab Results:   Recent Labs  04/16/13 1703  WBC 10.7*  HGB 14.2  HCT 43.2  PLT 174   BMET  Recent Labs  04/16/13 1703  NA 138  K 4.4  CL 102  CO2 24  GLUCOSE 146*  BUN 29*  CREATININE 1.08  CALCIUM 9.8   PT/INR  Recent Labs  04/16/13 1703  LABPROT 11.9  INR 0.89   CMP     Component Value Date/Time   NA 138 04/16/2013 1703   K 4.4 04/16/2013 1703   CL 102 04/16/2013 1703   CO2 24 04/16/2013 1703   GLUCOSE 146* 04/16/2013 1703   BUN 29* 04/16/2013 1703   CREATININE 1.08 04/16/2013 1703   CALCIUM 9.8 04/16/2013 1703   PROT 7.2 04/16/2013 1703   ALBUMIN 3.7 04/16/2013 1703   AST 24 04/16/2013 1703   ALT 15 04/16/2013 1703   ALKPHOS 68 04/16/2013 1703   BILITOT 0.3 04/16/2013 1703   GFRNONAA 58* 04/16/2013 1703   GFRAA  68* 04/16/2013 1703   Lipase  No results found for this basename: lipase       Studies/Results: Dg Shoulder Right  04/16/2013   *RADIOLOGY REPORT*  Clinical Data: Post reduction  RIGHT SHOULDER - 2+ VIEW  Comparison: 04/16/2013  Findings: Inferior shoulder dislocation has been reduced. Impaction fracture of the articular surface of the humeral head cannot be excluded with a cortical defect noted.  Suboptimal projections are available.  There is degenerative change in the [facility] joint.  IMPRESSION: Right shoulder reduction.  Possible impaction fracture of the humeral head.   Original Report Authenticated By: Janeece Riggers, M.D.   Ct Head Wo Contrast  04/16/2013   *RADIOLOGY REPORT*  Clinical Data:  Larey Seat.  CT HEAD WITHOUT CONTRAST CT CERVICAL SPINE WITHOUT CONTRAST  Technique:  Multidetector CT imaging of the head and cervical spine was performed following the standard protocol without intravenous contrast.  Multiplanar CT image reconstructions of the cervical spine were also generated.  Comparison:  Previous examinations.  CT HEAD  Findings: No significant change in diffuse enlargement of the ventricles and subarachnoid spaces and patchy white matter low density in both cerebral hemispheres.  Interval small amount of focal hemorrhage deep in the left central sulcus or adjacent cortex.  No other intracranial hemorrhage.  No skull fractures or paranasal sinus air-fluid levels.  IMPRESSION:  1.  Small amount of focal subarachnoid hemorrhage in the deep portion of the left central sulcus versus focal cortical hemorrhage at that location. 2.  Stable atrophy and chronic small vessel white matter ischemic changes.  CT CERVICAL SPINE  Findings: Multilevel degenerative changes.  These include extensive facet degenerative changes of multiple levels with minimal anterolisthesis at the C2-3 level and minimal retrolisthesis at the C3-4, C4-5 and C5-6 levels.  No fractures or prevertebral soft tissue swelling.  IMPRESSION:   1.  No fracture or traumatic subluxation. 2.  Extensive degenerative changes with associated minimal subluxations, as described above.   Original Report Authenticated By: Beckie Salts, M.D.   Ct Cervical Spine Wo Contrast  04/16/2013   *RADIOLOGY REPORT*  Clinical Data:  Larey Seat.  CT HEAD WITHOUT CONTRAST CT CERVICAL SPINE WITHOUT CONTRAST  Technique:  Multidetector CT imaging of the head and cervical spine was performed following the standard protocol without intravenous contrast.  Multiplanar CT image reconstructions of the cervical spine were also generated.  Comparison:  Previous examinations.  CT HEAD  Findings: No significant change in diffuse enlargement of the ventricles and subarachnoid spaces and patchy white matter low density in both cerebral hemispheres.  Interval small amount of focal hemorrhage deep in the left central sulcus or adjacent cortex.  No other intracranial hemorrhage.  No skull fractures or paranasal sinus air-fluid levels.  IMPRESSION:  1.  Small amount of focal subarachnoid hemorrhage in the deep portion of the left central sulcus versus focal cortical hemorrhage at that location. 2.  Stable atrophy and chronic small vessel white matter ischemic changes.  CT CERVICAL SPINE  Findings: Multilevel degenerative changes.  These include extensive facet degenerative changes of multiple levels with minimal anterolisthesis at the C2-3 level and minimal retrolisthesis at the C3-4, C4-5 and C5-6 levels.  No fractures or prevertebral soft tissue swelling.  IMPRESSION:  1.  No fracture or traumatic subluxation. 2.  Extensive degenerative changes with associated minimal subluxations, as described above.   Original Report Authenticated By: Beckie Salts, M.D.   Dg Shoulder Right Port  04/16/2013   *RADIOLOGY REPORT*  Clinical Data: Status post fall.  Unable to move the right arm. Right arm pain.  PORTABLE RIGHT SHOULDER - 2+ VIEW  Comparison: None.  Findings: The right arm is in extreme abduction  consistent with inferior shoulder dislocation or luxatio erecta.  No fracture is identified.  The acromioclavicular joint is intact.  Imaged lung parenchyma is clear.  IMPRESSION: Inferior shoulder dislocation or luxatio erecta.   Original Report Authenticated By: Holley Dexter, M.D.    Anti-infectives: Anti-infectives   None       Assessment/Plan  1. Fall 2. Right should dislocation, s/p reduction, ? Impaction fracture 3. Small SAH 4. Small right eye laceration Patient Active Problem List   Diagnosis Date Noted  . Subarachnoid hemorrhage following injury 04/16/2013  . Shoulder dislocation-right 04/16/2013  . Intermittent low back pain 04/15/2012  . Constipation 03/04/2012  . Dysuria 03/04/2012  . Pneumonia 01/29/2012  . Dizziness 12/18/2011  . Balance disorder 12/18/2011  . Pre-ulcerative corn or callous 10/13/2011  . DJD (degenerative joint disease), lumbar 04/01/2011  . CAD (coronary artery disease) 03/29/2011  . Prostate cancer 03/29/2011  . 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 HTN (hypertension) 03/29/2011  . Hyperlipidemia 03/29/2011  . BPH (benign prostatic hypertrophy) 03/29/2011  . GERD (gastroesophageal reflux disease) 03/29/2011  . Preventative  health care 03/29/2011  . S/P cholecystectomy 03/29/2011  . Allergic rhinitis, cause unspecified 03/29/2011  . S/P partial gastrectomy 03/29/2011  . Anemia 03/29/2011   Plan: 1. Will repeat a head CT today to make sure SAH is stable.  If so, will have PT/OT see patient for mobilization.  If this is ok as well, he can move to the floor. 2. Spoke to Dr. Rennis Chris about shoulder.  He will review his films.  If all is ok, then the patient will follow up with him in clinic in 7-10 days.  He should remain in his sling. 3. Advance diet 4. Resume home meds 5. Bacitracin for eye lac.   LOS: 1 day    Kash Davie E 04/17/2013, 10:14 AM Pager: [phone]

## 2013-04-17 NOTE — Progress Notes (Signed)
UR Completed.  Carianna Lague Jane [phone] 04/17/2013  

## 2013-04-17 NOTE — Consult Note (Addendum)
Reason for Consult: Closed head injury traumatic subarachnoid hemorrhage Referring Physician: ER trauma   Eric Cooley is an 79 y.o. male.  HPI: Patient is a 80 year old some is as on Thursday had a syncopal episode was working outside and heat fell back structures head he was out for an unknown period time went to the emergency room in last night for evaluation of headaches or experiencing some jaw pain worse on the right it's progressed over to include the left jaw has a mild headache otherwise no neck pain denies any numbness tingling his arms or his legs I does have some pain in the right upper extremity around the collarbone has pain on movement of right upper extremity. Denies any nausea vomiting upon presentation to the ER underwent a CAT scan of his head showed a small amount of maximum hemorrhage patient was transferred down to the trauma service and have been called to consult.  Past Medical History  Diagnosis Date  . Hypertension   . MI (myocardial infarction)   . 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 CAD (coronary artery disease) 03/29/2011  . HTN (hypertension) 03/29/2011  . Hyperlipidemia 03/29/2011  . BPH (benign prostatic hypertrophy) 03/29/2011  . GERD (gastroesophageal reflux disease) 03/29/2011  . S/P cholecystectomy 03/29/2011  . 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 Thrombocytopenia 03/29/2011  . Dizziness - giddy 03/29/2011  . Allergic rhinitis, cause unspecified 03/29/2011  . S/P partial gastrectomy 03/29/2011  . Anemia 03/29/2011  . Arthritis   . Ulcer   . Urine incontinence   . Low back pain   . DJD (degenerative joint disease), lumbar 04/01/2011  . Prostate cancer 03/29/2011    Past Surgical History  Procedure Laterality Date  . Heart stent  2010  . Tonsillectomy  1951  . Cholecystectomy  1986  . Stromal tumor      Family History  Problem Relation Age of Onset  . Arthritis Mother   . Cancer Mother     ovarian  cancer  . Ovarian cancer Mother   . Arthritis Father   . Cancer Other     Lung cancer  . Colon cancer Neg Hx     Social History:  reports that he has quit smoking. He has never used smokeless tobacco. He reports that he drinks about 0.6 ounces of alcohol per week. He reports that he does not use illicit drugs.  Allergies: No Known Allergies  Medications: I have reviewed the patient's current medications.  Results for orders placed during the hospital encounter of 04/16/13 (from the past 48 hour(s))  CBC WITH DIFFERENTIAL     Status: Abnormal   Collection Time    04/16/13  5:03 PM      Result Value Range   WBC 10.7 (*) 4.0 - 10.5 K/uL   RBC 4.42  4.22 - 5.81 MIL/uL   Hemoglobin 14.2  13.0 - 17.0 g/dL   HCT 21.3  08.6 - 57.8 %   MCV 97.7  78.0 - 100.0 fL   MCH 32.1  26.0 - 34.0 pg   MCHC 32.9  30.0 - 36.0 g/dL   RDW 46.9  62.9 - 52.8 %   Platelets 174  150 - 400 K/uL   Neutrophils Relative % 88 (*) 43 - 77 %   Neutro Abs 9.4 (*) 1.7 - 7.7 K/uL   Lymphocytes Relative 7 (*) 12 - 46 %   Lymphs Abs 0.8  0.7 - 4.0 K/uL   Monocytes Relative 5  3 - 12 %   Monocytes Absolute 0.5  0.1 - 1.0 K/uL   Eosinophils Relative 0  0 - 5 %   Eosinophils Absolute 0.0  0.0 - 0.7 K/uL   Basophils Relative 0  0 - 1 %   Basophils Absolute 0.0  0.0 - 0.1 K/uL  COMPREHENSIVE METABOLIC PANEL     Status: Abnormal   Collection Time    04/16/13  5:03 PM      Result Value Range   Sodium 138  135 - 145 mEq/L   Potassium 4.4  3.5 - 5.1 mEq/L   Chloride 102  96 - 112 mEq/L   CO2 24  19 - 32 mEq/L   Glucose, Bld 146 (*) 70 - 99 mg/dL   BUN 29 (*) 6 - 23 mg/dL   Creatinine, Ser 4.09  0.50 - 1.35 mg/dL   Calcium 9.8  8.4 - 81.1 mg/dL   Total Protein 7.2  6.0 - 8.3 g/dL   Albumin 3.7  3.5 - 5.2 g/dL   AST 24  0 - 37 U/L   ALT 15  0 - 53 U/L   Alkaline Phosphatase 68  39 - 117 U/L   Total Bilirubin 0.3  0.3 - 1.2 mg/dL   GFR calc non Af Amer 58 (*) >90 mL/min   GFR calc Af Amer 68 (*) >90 mL/min    Comment:            The eGFR has been calculated     using the CKD EPI equation.     This calculation has not been     validated in all clinical     situations.     eGFR's persistently     <90 mL/min signify     possible Chronic Kidney Disease.  PROTIME-INR     Status: None   Collection Time    04/16/13  5:03 PM      Result Value Range   Prothrombin Time 11.9  11.6 - 15.2 seconds   INR 0.89  0.00 - 1.49  MRSA PCR SCREENING     Status: None   Collection Time    04/17/13  1:01 AM      Result Value Range   MRSA by PCR NEGATIVE  NEGATIVE   Comment:            The GeneXpert MRSA Assay (FDA     approved for NASAL specimens     only), is one component of a     comprehensive MRSA colonization     surveillance program. It is not     intended to diagnose MRSA     infection nor to guide or     monitor treatment for     MRSA infections.    Dg Shoulder Right  04/16/2013   *RADIOLOGY REPORT*  Clinical Data: Post reduction  RIGHT SHOULDER - 2+ VIEW  Comparison: 04/16/2013  Findings: Inferior shoulder dislocation has been reduced. Impaction fracture of the articular surface of the humeral head cannot be excluded with a cortical defect noted.  Suboptimal projections are available.  There is degenerative change in the [facility] joint.  IMPRESSION: Right shoulder reduction.  Possible impaction fracture of the humeral head.   Original Report Authenticated By: Janeece Riggers, M.D.   Ct Head Wo Contrast  04/16/2013   *RADIOLOGY REPORT*  Clinical Data:  Larey Seat.  CT HEAD WITHOUT CONTRAST CT CERVICAL SPINE WITHOUT CONTRAST  Technique:  Multidetector CT imaging of the head and cervical spine was  performed following the standard protocol without intravenous contrast.  Multiplanar CT image reconstructions of the cervical spine were also generated.  Comparison:  Previous examinations.  CT HEAD  Findings: No significant change in diffuse enlargement of the ventricles and subarachnoid spaces and patchy white matter low  density in both cerebral hemispheres.  Interval small amount of focal hemorrhage deep in the left central sulcus or adjacent cortex.  No other intracranial hemorrhage.  No skull fractures or paranasal sinus air-fluid levels.  IMPRESSION:  1.  Small amount of focal subarachnoid hemorrhage in the deep portion of the left central sulcus versus focal cortical hemorrhage at that location. 2.  Stable atrophy and chronic small vessel white matter ischemic changes.  CT CERVICAL SPINE  Findings: Multilevel degenerative changes.  These include extensive facet degenerative changes of multiple levels with minimal anterolisthesis at the C2-3 level and minimal retrolisthesis at the C3-4, C4-5 and C5-6 levels.  No fractures or prevertebral soft tissue swelling.  IMPRESSION:  1.  No fracture or traumatic subluxation. 2.  Extensive degenerative changes with associated minimal subluxations, as described above.   Original Report Authenticated By: Beckie Salts, M.D.   Ct Cervical Spine Wo Contrast  04/16/2013   *RADIOLOGY REPORT*  Clinical Data:  Larey Seat.  CT HEAD WITHOUT CONTRAST CT CERVICAL SPINE WITHOUT CONTRAST  Technique:  Multidetector CT imaging of the head and cervical spine was performed following the standard protocol without intravenous contrast.  Multiplanar CT image reconstructions of the cervical spine were also generated.  Comparison:  Previous examinations.  CT HEAD  Findings: No significant change in diffuse enlargement of the ventricles and subarachnoid spaces and patchy white matter low density in both cerebral hemispheres.  Interval small amount of focal hemorrhage deep in the left central sulcus or adjacent cortex.  No other intracranial hemorrhage.  No skull fractures or paranasal sinus air-fluid levels.  IMPRESSION:  1.  Small amount of focal subarachnoid hemorrhage in the deep portion of the left central sulcus versus focal cortical hemorrhage at that location. 2.  Stable atrophy and chronic small vessel white  matter ischemic changes.  CT CERVICAL SPINE  Findings: Multilevel degenerative changes.  These include extensive facet degenerative changes of multiple levels with minimal anterolisthesis at the C2-3 level and minimal retrolisthesis at the C3-4, C4-5 and C5-6 levels.  No fractures or prevertebral soft tissue swelling.  IMPRESSION:  1.  No fracture or traumatic subluxation. 2.  Extensive degenerative changes with associated minimal subluxations, as described above.   Original Report Authenticated By: Beckie Salts, M.D.   Dg Shoulder Right Port  04/16/2013   *RADIOLOGY REPORT*  Clinical Data: Status post fall.  Unable to move the right arm. Right arm pain.  PORTABLE RIGHT SHOULDER - 2+ VIEW  Comparison: None.  Findings: The right arm is in extreme abduction consistent with inferior shoulder dislocation or luxatio erecta.  No fracture is identified.  The acromioclavicular joint is intact.  Imaged lung parenchyma is clear.  IMPRESSION: Inferior shoulder dislocation or luxatio erecta.   Original Report Authenticated By: Holley Dexter, M.D.    @ROS @ Blood pressure 144/59, pulse 55, temperature 98 F (36.7 C), temperature source Oral, resp. rate 13, weight 63.7 kg (140 lb 6.9 oz), SpO2 98.00%. Patient is awake alert oriented x4 strength is 5 out of 5 in his upper extremities except with limited by pain in the right upper extremity mostly centered around the shoulder and clavicle. Sensation and reflexes appear to be grossly intact  Assessment/Plan: Closed injury small amount  of left parietal traumatic subarachnoid hemorrhage with minimal to no mass effect this appears to have been a CT scan done 24 hours after the event it's possible he should probably repeat no CT tomorrow however patient neurologically doing very well continued observation I think he can be transferred to the floor be stable enough trauma perspective recommend continued workup with a shoulder as well as potentially of Panorex of his jaw to  evaluate mandibular fracture possible. He had no neck pain so I discontinued cervical collar  Gee Habig P 04/17/2013, 7:52 AM

## 2013-04-17 NOTE — Progress Notes (Signed)
Agree with above 

## 2013-04-18 MED ORDER — BIOTENE DRY MOUTH MT LIQD
15.0000 mL | Freq: Two times a day (BID) | OROMUCOSAL | Status: DC
  Administered 2013-04-18 – 2013-04-20 (×6): 15 mL via OROMUCOSAL

## 2013-04-18 NOTE — Progress Notes (Signed)
I have seen and examined the pt and agree with PA-Osborne's progress note. Ambulate with PT Home today or tomorrow.

## 2013-04-18 NOTE — Progress Notes (Signed)
Patient ID: Eric Cooley, male   DOB: [birth year], 79 y.o.   MRN: [id number]    Subjective: Pt feels well this morning.  No complaints.  Has not gotten out of bed at all yet.  Tolerating a regular diet.  Some shoulder pain, but well-controlled with vicodin  Objective: Vital signs in last 24 hours: Temp:  [97.6 F (36.4 C)-98.6 F (37 C)] 98.4 F (36.9 C) (07/13 0410) Pulse Rate:  [53-136] 59 (07/13 0410) Resp:  [11-25] 18 (07/13 0410) BP: (84-152)/(35-76) 135/54 mmHg (07/13 0410) SpO2:  [86 %-100 %] 100 % (07/13 0410) Last BM Date: 04/16/13  Intake/Output from previous day: 07/12 0701 - 07/13 0700 In: 2200 [I.V.:2200] Out: 1025 [Urine:1025] Intake/Output this shift:    PE: Gen: NAD Ht: regular Lungs: CTAB Ext: right arm in sling  Lab Results:   Recent Labs  04/16/13 1703  WBC 10.7*  HGB 14.2  HCT 43.2  PLT 174   BMET  Recent Labs  04/16/13 1703  NA 138  K 4.4  CL 102  CO2 24  GLUCOSE 146*  BUN 29*  CREATININE 1.08  CALCIUM 9.8   PT/INR  Recent Labs  04/16/13 1703  LABPROT 11.9  INR 0.89   CMP     Component Value Date/Time   NA 138 04/16/2013 1703   K 4.4 04/16/2013 1703   CL 102 04/16/2013 1703   CO2 24 04/16/2013 1703   GLUCOSE 146* 04/16/2013 1703   BUN 29* 04/16/2013 1703   CREATININE 1.08 04/16/2013 1703   CALCIUM 9.8 04/16/2013 1703   PROT 7.2 04/16/2013 1703   ALBUMIN 3.7 04/16/2013 1703   AST 24 04/16/2013 1703   ALT 15 04/16/2013 1703   ALKPHOS 68 04/16/2013 1703   BILITOT 0.3 04/16/2013 1703   GFRNONAA 58* 04/16/2013 1703   GFRAA 68* 04/16/2013 1703   Lipase  No results found for this basename: lipase       Studies/Results: Dg Shoulder Right  04/16/2013   *RADIOLOGY REPORT*  Clinical Data: Post reduction  RIGHT SHOULDER - 2+ VIEW  Comparison: 04/16/2013  Findings: Inferior shoulder dislocation has been reduced. Impaction fracture of the articular surface of the humeral head cannot be excluded with a cortical defect noted.  Suboptimal  projections are available.  There is degenerative change in the [facility] joint.  IMPRESSION: Right shoulder reduction.  Possible impaction fracture of the humeral head.   Original Report Authenticated By: Janeece Riggers, M.D.   Ct Head Wo Contrast  04/17/2013   *RADIOLOGY REPORT*  Clinical Data: Followup subarachnoid hemorrhage.  CT HEAD WITHOUT CONTRAST  Technique:  Contiguous axial images were obtained from the base of the skull through the vertex without contrast.  Comparison: 04/16/2013  Findings: Small amount of subarachnoid blood left frontal region unchanged.  No new intracranial hemorrhage noted.  No skull fracture.  Small vessel disease type changes without CT evidence of large acute infarct.  No intracranial mass lesion detected on this unenhanced exam.  Global atrophy without hydrocephalus.  IMPRESSION: Small amount of subarachnoid blood left frontal region unchanged. No new intracranial hemorrhage noted.   Original Report Authenticated By: Lacy Duverney, M.D.   Ct Head Wo Contrast  04/16/2013   *RADIOLOGY REPORT*  Clinical Data:  Larey Seat.  CT HEAD WITHOUT CONTRAST CT CERVICAL SPINE WITHOUT CONTRAST  Technique:  Multidetector CT imaging of the head and cervical spine was performed following the standard protocol without intravenous contrast.  Multiplanar CT image reconstructions of the cervical spine were also generated.  Comparison:  Previous examinations.  CT HEAD  Findings: No significant change in diffuse enlargement of the ventricles and subarachnoid spaces and patchy white matter low density in both cerebral hemispheres.  Interval small amount of focal hemorrhage deep in the left central sulcus or adjacent cortex.  No other intracranial hemorrhage.  No skull fractures or paranasal sinus air-fluid levels.  IMPRESSION:  1.  Small amount of focal subarachnoid hemorrhage in the deep portion of the left central sulcus versus focal cortical hemorrhage at that location. 2.  Stable atrophy and chronic small vessel  white matter ischemic changes.  CT CERVICAL SPINE  Findings: Multilevel degenerative changes.  These include extensive facet degenerative changes of multiple levels with minimal anterolisthesis at the C2-3 level and minimal retrolisthesis at the C3-4, C4-5 and C5-6 levels.  No fractures or prevertebral soft tissue swelling.  IMPRESSION:  1.  No fracture or traumatic subluxation. 2.  Extensive degenerative changes with associated minimal subluxations, as described above.   Original Report Authenticated By: Beckie Salts, M.D.   Ct Cervical Spine Wo Contrast  04/16/2013   *RADIOLOGY REPORT*  Clinical Data:  Larey Seat.  CT HEAD WITHOUT CONTRAST CT CERVICAL SPINE WITHOUT CONTRAST  Technique:  Multidetector CT imaging of the head and cervical spine was performed following the standard protocol without intravenous contrast.  Multiplanar CT image reconstructions of the cervical spine were also generated.  Comparison:  Previous examinations.  CT HEAD  Findings: No significant change in diffuse enlargement of the ventricles and subarachnoid spaces and patchy white matter low density in both cerebral hemispheres.  Interval small amount of focal hemorrhage deep in the left central sulcus or adjacent cortex.  No other intracranial hemorrhage.  No skull fractures or paranasal sinus air-fluid levels.  IMPRESSION:  1.  Small amount of focal subarachnoid hemorrhage in the deep portion of the left central sulcus versus focal cortical hemorrhage at that location. 2.  Stable atrophy and chronic small vessel white matter ischemic changes.  CT CERVICAL SPINE  Findings: Multilevel degenerative changes.  These include extensive facet degenerative changes of multiple levels with minimal anterolisthesis at the C2-3 level and minimal retrolisthesis at the C3-4, C4-5 and C5-6 levels.  No fractures or prevertebral soft tissue swelling.  IMPRESSION:  1.  No fracture or traumatic subluxation. 2.  Extensive degenerative changes with associated  minimal subluxations, as described above.   Original Report Authenticated By: Beckie Salts, M.D.   Dg Shoulder Right Port  04/16/2013   *RADIOLOGY REPORT*  Clinical Data: Status post fall.  Unable to move the right arm. Right arm pain.  PORTABLE RIGHT SHOULDER - 2+ VIEW  Comparison: None.  Findings: The right arm is in extreme abduction consistent with inferior shoulder dislocation or luxatio erecta.  No fracture is identified.  The acromioclavicular joint is intact.  Imaged lung parenchyma is clear.  IMPRESSION: Inferior shoulder dislocation or luxatio erecta.   Original Report Authenticated By: Holley Dexter, M.D.    Anti-infectives: Anti-infectives   None       Assessment/Plan  1. Fall 2. Right should dislocation with ? Impaction fx 3. SAH, stable 4. Right eye lac  Plan: 1. Prior to dc home patient is going to have to be mobilized.  Awaiting PT/OT consults for recommendations.  Suspect dc home tomorrow. 2. Will need to follow up with trauma clinic this Friday for suture removal 3. Follow up with Dr. Rennis Chris for shoulder in 10 days.   LOS: 2 days    Kerra Guilfoil E 04/18/2013, 10:42 AM  Pager: [phone]

## 2013-04-18 NOTE — Evaluation (Signed)
Physical Therapy Evaluation Patient Details Name: Eric Cooley MRN: [id number] DOB: May 05, 1920 Today's Date: 04/18/2013 Time: 4098-1191 PT Time Calculation (min): 30 min  PT Assessment / Plan / Recommendation History of Present Illness    HPI: Patient is a 79 year old some is as on Thursday had a syncopal episode was working outside and heat fell back structures head he was out for an unknown period time went to the emergency room in last night for evaluation of headaches or experiencing some jaw pain worse on the right it's progressed over to include the left jaw has a mild headache otherwise no neck pain denies any numbness tingling his arms or his legs I does have some pain in the right upper extremity around the collarbone has pain on movement of right upper extremity. Imaging shows right shoulder inferior subluxation and small SAH.   Clinical Impression  Presents with moderate restrictions to functional independence following injury related fall impacting balance and gait.  Limited ability to correct gait impairment with amublatory aid due to right shoulder restrictions and daughter and pt agreeable to short term placement for rehab in the community.  Uncertain whether and to what extent cognition is impacted as pt has instances of memory/recall impairments.  Additionally, pt may have a vestibualr component to his balance dysfunction that cannot be fully assessed at this visit; noted difficulty with gaze stability with slow and quick head turns especially to the right and pt has difficulty focusing and following horizontal target as well.  Plan to see acutely and request referral to CSW for SNF search.    PT Assessment  Patient needs continued PT services    Follow Up Recommendations  SNF;Supervision/Assistance - 24 hour    Does the patient have the potential to tolerate intense rehabilitation      Barriers to Discharge Decreased caregiver support son cannot proved 24/7 care     Equipment Recommendations  Cane    Recommendations for Other Services     Frequency Min 3X/week    Precautions / Restrictions Precautions Precautions: Fall;Shoulder Type of Shoulder Precautions: sling to Right shoulder s/p inferior dislocation Shoulder Interventions: Shoulder sling/immobilizer;At all times (see OT eval for details) Precaution Comments: admit due to fall, history of falls in past; pt educated on risk for falls in hospital and call for nursing staff assist for OOB and back to bed.  Pt and daughter verbalized understanding Required Braces or Orthoses: Sling   Pertinent Vitals/Pain Denies pain; premedicated      Mobility  Bed Mobility Bed Mobility: Supine to Sit Supine to Sit: 3: Mod assist;HOB elevated;With rails Details for Bed Mobility Assistance: OT did assist with supine>sit however pt can be managed with 50-75% physical assist of one person.  Did not lower HOB for this first time up.  Physical assist to lift trunk and pivot over to sitting on Left EOB Transfers Transfers: Sit to Stand;Stand to Sit Sit to Stand: 3: Mod assist;Without upper extremity assist;From bed Stand to Sit: 3: Mod assist;Without upper extremity assist;To chair/3-in-1 Details for Transfer Assistance: instructional cues for right shoulder protection (arm kept in sling at all times, pt did want to move arm initailly) including no weight bearing.  Physical assist to achieve upright posture and monitor for s/s orthostasis Ambulation/Gait Ambulation/Gait Assistance: 1: +2 Total assist Ambulation/Gait: Patient Percentage: 70% Ambulation Distance (Feet): 100 Feet Assistive device: 2 person hand held assist Ambulation/Gait Assistance Details: side guard to each side for postural alignment and stability due to gait deficits (see below),  physical assist to prevent loss of balance and direct trajectory.  Multiple lines to manage Gait Pattern: Step-through pattern;Decreased stride length;Right foot  flat;Left foot flat;Shuffle;Trunk flexed;Narrow base of support Gait velocity: unmeasured, decreased General Gait Details: in room pt able to demonstrate fwd anc backward walking noting extermely shortened, hesitant steps.  Out in hallway, pt falls into pattern of shorted steps with flat foot strike that more often was on forefoot and unable to correct with cues for longer step or quicker velocity Stairs: No Wheelchair Mobility Wheelchair Mobility: No    Exercises     PT Diagnosis: Difficulty walking  PT Problem List: Decreased strength;Pain;Decreased knowledge of precautions;Decreased safety awareness;Decreased knowledge of use of DME;Decreased cognition;Decreased mobility;Decreased balance;Decreased range of motion PT Treatment Interventions: Patient/family education;Balance training;Therapeutic exercise;Therapeutic activities;Functional mobility training;Gait training;DME instruction     PT Goals(Current goals can be found in the care plan section) Acute Rehab PT Goals Patient Stated Goal: back to normal PT Goal Formulation: With patient Potential to Achieve Goals: Good  Visit Information  Last PT Received On: 04/18/13 Assistance Needed: +1 PT/OT Co-Evaluation/Treatment: Yes       Prior Functioning  Home Living Family/patient expects to be discharged to:: Skilled nursing facility Living Arrangements: Children Additional Comments: Pt lives with adult son, who cannot provide 24 hour care at d/c.  Daughter and patient both agreeable to SNF bed search. Prior Function Level of Independence: Independent Communication Communication: No difficulties Dominant Hand: Right    Cognition  Cognition Arousal/Alertness: Awake/alert Behavior During Therapy: WFL for tasks assessed/performed Overall Cognitive Status: Impaired/Different from baseline Area of Impairment: Awareness;Memory Memory: Decreased recall of precautions;Decreased short-term memory Awareness: Intellectual General  Comments: Pt seems slightly confused vs. overwhelmed by situation.  Admits amnesia to fall preadmission.  During session, noted difficulty describing home set up and prior funcitional level, also difficutly following conversation regarding plan for therapy and discharge disposition.      Extremity/Trunk Assessment Upper Extremity Assessment Upper Extremity Assessment: Defer to OT evaluation Lower Extremity Assessment Lower Extremity Assessment: Overall WFL for tasks assessed   Balance High Level Balance High Level Balance Comments: not overtly tested but extrapolated from gait mechanics and general instabity in standing.  Please assess next visit  End of Session PT - End of Session Equipment Utilized During Treatment: Gait belt (sling to right arm) Activity Tolerance: Patient tolerated treatment well Patient left: in chair;with family/visitor present;with call bell/phone within reach Nurse Communication: Mobility status  GP     Dennis Bast 04/18/2013, 2:37 PM

## 2013-04-18 NOTE — Progress Notes (Signed)
Subjective: Patient reports Overall is feeling well he has no complaints no headache is some upper extremity pain  Objective: Vital signs in last 24 hours: Temp:  [97.6 F (36.4 C)-98.6 F (37 C)] 98.4 F (36.9 C) (07/13 0410) Pulse Rate:  [53-136] 59 (07/13 0410) Resp:  [11-25] 18 (07/13 0410) BP: (84-152)/(35-76) 135/54 mmHg (07/13 0410) SpO2:  [86 %-100 %] 100 % (07/13 0410)  Intake/Output from previous day: 07/12 0701 - 07/13 0700 In: 2200 [I.V.:2200] Out: 1025 [Urine:1025] Intake/Output this shift:    Neurologically nonfocal awake alert oriented strength 5 out of 5 except with limited by right  upper extremity injury  Lab Results:  Recent Labs  04/16/13 1703  WBC 10.7*  HGB 14.2  HCT 43.2  PLT 174   BMET  Recent Labs  04/16/13 1703  NA 138  K 4.4  CL 102  CO2 24  GLUCOSE 146*  BUN 29*  CREATININE 1.08  CALCIUM 9.8    Studies/Results: Dg Shoulder Right  04/16/2013   *RADIOLOGY REPORT*  Clinical Data: Post reduction  RIGHT SHOULDER - 2+ VIEW  Comparison: 04/16/2013  Findings: Inferior shoulder dislocation has been reduced. Impaction fracture of the articular surface of the humeral head cannot be excluded with a cortical defect noted.  Suboptimal projections are available.  There is degenerative change in the [facility] joint.  IMPRESSION: Right shoulder reduction.  Possible impaction fracture of the humeral head.   Original Report Authenticated By: Janeece Riggers, M.D.   Ct Head Wo Contrast  04/17/2013   *RADIOLOGY REPORT*  Clinical Data: Followup subarachnoid hemorrhage.  CT HEAD WITHOUT CONTRAST  Technique:  Contiguous axial images were obtained from the base of the skull through the vertex without contrast.  Comparison: 04/16/2013  Findings: Small amount of subarachnoid blood left frontal region unchanged.  No new intracranial hemorrhage noted.  No skull fracture.  Small vessel disease type changes without CT evidence of large acute infarct.  No intracranial mass  lesion detected on this unenhanced exam.  Global atrophy without hydrocephalus.  IMPRESSION: Small amount of subarachnoid blood left frontal region unchanged. No new intracranial hemorrhage noted.   Original Report Authenticated By: Lacy Duverney, M.D.   Ct Head Wo Contrast  04/16/2013   *RADIOLOGY REPORT*  Clinical Data:  Larey Seat.  CT HEAD WITHOUT CONTRAST CT CERVICAL SPINE WITHOUT CONTRAST  Technique:  Multidetector CT imaging of the head and cervical spine was performed following the standard protocol without intravenous contrast.  Multiplanar CT image reconstructions of the cervical spine were also generated.  Comparison:  Previous examinations.  CT HEAD  Findings: No significant change in diffuse enlargement of the ventricles and subarachnoid spaces and patchy white matter low density in both cerebral hemispheres.  Interval small amount of focal hemorrhage deep in the left central sulcus or adjacent cortex.  No other intracranial hemorrhage.  No skull fractures or paranasal sinus air-fluid levels.  IMPRESSION:  1.  Small amount of focal subarachnoid hemorrhage in the deep portion of the left central sulcus versus focal cortical hemorrhage at that location. 2.  Stable atrophy and chronic small vessel white matter ischemic changes.  CT CERVICAL SPINE  Findings: Multilevel degenerative changes.  These include extensive facet degenerative changes of multiple levels with minimal anterolisthesis at the C2-3 level and minimal retrolisthesis at the C3-4, C4-5 and C5-6 levels.  No fractures or prevertebral soft tissue swelling.  IMPRESSION:  1.  No fracture or traumatic subluxation. 2.  Extensive degenerative changes with associated minimal subluxations, as described above.  Original Report Authenticated By: Beckie Salts, M.D.   Ct Cervical Spine Wo Contrast  04/16/2013   *RADIOLOGY REPORT*  Clinical Data:  Larey Seat.  CT HEAD WITHOUT CONTRAST CT CERVICAL SPINE WITHOUT CONTRAST  Technique:  Multidetector CT imaging of the  head and cervical spine was performed following the standard protocol without intravenous contrast.  Multiplanar CT image reconstructions of the cervical spine were also generated.  Comparison:  Previous examinations.  CT HEAD  Findings: No significant change in diffuse enlargement of the ventricles and subarachnoid spaces and patchy white matter low density in both cerebral hemispheres.  Interval small amount of focal hemorrhage deep in the left central sulcus or adjacent cortex.  No other intracranial hemorrhage.  No skull fractures or paranasal sinus air-fluid levels.  IMPRESSION:  1.  Small amount of focal subarachnoid hemorrhage in the deep portion of the left central sulcus versus focal cortical hemorrhage at that location. 2.  Stable atrophy and chronic small vessel white matter ischemic changes.  CT CERVICAL SPINE  Findings: Multilevel degenerative changes.  These include extensive facet degenerative changes of multiple levels with minimal anterolisthesis at the C2-3 level and minimal retrolisthesis at the C3-4, C4-5 and C5-6 levels.  No fractures or prevertebral soft tissue swelling.  IMPRESSION:  1.  No fracture or traumatic subluxation. 2.  Extensive degenerative changes with associated minimal subluxations, as described above.   Original Report Authenticated By: Beckie Salts, M.D.   Dg Shoulder Right Port  04/16/2013   *RADIOLOGY REPORT*  Clinical Data: Status post fall.  Unable to move the right arm. Right arm pain.  PORTABLE RIGHT SHOULDER - 2+ VIEW  Comparison: None.  Findings: The right arm is in extreme abduction consistent with inferior shoulder dislocation or luxatio erecta.  No fracture is identified.  The acromioclavicular joint is intact.  Imaged lung parenchyma is clear.  IMPRESSION: Inferior shoulder dislocation or luxatio erecta.   Original Report Authenticated By: Holley Dexter, M.D.    Assessment/Plan: Continue mobilizes with physical therapy discharge when medically stable does  not need any more neurosurgical intervention  LOS: 2 days     Mertis Mosher P 04/18/2013, 10:57 AM

## 2013-04-18 NOTE — Evaluation (Signed)
Occupational Therapy Evaluation Patient Details Name: Eric Cooley MRN: [id number] DOB: Sep 09, 1920 Today's Date: 04/18/2013 Time: 4098-1191 OT Time Calculation (min): 42 min  OT Assessment / Plan / Recommendation History of present illness   HPI: Patient is a 79 year old some is as on Thursday had a syncopal episode was working outside and heat fell back structures head he was out for an unknown period time went to the emergency room in last night for evaluation of headaches or experiencing some jaw pain worse on the right it's progressed over to include the left jaw has a mild headache otherwise no neck pain denies any numbness tingling his arms or his legs I does have some pain in the right upper extremity around the collarbone has pain on movement of right upper extremity. Imaging shows right shoulder inferior subluxation and small SAH.    Clinical Impression   Pt requiring assist for all functional mobility and ADLs due to balance deficits and decreased functional use of LUE. Pt and daughter both agreeable to ST SNF since pt does not have reliable 24/7 assist at home.  Will continue to follow acutely in order to address below problem list.    OT Assessment  Patient needs continued OT Services    Follow Up Recommendations  SNF;Supervision/Assistance - 24 hour    Barriers to Discharge Decreased caregiver support Pt lives with son, but daughter reports that son cannot provide constant 24/7 assist.  Equipment Recommendations   (TBD)    Recommendations for Other Services    Frequency  Min 2X/week    Precautions / Restrictions Precautions Precautions: Fall;Shoulder Type of Shoulder Precautions: sling to Right shoulder s/p inferior dislocation Shoulder Interventions: Shoulder sling/immobilizer Precaution Comments: admit due to fall, history of falls in past; pt educated on risk for falls in hospital and call for nursing staff assist for OOB and back to bed.  Pt and daughter verbalized  understanding Required Braces or Orthoses: Sling   Pertinent Vitals/Pain See vitals    ADL  Upper Body Bathing: Simulated;Maximal assistance Where Assessed - Upper Body Bathing: Unsupported sitting Upper Body Dressing: Performed;Maximal assistance Where Assessed - Upper Body Dressing: Unsupported sitting Toilet Transfer: Simulated;+2 Total assistance Toilet Transfer: Patient Percentage: 70% Toilet Transfer Method: Sit to stand Toilet Transfer Equipment:  (bed, chair) Equipment Used:  (right shoulder sling) Transfers/Ambulation Related to ADLs: +2 total assist for ambulation in room and hall. +2 for line management.  Pt at first walking with slow shuffled steps but able to pick up speed and a little more stability with VCs for technique. ADL Comments: Max assist to reposition sling and gown once sitting EOB.      OT Diagnosis: Generalized weakness;Acute pain  OT Problem List: Decreased strength;Decreased activity tolerance;Impaired balance (sitting and/or standing);Decreased knowledge of use of DME or AE;Impaired UE functional use;Pain OT Treatment Interventions: Self-care/ADL training;DME and/or AE instruction;Therapeutic activities;Patient/family education;Balance training   OT Goals(Current goals can be found in the care plan section) Acute Rehab OT Goals Patient Stated Goal: back to normal OT Goal Formulation: With patient Time For Goal Achievement: 04/25/13 Potential to Achieve Goals: Good  Visit Information  Last OT Received On: 04/18/13 Assistance Needed: +1 PT/OT Co-Evaluation/Treatment: Yes       Prior Functioning     Home Living Family/patient expects to be discharged to:: Skilled nursing facility Living Arrangements: Children Additional Comments: Pt lives with adult son, who cannot provide 24 hour care at d/c.  Daughter and patient both agreeable to SNF bed search. Prior Function  Level of Independence: Independent Communication Communication: No  difficulties Dominant Hand: Right         Vision/Perception     Cognition  Cognition Arousal/Alertness: Awake/alert Behavior During Therapy: WFL for tasks assessed/performed Overall Cognitive Status: Impaired/Different from baseline Area of Impairment: Awareness;Memory Memory: Decreased recall of precautions;Decreased short-term memory Awareness: Intellectual General Comments: Pt seems slightly confused vs. overwhelmed by situation.  Admits amnesia to fall preadmission.  During session, noted difficulty describing home set up and prior funcitional level, also difficutly following conversation regarding plan for therapy and discharge disposition.      Extremity/Trunk Assessment Upper Extremity Assessment Upper Extremity Assessment: RUE deficits/detail RUE Deficits / Details: RUE to remain in sling per MD note.  RUE: Unable to fully assess due to immobilization Lower Extremity Assessment Lower Extremity Assessment: Overall WFL for tasks assessed     Mobility Bed Mobility Bed Mobility: Supine to Sit;Sitting - Scoot to Edge of Bed Supine to Sit: 3: Mod assist;HOB elevated;With rails Sitting - Scoot to Edge of Bed: 4: Min assist Details for Bed Mobility Assistance: Assist to support trunk OOB due to pt unable to use RUE . Transfers Transfers: Sit to Stand;Stand to Sit Sit to Stand: 3: Mod assist;From bed;With upper extremity assist (with LUE Assist) Stand to Sit: 3: Mod assist;To chair/3-in-1;With armrests;With upper extremity assist (with LUE assist) Details for Transfer Assistance: instructional cues for right shoulder protection (arm kept in sling at all times, pt did want to move arm initailly) including no weight bearing.  Physical assist to achieve upright posture and monitor for s/s orthostasis     Exercise     Balance High Level Balance High Level Balance Comments: not overtly tested but extrapolated from gait mechanics and general instabity in standing.  Please assess  next visit   End of Session OT - End of Session Activity Tolerance: Patient tolerated treatment well Patient left: in chair;with call bell/phone within reach;with family/visitor present Nurse Communication: Mobility status  GO   04/18/2013 Cipriano Mile OTR/L Pager [phone] Office [phone]   Cipriano Mile 04/18/2013, 3:47 PM

## 2013-04-19 LAB — URINALYSIS, ROUTINE W REFLEX MICROSCOPIC
Bilirubin Urine: NEGATIVE
Glucose, UA: NEGATIVE mg/dL
Ketones, ur: NEGATIVE mg/dL
Leukocytes, UA: NEGATIVE
Protein, ur: NEGATIVE mg/dL
pH: 5.5 (ref 5.0–8.0)

## 2013-04-19 MED ORDER — POLYETHYLENE GLYCOL 3350 17 G PO PACK
17.0000 g | PACK | Freq: Every day | ORAL | Status: DC
  Administered 2013-04-19 – 2013-04-20 (×2): 17 g via ORAL
  Filled 2013-04-19 (×2): qty 1

## 2013-04-19 NOTE — Progress Notes (Signed)
Occupational Therapy Treatment Patient Details Name: Eric Cooley MRN: [id number] DOB: 1920/08/24 Today's Date: 04/19/2013 Time: 9528-4132 OT Time Calculation (min): 27 min  OT Assessment / Plan / Recommendation  OT comments  Pt progressing with independence with functional mobility and ability to perform basic grooming activities.  Pt incontinent of urine while standing to brush teeth.   Follow Up Recommendations  SNF;Supervision/Assistance - 24 hour    Barriers to Discharge       Equipment Recommendations  None recommended by OT    Recommendations for Other Services    Frequency Min 2X/week   Progress towards OT Goals Progress towards OT goals:  (Goals not establishe on eval.  Goals set today)  Plan Discharge plan remains appropriate    Precautions / Restrictions Precautions Precautions: Fall;Shoulder Type of Shoulder Precautions: sling to Right shoulder s/p inferior dislocation Shoulder Interventions: Shoulder sling/immobilizer Precaution Comments: admit due to fall, history of falls in past; pt educated on risk for falls in hospital and call for nursing staff assist for OOB and back to bed.  Pt and daughter verbalized understanding Required Braces or Orthoses: Sling   Pertinent Vitals/Pain     ADL  Eating/Feeding: Set up;Supervision/safety Where Assessed - Eating/Feeding: Chair Grooming: Teeth care;Wash/dry hands;Minimal assistance Where Assessed - Grooming: Supported standing Lower Body Bathing: Moderate assistance Where Assessed - Lower Body Bathing: Supported sit to stand Lower Body Dressing: Maximal assistance Where Assessed - Lower Body Dressing: Supported sit to stand Toilet Transfer: Minimal assistance Toilet Transfer Method: Sit to stand;Stand pivot Acupuncturist: Bedside commode;Grab bars Toileting - Clothing Manipulation and Hygiene: Maximal assistance Where Assessed - Glass blower/designer Manipulation and Hygiene: Sit to stand from 3-in-1 or  toilet Transfers/Ambulation Related to ADLs: min a in room ADL Comments: Pt brushed teeth with assist for set up and min A for standing balance.  Pt required verbal cues for thoroughness, and for sequencing.  Pt noted to required min verbal cues to use Lt. UE to drink from cup - attempted to use Rt. UE, but when he realized he couldn't, required 3 prompts to use Lt. UE (question if he has a component of Lt. inattention. Pt incontinent of urine while standing at sink.  Assisted with clean up    OT Diagnosis:    OT Problem List:   OT Treatment Interventions:     OT Goals(current goals can now be found in the care plan section) Acute Rehab OT Goals Patient Stated Goal: get back to where I was OT Goal Formulation: With patient Time For Goal Achievement: 04/25/13 Potential to Achieve Goals: Good ADL Goals Pt Will Perform Grooming: with min guard assist Pt Will Perform Upper Body Bathing: with mod assist;sitting Pt Will Perform Lower Body Bathing: with min assist;sit to/from stand Pt Will Perform Upper Body Dressing: with mod assist;sitting Pt Will Perform Lower Body Dressing: with min assist;sit to/from stand Pt Will Transfer to Toilet: with min guard assist;regular height toilet;ambulating;grab bars Pt Will Perform Toileting - Clothing Manipulation and hygiene: with min guard assist;sit to/from stand  Visit Information  Last OT Received On: 04/19/13 Assistance Needed: +1    Subjective Data      Prior Functioning       Cognition  Cognition Arousal/Alertness: Awake/alert Behavior During Therapy: Impulsive Overall Cognitive Status: Impaired/Different from baseline Area of Impairment: Attention;Memory;Safety/judgement;Awareness;Problem solving Current Attention Level: Sustained Memory: Decreased short-term memory Safety/Judgement: Decreased awareness of safety;Decreased awareness of deficits Awareness: Intellectual Problem Solving: Slow processing;Decreased initiation;Difficulty  sequencing;Requires verbal cues;Requires tactile cues  General Comments: See ADL comments.  Pt also attempted to eat ice cream straight from cup - required verbal cues to use spoone.    Mobility  Bed Mobility Bed Mobility: Not assessed Transfers Transfers: Sit to Stand;Stand to Sit Sit to Stand: 4: Min assist;With upper extremity assist;From toilet;From chair/3-in-1 Stand to Sit: 4: Min assist;With upper extremity assist;To chair/3-in-1;To toilet Details for Transfer Assistance: verbal cues for hand placement and for problem solving; assist for balance    Exercises      Balance     End of Session OT - End of Session Activity Tolerance: Patient tolerated treatment well Patient left: in chair;with call bell/phone within reach;with family/visitor present  GO     Kristilyn Coltrane, Ursula Alert M 04/19/2013, 3:54 PM

## 2013-04-19 NOTE — Progress Notes (Signed)
Patient ID: Eric Cooley, male   DOB: 05-29-1920, 79 y.o.   MRN: [id number]  LOS: 3 days   Subjective: Pt has pain on the right side with movement.  Denies shortness of breath, chest pains.  Last bm on Thursday.  OOB with nursing.  Lives with son, however, states he son is "unreliable" and has a "disability."  Objective: Vital signs in last 24 hours: Temp:  [98.2 F (36.8 C)-98.6 F (37 C)] 98.2 F (36.8 C) (07/14 1005) Pulse Rate:  [69-97] 97 (07/14 1005) Resp:  [16-20] 16 (07/14 1005) BP: (97-139)/(47-72) 139/63 mmHg (07/14 1005) SpO2:  [92 %-97 %] 92 % (07/14 1005) Last BM Date: 04/16/13  Lab Results:  CBC  Recent Labs  04/16/13 1703  WBC 10.7*  HGB 14.2  HCT 43.2  PLT 174   BMET  Recent Labs  04/16/13 1703  NA 138  K 4.4  CL 102  CO2 24  GLUCOSE 146*  BUN 29*  CREATININE 1.08  CALCIUM 9.8    Imaging: Ct Head Wo Contrast  04/17/2013   *RADIOLOGY REPORT*  Clinical Data: Followup subarachnoid hemorrhage.  CT HEAD WITHOUT CONTRAST  Technique:  Contiguous axial images were obtained from the base of the skull through the vertex without contrast.  Comparison: 04/16/2013  Findings: Small amount of subarachnoid blood left frontal region unchanged.  No new intracranial hemorrhage noted.  No skull fracture.  Small vessel disease type changes without CT evidence of large acute infarct.  No intracranial mass lesion detected on this unenhanced exam.  Global atrophy without hydrocephalus.  IMPRESSION: Small amount of subarachnoid blood left frontal region unchanged. No new intracranial hemorrhage noted.   Original Report Authenticated By: Lacy Duverney, M.D.     PE: General appearance: alert, cooperative, appears stated age and no distress Eyes: conjunctivae/corneas clear. PERRL, EOM's intact. Fundi benign., right periorbital ecchymosis, sutures Resp: clear to auscultation bilaterally Cardio: regular rate and rhythm, S1, S2 normal, no murmur, click, rub or gallop GI: soft,  non-tender; bowel sounds normal; no masses,  no organomegaly Ext: sling to right arm   Patient Active Problem List   Diagnosis Date Noted  . Subarachnoid hemorrhage following injury 04/16/2013  . Shoulder dislocation-right 04/16/2013  . Intermittent low back pain 04/15/2012  . Constipation 03/04/2012  . Dysuria 03/04/2012  . Pneumonia 01/29/2012  . Dizziness 12/18/2011  . Balance disorder 12/18/2011  . Pre-ulcerative corn or callous 10/13/2011  . DJD (degenerative joint disease), lumbar 04/01/2011  . CAD (coronary artery disease) 03/29/2011  . Prostate cancer 03/29/2011  . 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 HTN (hypertension) 03/29/2011  . Hyperlipidemia 03/29/2011  . BPH (benign prostatic hypertrophy) 03/29/2011  . GERD (gastroesophageal reflux disease) 03/29/2011  . Preventative health care 03/29/2011  . S/P cholecystectomy 03/29/2011  . Allergic rhinitis, cause unspecified 03/29/2011  . S/P partial gastrectomy 03/29/2011  . Anemia 03/29/2011   Assessment/Plan: Fall  Right should dislocation -non surgical -sling -Follow up with Dr. Rennis Chris 10 days [facility] -repeat CT stable -Dr. Wynetta Emery, mobilize, no further NSU follow up Right eye lac -closed 7/11 will need sutures removed Urinary frequency and burning Obtain UA VTE - SCD's, mobilize, no lovenox FEN - regular diet Dispo -- PT/OT recommending 24h assistance of SNF, pt does not have such help at home.  Consult to social work for placement, he is open to any place except [facility], [place] Pager: [phone] General Trauma PA Pager: [phone]   04/19/2013 11:06 AM

## 2013-04-19 NOTE — Clinical Social Work Note (Addendum)
Clinical Social Work Department CLINICAL SOCIAL WORK PLACEMENT NOTE 04/19/2013  Patient:  Eric Cooley, Eric Cooley  Account Number:  [id number] Admit date:  04/16/2013  Clinical Social Worker:  Macario Golds, LCSW  Date/time:  04/19/2013 12:30 PM  Clinical Social Work is seeking post-discharge placement for this patient at the following level of care:   SKILLED NURSING   (*CSW will update this form in Epic as items are completed)   04/19/2013  Patient/family provided with [facility] Department of Clinical Social Work's list of facilities offering this level of care within the geographic area requested by the patient (or if unable, by the patient's family).  04/19/2013  Patient/family informed of their freedom to choose among providers that offer the needed level of care, that participate in Medicare, Medicaid or managed care program needed by the patient, have an available bed and are willing to accept the patient.  04/19/2013  Patient/family informed of [facility]' ownership interest in [facility], as well as of the fact that they are under no obligation to receive care at this facility.  PASARR submitted to EDS on 04/19/2013 PASARR number received from [employer] on 04/19/2013  FL2 transmitted to all facilities in geographic area requested by pt/family on  04/19/2013 FL2 transmitted to all facilities within larger geographic area on   Patient informed that his/her managed care company has contracts with or will negotiate with  certain facilities, including the following:     Patient/family informed of bed offers received:  04/19/2013 Patient chooses bed at [facility] Physician recommends and patient chooses bed at    Patient to be transferred to [facility] on  04/20/2013 Patient to be transferred to facility by [facility]  The following physician request were entered in Epic:   Additional Comments:

## 2013-04-19 NOTE — Progress Notes (Signed)
Physical Therapy Treatment Patient Details Name: Eric Cooley MRN: [id number] DOB: 1920/02/01 Today's Date: 04/19/2013 Time: 7829-5621 PT Time Calculation (min): 14 min  PT Assessment / Plan / Recommendation  PT Comments   Pt able to increase amb today. Cont to demo balance deficits; primarily when turning or during dynamic activities. Pt is a fall risk. Cont to recommend SNF upon acute D/C to increase independence with gt and transfers and reduce risk of falls.   Follow Up Recommendations  SNF;Supervision/Assistance - 24 hour     Does the patient have the potential to tolerate intense rehabilitation     Barriers to Discharge        Equipment Recommendations  Cane    Recommendations for Other Services    Frequency Min 3X/week   Progress towards PT Goals Progress towards PT goals: Progressing toward goals  Plan Current plan remains appropriate    Precautions / Restrictions Precautions Precautions: Fall;Shoulder Type of Shoulder Precautions: sling to Right shoulder s/p inferior dislocation Shoulder Interventions: Shoulder sling/immobilizer Precaution Comments: admit due to fall, history of falls in past; pt educated on risk for falls in hospital and call for nursing staff assist for OOB and back to bed.  Pt and daughter verbalized understanding Required Braces or Orthoses: Sling   Pertinent Vitals/Pain 4/10 in R shoulder    Mobility  Bed Mobility Bed Mobility: Supine to Sit Supine to Sit: 4: Min assist;HOB elevated;With rails Details for Bed Mobility Assistance: attempted to have pt use bed rails for supine to sit transfer; pt had difficulty with this concept and preferred using handheld (A) to advance trunk to upright position  Transfers Transfers: Sit to Stand;Stand to Sit Sit to Stand: 3: Mod assist;From bed;With upper extremity assist Stand to Sit: 4: Min assist;With upper extremity assist;To chair/3-in-1 Details for Transfer Assistance: verbal cues for safety and  hand placement; required phyiscal (A) to achieve upright posture; no s/s of hypotension  Ambulation/Gait Ambulation/Gait Assistance: 3: Mod assist Ambulation Distance (Feet): 50 Feet Assistive device: 1 person hand held assist Ambulation/Gait Assistance Details: side guard to L side for hand held (A) and safety; pt has difficulty with managing turns; noted LOB but recovered with mod (A); pt has tendency to scissor amb and R LE will catch on L LE causing LOB; mulimodal cues for upright posture  Gait Pattern: Step-through pattern;Decreased stride length;Right foot flat;Left foot flat;Shuffle;Trunk flexed;Narrow base of support;Scissoring Gait velocity: unmeasured, decreased Stairs: No Wheelchair Mobility Wheelchair Mobility: No    Exercises     PT Diagnosis:    PT Problem List:   PT Treatment Interventions:     PT Goals (current goals can now be found in the care plan section) Acute Rehab PT Goals Patient Stated Goal: get back to where I was PT Goal Formulation: With patient Potential to Achieve Goals: Good  Visit Information  Last PT Received On: 04/19/13 Assistance Needed: +1    Subjective Data  Subjective: pt lying supine with tech present; agreeable to walk and therapy  Patient Stated Goal: get back to where I was   Cognition  Cognition Arousal/Alertness: Awake/alert Behavior During Therapy: Impulsive Overall Cognitive Status: Impaired/Different from baseline Area of Impairment: Awareness;Memory;Problem solving Memory: Decreased recall of precautions Awareness: Intellectual Problem Solving: Decreased initiation;Slow processing General Comments: pt cont to seem confused; requires max cues for simple one step commands and demo delayed processing and confusion with instructions     Balance  Balance Balance Assessed: Yes Static Sitting Balance Static Sitting - Balance Support:  Left upper extremity supported;Feet supported Static Sitting - Level of Assistance: 5: Stand by  assistance Static Sitting - Comment/# of Minutes: pt tolerated sitting EOB ~5 min to re-donn R UE sling; pt with continous grip on L hand rails; denies any s/s of orthostatic hypotension High Level Balance High Level Balance Comments: pt demo difficulty managing turns; demo LOB and required (A) to maintain balance; with head turns horizontally pt will scissor amb and become unsteady   End of Session PT - End of Session Equipment Utilized During Treatment: Gait belt;Other (comment) (R UE sling ) Activity Tolerance: Patient tolerated treatment well Patient left: in chair;with call bell/phone within reach Nurse Communication: Mobility status;Other (comment) (no chair alarm needed per RN)   GP     Donell Sievert, [place] [phone] 04/19/2013, 10:31 AM
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
## 2013-04-20 MED ORDER — DSS 100 MG PO CAPS: 100.0000 mg | ORAL_CAPSULE | Freq: Two times a day (BID) | ORAL | Status: DC

## 2013-04-20 MED ORDER — ASPIRIN 81 MG PO TBEC: 81.0000 mg | DELAYED_RELEASE_TABLET | Freq: Every day | ORAL | Status: DC

## 2013-04-20 MED ORDER — HYDROCODONE-ACETAMINOPHEN 5-325 MG PO TABS: 1.0000 | ORAL_TABLET | Freq: Three times a day (TID) | ORAL | Status: DC | PRN

## 2013-04-20 NOTE — Discharge Summary (Signed)
Physician Discharge Summary  Eric Cooley:[id number] DOB: [birth year] DOA: 04/16/2013  PCP: Oliver Barre, MD  Consultation: Dr. Cram(Neurosurgery)   Dr. Supple(orthopedic surgery, phone consult only)  Admit date: 04/16/2013 Discharge date: 04/20/2013  Recommendations for Outpatient Follow-up:   Follow-up Information   Follow up with Senaida Lange, MD. Schedule an appointment as soon as possible for a visit on 04/29/2013. (APPOINTMENT TIME: 8:45AM.  PLEASE ARRIVE PRIOR TO YOUR APPOINTMENT.  THIS IS AN APPOINTMENT WITH THE ORTHOPEDIC DOCTOR REGARDING YOUR SHOULDER)    Contact information:   [address] [place] [address] [phone]       Follow up with [facility] On 04/23/2013. (As needed)    Contact information:   [address] [place] [address] [phone]      Discharge Diagnoses:  1. Ground floor fall 2. Right shoulder dislocation 3. Subarachnoid hemorrhage 4. Right eyebrow laceration 5. BPH 6. Hypertension  PMH Myocardial infarction Benign gastric tumor CAD HTN HLD BPH GERD S/p cholecystectomy Thrombocytopenia Dizziness Allergic rhiitis S/p partial gastrectomy Anemia Arthritis Urinary incontinence Low back pain/DJD Prostate cancer   Surgical Procedure: none   Discharge Condition: stable [facility]  Diet recommendation: heart healthy  Filed Weights   04/16/13 1805 04/17/13 0800  Weight: 140 lb 6.9 oz (63.7 kg) 132 lb 0.9 oz (59.9 kg)    Filed Vitals:   04/20/13 0600  BP: 129/56  Pulse: 66  Temp: 98.4 F (36.9 C)  Resp: 18   Hospital Course:  Mr. Montefusco presented to [facility] ED following a fall in which he tripped and fell.  He had some amnesia and was noted to have right arm pain and a laceration to right brow line.  LOC was also noted.  After suturing the laceration, he was transferred to [facility] under trauma service for further monitoring.  He was  found to have a subarachnoid hemorrhage, which was stable.  He was transferred to the neuro flood, mobilized with PT/OT.  Dr. Wynetta Emery was consulted for further management of SAH.  He was found to have a shoulder dislocation, Dr. Rennis Chris was called who reviewed imaging and recommended a follow up in his office in 7-10 days.  A repeat CT of head showed unchanged SAH and no new intracranial hemorrhage.  He remained neurologically intact.  The patient was going to be discharged home, but has a son with psychiatric issues and therefore not dependable to help care, it was then recommended that the patient be placed in a skilled nursing facility until he improves.  He is able to get up on his own and complete ADLs.  He is to wear sling.  A follow up appointment has been made on behalf of the patient with Dr. Rennis Chris.  He does not need to follow up with Dr. Cram(NS[facility]).  He developed urinary frequency, UA was negative and attributed to BPH, his meds were resumed.  Would recommend holding ASA and Motrin for several weeks until his symptoms fully resolve.  His pain has been well managed using tylenol and prn norco.  Sutures to right brow line were removed, edges approximated and no erythema.  Continue to provide local care.   PE:  General appearance: alert, cooperative, appears stated age and no distress  Eyes: conjunctivae/corneas clear. PERRL, EOM's intact. Fundi benign., right periorbital ecchymosis, sutures removed, edges approximated no erythema.  Resp: clear to auscultation bilaterally  Cardio: regular rate and rhythm, S1, S2 normal, no  murmur, click, rub or gallop  GI: soft, non-tender; bowel sounds normal; no masses, no organomegaly  Ext: sling to right arm  Discharge Instructions   Future Appointments Provider Department Dept Phone   07/16/2013 2:30 PM Corwin Levins, MD [facility] [phone]       Medication List    STOP taking these medications       naproxen sodium 220 MG  tablet  Commonly known as:  ANAPROX      TAKE these medications       acetaminophen 500 MG tablet  Commonly known as:  TYLENOL  Take 500 mg by mouth every 6 (six) hours as needed for pain.     aspirin 81 MG EC tablet  Take 1 tablet (81 mg total) by mouth daily.  Start taking on:  05/04/2013     DSS 100 MG Caps  Take 100 mg by mouth 2 (two) times daily.     FLOMAX 0.4 MG Caps  Generic drug:  tamsulosin  Take by mouth daily.     HYDROcodone-acetaminophen 5-325 MG per tablet  Commonly known as:  NORCO/VICODIN  Take 1 tablet by mouth every 8 (eight) hours as needed (FOR MODERATE TO SEVERE PAIN).     ICAPS Caps  Take 1 capsule by mouth daily.     lisinopril 5 MG tablet  Commonly known as:  PRINIVIL,ZESTRIL  Take 5 mg by mouth daily.     NITROSTAT 0.4 MG SL tablet  Generic drug:  nitroGLYCERIN  place 1 tablet under the tongue every 5 minutes for 3 doses if needed for chest pain     pantoprazole 20 MG tablet  Commonly known as:  PROTONIX  Take 20 mg by mouth daily.     polyethylene glycol packet  Commonly known as:  MIRALAX / GLYCOLAX  Take 17 g by mouth daily.     simvastatin 10 MG tablet  Commonly known as:  ZOCOR  Take 10 mg by mouth daily.           Follow-up Information   Follow up with Senaida Lange, MD. Schedule an appointment as soon as possible for a visit on 04/29/2013. (APPOINTMENT TIME: 8:45AM.  PLEASE ARRIVE PRIOR TO YOUR APPOINTMENT.  THIS IS AN APPOINTMENT WITH THE ORTHOPEDIC DOCTOR REGARDING YOUR SHOULDER)    Contact information:   [address] [place] [address] [phone]       Follow up with [facility] On 04/23/2013. (As needed)    Contact information:   [address] [place] [address] [phone]        The results of significant diagnostics from this hospitalization (including imaging, microbiology, ancillary and laboratory) are listed below for reference.    Significant  Diagnostic Studies: Dg Shoulder Right  04/16/2013   *RADIOLOGY REPORT*  Clinical Data: Post reduction  RIGHT SHOULDER - 2+ VIEW  Comparison: 04/16/2013  Findings: Inferior shoulder dislocation has been reduced. Impaction fracture of the articular surface of the humeral head cannot be excluded with a cortical defect noted.  Suboptimal projections are available.  There is degenerative change in the [facility] joint.  IMPRESSION: Right shoulder reduction.  Possible impaction fracture of the humeral head.   Original Report Authenticated By: Janeece Riggers, M.D.   Ct Head Wo Contrast  04/17/2013   *RADIOLOGY REPORT*  Clinical Data: Followup subarachnoid hemorrhage.  CT HEAD WITHOUT CONTRAST  Technique:  Contiguous axial images were obtained from the base of  the skull through the vertex without contrast.  Comparison: 04/16/2013  Findings: Small amount of subarachnoid blood left frontal region unchanged.  No new intracranial hemorrhage noted.  No skull fracture.  Small vessel disease type changes without CT evidence of large acute infarct.  No intracranial mass lesion detected on this unenhanced exam.  Global atrophy without hydrocephalus.  IMPRESSION: Small amount of subarachnoid blood left frontal region unchanged. No new intracranial hemorrhage noted.   Original Report Authenticated By: Lacy Duverney, M.D.   Ct Head Wo Contrast  04/16/2013   *RADIOLOGY REPORT*  Clinical Data:  Larey Seat.  CT HEAD WITHOUT CONTRAST CT CERVICAL SPINE WITHOUT CONTRAST  Technique:  Multidetector CT imaging of the head and cervical spine was performed following the standard protocol without intravenous contrast.  Multiplanar CT image reconstructions of the cervical spine were also generated.  Comparison:  Previous examinations.  CT HEAD  Findings: No significant change in diffuse enlargement of the ventricles and subarachnoid spaces and patchy white matter low density in both cerebral hemispheres.  Interval small amount of focal hemorrhage deep in the  left central sulcus or adjacent cortex.  No other intracranial hemorrhage.  No skull fractures or paranasal sinus air-fluid levels.  IMPRESSION:  1.  Small amount of focal subarachnoid hemorrhage in the deep portion of the left central sulcus versus focal cortical hemorrhage at that location. 2.  Stable atrophy and chronic small vessel white matter ischemic changes.  CT CERVICAL SPINE  Findings: Multilevel degenerative changes.  These include extensive facet degenerative changes of multiple levels with minimal anterolisthesis at the C2-3 level and minimal retrolisthesis at the C3-4, C4-5 and C5-6 levels.  No fractures or prevertebral soft tissue swelling.  IMPRESSION:  1.  No fracture or traumatic subluxation. 2.  Extensive degenerative changes with associated minimal subluxations, as described above.   Original Report Authenticated By: Beckie Salts, M.D.   Ct Cervical Spine Wo Contrast  04/16/2013   *RADIOLOGY REPORT*  Clinical Data:  Larey Seat.  CT HEAD WITHOUT CONTRAST CT CERVICAL SPINE WITHOUT CONTRAST  Technique:  Multidetector CT imaging of the head and cervical spine was performed following the standard protocol without intravenous contrast.  Multiplanar CT image reconstructions of the cervical spine were also generated.  Comparison:  Previous examinations.  CT HEAD  Findings: No significant change in diffuse enlargement of the ventricles and subarachnoid spaces and patchy white matter low density in both cerebral hemispheres.  Interval small amount of focal hemorrhage deep in the left central sulcus or adjacent cortex.  No other intracranial hemorrhage.  No skull fractures or paranasal sinus air-fluid levels.  IMPRESSION:  1.  Small amount of focal subarachnoid hemorrhage in the deep portion of the left central sulcus versus focal cortical hemorrhage at that location. 2.  Stable atrophy and chronic small vessel white matter ischemic changes.  CT CERVICAL SPINE  Findings: Multilevel degenerative changes.  These  include extensive facet degenerative changes of multiple levels with minimal anterolisthesis at the C2-3 level and minimal retrolisthesis at the C3-4, C4-5 and C5-6 levels.  No fractures or prevertebral soft tissue swelling.  IMPRESSION:  1.  No fracture or traumatic subluxation. 2.  Extensive degenerative changes with associated minimal subluxations, as described above.   Original Report Authenticated By: Beckie Salts, M.D.   Dg Shoulder Right Port  04/16/2013   *RADIOLOGY REPORT*  Clinical Data: Status post fall.  Unable to move the right arm. Right arm pain.  PORTABLE RIGHT SHOULDER - 2+ VIEW  Comparison: None.  Findings: The  right arm is in extreme abduction consistent with inferior shoulder dislocation or luxatio erecta.  No fracture is identified.  The acromioclavicular joint is intact.  Imaged lung parenchyma is clear.  IMPRESSION: Inferior shoulder dislocation or luxatio erecta.   Original Report Authenticated By: Holley Dexter, M.D.    Microbiology: Recent Results (from the past 240 hour(s))  MRSA PCR SCREENING     Status: None   Collection Time    04/17/13  1:01 AM      Result Value Range Status   MRSA by PCR NEGATIVE  NEGATIVE Final   Comment:            The GeneXpert MRSA Assay (FDA     approved for NASAL specimens     only), is one component of a     comprehensive MRSA colonization     surveillance program. It is not     intended to diagnose MRSA     infection nor to guide or     monitor treatment for     MRSA infections.     Labs: Basic Metabolic Panel:  Recent Labs Lab 04/16/13 1703  NA 138  K 4.4  CL 102  CO2 24  GLUCOSE 146*  BUN 29*  CREATININE 1.08  CALCIUM 9.8   Liver Function Tests:  Recent Labs Lab 04/16/13 1703  AST 24  ALT 15  ALKPHOS 68  BILITOT 0.3  PROT 7.2  ALBUMIN 3.7   CBC:  Recent Labs Lab 04/16/13 1703  WBC 10.7*  NEUTROABS 9.4*  HGB 14.2  HCT 43.2  MCV 97.7  PLT 174   Active Problems:   Subarachnoid hemorrhage  following injury   Shoulder dislocation-right   Time coordinating discharge: 30 mins  Signed:  Nathanyl Andujo, ANP-BC

## 2013-04-20 NOTE — Plan of Care (Signed)
Problem: Phase II Progression Outcomes Goal: Discharge plan established Outcome: Progressing Son unable to care for patient around the clock; will require some placement for strengthening and stability.

## 2013-04-20 NOTE — Discharge Summary (Signed)
Katherleen Folkes, MD, MPH, FACS Pager: [phone]  

## 2013-04-20 NOTE — Plan of Care (Signed)
Problem: Phase II Progression Outcomes Goal: Activity goals established Outcome: Progressing Per therapy notes, pt. Will require some additional therapy to improve balance.

## 2013-04-20 NOTE — Clinical Social Work Note (Signed)
Clinical Social Worker facilitated patient discharge including contacting patient family and facility to confirm patient discharge plans.  Clinical information faxed to facility and family agreeable with plan.  CSW arranged transport to [facility] via family car.  RN to call report prior to discharge.  Clinical Social Worker will sign off for now as social work intervention is no longer needed. Please consult us again if new need arises.  Jesse Littleton Haub, LCSW [phone] 

## 2013-04-20 NOTE — Progress Notes (Signed)
Pt. Discharged to [facility] as ordered. Family transported patient to facility. Discharged packet given to family member with instructions of fall/safety precautions, and the importance of follow-up appointments.

## 2013-04-22 ENCOUNTER — Encounter: Payer: Self-pay | Admitting: Internal Medicine

## 2013-04-22 ENCOUNTER — Non-Acute Institutional Stay (SKILLED_NURSING_FACILITY): Payer: Medicare Other | Admitting: Internal Medicine

## 2013-04-22 DIAGNOSIS — I2510 Atherosclerotic heart disease of native coronary artery without angina pectoris: Secondary | ICD-10-CM

## 2013-04-22 DIAGNOSIS — I10 Essential (primary) hypertension: Secondary | ICD-10-CM

## 2013-04-22 DIAGNOSIS — C61 Malignant neoplasm of prostate: Secondary | ICD-10-CM

## 2013-04-22 DIAGNOSIS — S066X9D Traumatic subarachnoid hemorrhage with loss of consciousness of unspecified duration, subsequent encounter: Secondary | ICD-10-CM

## 2013-04-22 DIAGNOSIS — E785 Hyperlipidemia, unspecified: Secondary | ICD-10-CM

## 2013-04-22 DIAGNOSIS — S43004D Unspecified dislocation of right shoulder joint, subsequent encounter: Secondary | ICD-10-CM

## 2013-04-22 DIAGNOSIS — N400 Enlarged prostate without lower urinary tract symptoms: Secondary | ICD-10-CM

## 2013-04-22 DIAGNOSIS — Z5189 Encounter for other specified aftercare: Secondary | ICD-10-CM

## 2013-04-22 DIAGNOSIS — K219 Gastro-esophageal reflux disease without esophagitis: Secondary | ICD-10-CM

## 2013-04-22 NOTE — Assessment & Plan Note (Signed)
No current problems;pt hasn't needed any SL Nitro;no active intervention needed

## 2013-04-22 NOTE — Assessment & Plan Note (Signed)
No apparent problems at this time ;continue PPI-prilosec

## 2013-04-22 NOTE — Progress Notes (Signed)
Date: 04/22/2013  MRN:  [id number] Name:  Eric Cooley Sex:  male Age:  79 y.o. DOB:1920/05/30   PSC #:     [facility]                  Facility/Room; 119 Level Of Care: SNF Provider: Lyn Hollingshead  Emergency Contacts: Contact Information   Name Relation Home Work Mobile   Noseworthy,Julia Daughter [phone]        Code Status:Full   Allergies:No Known Allergies   Chief Complaint  Patient presents with  . Hospitalization Follow-up     HPI:pt is 79 yo who tripped at home ,dislocated his R shoulder and was found to have a subarrCHNOID HEMMORHAGE;pt was stable in hosp, hemmorhage did not enlarge and pt had a normal neuro exam; he was released to NH although he is independent at home because shoulder is in a sling and he needs closer observation    Past Medical History  Diagnosis Date  . Hypertension   . MI (myocardial infarction)   . 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 CAD (coronary artery disease) 03/29/2011  . HTN (hypertension) 03/29/2011  . Hyperlipidemia 03/29/2011  . BPH (benign prostatic hypertrophy) 03/29/2011  . GERD (gastroesophageal reflux disease) 03/29/2011  . S/P cholecystectomy 03/29/2011  . 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 Thrombocytopenia 03/29/2011  . Dizziness - giddy 03/29/2011  . Allergic rhinitis, cause unspecified 03/29/2011  . S/P partial gastrectomy 03/29/2011  . Anemia 03/29/2011  . Arthritis   . Ulcer   . Urine incontinence   . Low back pain   . DJD (degenerative joint disease), lumbar 04/01/2011  . Prostate cancer 03/29/2011    Past Surgical History  Procedure Laterality Date  . Heart stent  2010  . Tonsillectomy  1951  . Cholecystectomy  1986  . Stromal tumor       Procedures: CT head- SAH Consultants:DR Cran-Ns[facility]; Dr supple by phone- ortho    Immunization History  Administered Date(s) Administered  . Influenza Split 10/10/2011  . Influenza, Seasonal, Injecte, Preservative Fre 09/25/2012   . Pneumococcal Conjugate 03/07/2010  . Pneumococcal Polysaccharide 10/10/2011  . Tdap 04/01/2011     Diet: heart health, no fried foods, skim milk only  History  Substance Use Topics  . Smoking status: Former Games developer  . Smokeless tobacco: Never Used     Comment: Stopped smoking in 1962  . Alcohol Use: 0.6 oz/week    1 Glasses of wine per week     Comment: occasional    Family History  Problem Relation Age of Onset  . Arthritis Mother   . Cancer Mother     ovarian cancer  . Ovarian cancer Mother   . Arthritis Father   . Cancer Other     Lung cancer  . Colon cancer Neg Hx    . Patient Active Problem List   Diagnosis Date Noted  . Subarachnoid hemorrhage following injury 04/16/2013    Priority: High  . Shoulder dislocation-right 04/16/2013    Priority: High  . Intermittent low back pain 04/15/2012  . Constipation 03/04/2012  . Dysuria 03/04/2012  . Pneumonia 01/29/2012  . Dizziness 12/18/2011  . Balance disorder 12/18/2011  . Pre-ulcerative corn or callous 10/13/2011  . DJD (degenerative joint disease), lumbar 04/01/2011  . CAD (coronary artery disease) 03/29/2011  . Prostate cancer 03/29/2011  . 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 HTN (hypertension) 03/29/2011  . Hyperlipidemia 03/29/2011  . BPH (benign prostatic hypertrophy) 03/29/2011  .  GERD (gastroesophageal reflux disease) 03/29/2011  . Preventative health care 03/29/2011  . S/P cholecystectomy 03/29/2011  . Allergic rhinitis, cause unspecified 03/29/2011  . S/P partial gastrectomy 03/29/2011  . Anemia 03/29/2011    DATA OBTAINED: from patient,  GENERAL: Feels well   No fevers, fatigue, change in appetite or weight SKIN: No itch, rash or open wounds EYES: No eye pain, dryness or itching  No change in vision EARS: No earache, tinnitus, change in hearing NOSE: No congestion, drainage or bleeding MOUTH/THROAT: No mouth or tooth pain  No sore throat No difficulty chewing or  swallowing RESPIRATORY: No cough, wheezing, SOB CARDIAC: No chest pain, palpitations  No edema. CHEST/BREASTS: No discomfort, GI: No abdominal pain  No N/V/D No heartburn or reflux ;constipation is better last few days GU: No dysuria, frequency or urgency  No change in urine volume or character No nocturia or change in stream   MUSCULOSKELETAL: R shoulder doesn't hurt too much;pt is using pain meds some but he is able to have it out of the sling and move it some without too much pain;Gait is fairly steady   NEUROLOGIC: No dizziness, fainting, headache, imbalance, numbness  No change in mental status.  PSYCHIATRIC: No feelings of anxiety, depression Sleeps well.  No behavior issue.    Vital signs: BP 138/78  Pulse 80  Temp(Src) 97.6 F (36.4 C)  Resp 18  Ht 5\' 2"  (1.575 m)  Wt 130 lb (58.968 kg)  BMI 23.77 kg/m2  GENERAL APPEARANCE: No acute distress, appropriately groomed, normal body habitus. Alert, pleasant, conversant. SKIN: No diaphoresis rash, unusual lesions,healing bruise and lac R eye/eyebrow HEAD: Normocephalic, EYES: Conjunctiva/lids clear. Pupils round, reactive. EOMs intact.  EARS: External exam WNL, canals clear, TM WNL. Hearing grossly normal. NOSE: No deformity or discharge. MOUTH/THROAT: Lips w/o lesions. Oral mucosa, tongue moist, w/o lesion. Oropharynx w/o redness or lesions.  NECK: Supple, full ROM. No thyroid tenderness, enlargement or nodule RESPIRATORY: Breathing is even, unlabored. Lung sounds are clear and full.  CARDIOVASCULAR: Heart RRR. No murmur or extra heart sounds  ARTERIAL:radial and DP pulses 2+ B  VENOUS: No varicosities. No venous stasis skin changes  EDEMA: No peripheral or periorbital edema. No ascites GASTROINTESTINAL: Abdomen is soft, non-tender, not distended w/ normal bowel sounds. No  ventral or inguinal hernia GENITOURINARY: Bladder non tender, not distended. MUSCULOSKELETAL: Moves all extremities except R armwith full ROM, strength and  tone. Back is without kyphosis, scoliosis or spinal process tenderness. Gait is steady NEUROLOGIC: Oriented to time, place, person. Cranial nerves 2-12 grossly intact, speech clear, no tremor. PSYCHIATRIC: Mood and affect appropriate to situation    Functional assessment: excellent;pt can do everything for himself Areas of potential improvement:increase endurance Rehabilitation Potential:good although pt can not do much with his R arm until f/u with Dr supple Prognosis for survival:excelent CAD (coronary artery disease) No current problems;pt hasn't needed any SL Nitro;no active intervention needed  BPH (benign prostatic hypertrophy) Continue flomax  GERD (gastroesophageal reflux disease) No apparent problems at this time ;continue PPI-prilosec  Hyperlipidemia Last lipid profile 3 months ago good;continue zocor  Prostate cancer PCP following PSA;no intervention at this time  Shoulder dislocation-right Occurred when pt fell;it is in a sling and he has follow up with Dr Rennis Chris 04/29/2013 at 0845;pt has pain meds if needed  Subarachnoid hemorrhage following injury Pt tripped at home, dislocated his shoulder and sustained a hemmorhage;was seen by N'surg, Dr Cran in hosp;condition is stable and no f/u needed;pt is off ASA and NSAIDs,  suggest for 2 months

## 2013-04-22 NOTE — Assessment & Plan Note (Signed)
Continue flomax  

## 2013-04-22 NOTE — Assessment & Plan Note (Signed)
Occurred when pt fell;it is in a sling and he has follow up with Dr Rennis Chris 04/29/2013 at 0845;pt has pain meds if needed

## 2013-04-22 NOTE — Assessment & Plan Note (Signed)
Pt tripped at home, dislocated his shoulder and sustained a hemmorhage;was seen by N'surg, Dr Cran in hosp;condition is stable and no f/u needed;pt is off ASA and NSAIDs, suggest for 2 months

## 2013-04-22 NOTE — Assessment & Plan Note (Signed)
PCP following PSA;no intervention at this time

## 2013-04-22 NOTE — Assessment & Plan Note (Signed)
Last lipid profile 3 months ago good;continue zocor

## 2013-05-17 ENCOUNTER — Non-Acute Institutional Stay (SKILLED_NURSING_FACILITY): Payer: Medicare Other | Admitting: Nurse Practitioner

## 2013-05-17 ENCOUNTER — Encounter: Payer: Self-pay | Admitting: Nurse Practitioner

## 2013-05-17 DIAGNOSIS — S066X9S Traumatic subarachnoid hemorrhage with loss of consciousness of unspecified duration, sequela: Secondary | ICD-10-CM

## 2013-05-17 DIAGNOSIS — S069X9S Unspecified intracranial injury with loss of consciousness of unspecified duration, sequela: Secondary | ICD-10-CM

## 2013-05-17 DIAGNOSIS — IMO0002 Reserved for concepts with insufficient information to code with codable children: Secondary | ICD-10-CM

## 2013-05-17 DIAGNOSIS — S43004S Unspecified dislocation of right shoulder joint, sequela: Secondary | ICD-10-CM

## 2013-05-17 DIAGNOSIS — N400 Enlarged prostate without lower urinary tract symptoms: Secondary | ICD-10-CM

## 2013-05-17 DIAGNOSIS — I10 Essential (primary) hypertension: Secondary | ICD-10-CM

## 2013-05-17 NOTE — Progress Notes (Signed)
Patient ID: Eric Cooley, male   DOB: May 06, 1920, 79 y.o.   MRN: [id number]  Nursing Home Location:  [facility]   Place of Service: SNF (31)  Chief Complaint  Patient presents with  . Discharge Note    HPI:  Eric Cooley went to hospital following a fall in which he tripped and fell.  He had some amnesia and was noted to have right arm pain and a laceration to right brow line.  He was found to have a subarachnoid hemorrhage, which has been stable. He was found to have a shoulder dislocation an is following with  Dr. Rennis Chris. Pt has been at [facility] for ongoing rehab and has done very well. Basically can do all for himself but needs to cont to follow with supple for recommendations regarding right shoulder. Patient currently doing well with therapy, now stable to discharge home with home health.   Review of Systems:  DATA OBTAINED: from patient, nurse, medical record GENERAL: Feels well no fevers, fatigue, appetite changes SKIN: No itching, rash or wounds MOUTH/THROAT: No sore throat, No difficulty swallowing  RESPIRATORY: No cough, wheezing, SOB CARDIAC: No chest pain, palpitations, lower extremity edema  GI: No abdominal pain, No N/V/D or constipation, No heartburn or reflux  GU: No dysuria, frequency or urgency, or incontinence  MUSCULOSKELETAL: No unrelieved bone/joint pain NEUROLOGIC: Awake, alert, appropriate to situation, No change in mental status.  PSYCHIATRIC: No overt anxiety or sadness. Sleeps well. No behavior issue.  AMBULATION:  independently     Medications: Patient's Medications  New Prescriptions   No medications on file  Previous Medications   ACETAMINOPHEN (TYLENOL) 500 MG TABLET    Take 500 mg by mouth every 6 (six) hours as needed for pain.   ASPIRIN 81 MG EC TABLET    Take 1 tablet (81 mg total) by mouth daily.   DOCUSATE SODIUM 100 MG CAPS    Take 100 mg by mouth 2 (two) times daily.   HYDROCODONE-ACETAMINOPHEN (NORCO/VICODIN) 5-325 MG PER  TABLET    Take 1 tablet by mouth every 8 (eight) hours as needed (FOR MODERATE TO SEVERE PAIN).   LISINOPRIL (PRINIVIL,ZESTRIL) 5 MG TABLET    Take 5 mg by mouth daily.   MULTIPLE VITAMINS-MINERALS (ICAPS) CAPS    Take 1 capsule by mouth daily.    NITROSTAT 0.4 MG SL TABLET    place 1 tablet under the tongue every 5 minutes for 3 doses if needed for chest pain   OMEPRAZOLE (PRILOSEC) 20 MG CAPSULE    Take 20 mg by mouth daily.   POLYETHYLENE GLYCOL (MIRALAX / GLYCOLAX) PACKET    Take 17 g by mouth daily.   SIMVASTATIN (ZOCOR) 10 MG TABLET    Take 10 mg by mouth daily.     TAMSULOSIN HCL (FLOMAX) 0.4 MG CAPS    Take by mouth daily.    Modified Medications   No medications on file  Discontinued Medications   PANTOPRAZOLE (PROTONIX) 20 MG TABLET    Take 20 mg by mouth daily.     Physical Exam:  Filed Vitals:   05/17/13 1607  BP: 111/69  Pulse: 70  Temp: 98.3 F (36.8 C)  Resp: 20    GENERAL APPEARANCE: Alert, conversant. Appropriately groomed. No acute distress.  SKIN: No diaphoresis rash, or wounds HEAD: Normocephalic, atraumatic  EYES: Conjunctiva/lids clear. Pupils round, reactive. EOMs intact.  EARS: External exam WNL. Hearing grossly normal.  NOSE: No deformity or discharge.  MOUTH/THROAT: Lips w/o lesions.  Mouth and throat normal. Tongue moist, w/o lesion.  NECK: No thyroid tenderness, enlargement or nodule  RESPIRATORY: Breathing is even, unlabored. Lung sounds are clear   CARDIOVASCULAR: Heart RRR no murmurs, rubs or gallops. No peripheral edema.  ARTERIAL: radial pulse 2+, DP pulse 1+  GASTROINTESTINAL: Abdomen is soft, non-tender, not distended w/ normal bowel sounds. No mass, ventral or inguinal hernia. No organomegally GENITOURINARY: Bladder non tender, not distended  MUSCULOSKELETAL: limited ROM to right shoulder  NEUROLOGIC: Oriented X3. Cranial nerves 2-12 grossly intact. Moves all extremities no tremor. PSYCHIATRIC: Mood and affect appropriate to situation, no  behavioral issues  Assessment/Plan  Right shoulder dislocation Pain controlled--  Following with ortho-- pt is stable for discharge-will need therapies per home health. No DME needed. Rx written.  will need to follow up with PCP within 2 weeks. Subarachnoid hemorrhage Stable  Right eyebrow laceration Well healed BPH stable with medications Hypertension Stable on current medications

## 2013-05-28 ENCOUNTER — Ambulatory Visit (INDEPENDENT_AMBULATORY_CARE_PROVIDER_SITE_OTHER): Payer: Medicare Other | Admitting: Internal Medicine

## 2013-05-28 VITALS — BP 124/70 | HR 85 | Temp 98.2°F | Ht 68.0 in | Wt 129.4 lb

## 2013-05-28 DIAGNOSIS — I10 Essential (primary) hypertension: Secondary | ICD-10-CM

## 2013-05-28 DIAGNOSIS — R410 Disorientation, unspecified: Secondary | ICD-10-CM

## 2013-05-28 DIAGNOSIS — Z5189 Encounter for other specified aftercare: Secondary | ICD-10-CM

## 2013-05-28 DIAGNOSIS — S43004D Unspecified dislocation of right shoulder joint, subsequent encounter: Secondary | ICD-10-CM

## 2013-05-28 DIAGNOSIS — R269 Unspecified abnormalities of gait and mobility: Secondary | ICD-10-CM | POA: Insufficient documentation

## 2013-05-28 NOTE — Patient Instructions (Signed)
Please continue all other medications as before, and refills have been done if requested. Please have the pharmacy call with any other refills you may need. No further lab work needed today You will be contacted regarding the referral for: Neurology, Home Health, and [facility]) You should also see about [facility] for your son, but would need referred per the PCP Please keep your appointments with your specialists as you have planned, and Physical Therapy  Please return in 2 months, or sooner if needed

## 2013-05-28 NOTE — Progress Notes (Signed)
Subjective:    Patient ID: Eric Cooley, male    DOB: [birth year], 79 y.o.   MRN: [id number]  HPI  Here after fall outside on broken concrete driveway, hit head with SAH, right shoulder injury, was 4 days hospd, with d/c with recommendation for f/u with orthopedic, dr supple; saw last Monday, for PT soon.  Daughter is psychiatrist here (lives in [place]) , had noticed even prior to fall with ? visuo-spatial changes and some confusion post head trauma though no n/v, further HA.  sister with dementia. Pt ? More impulsive as well;  Daughter with request for neurology. Pt Spent some time at [facility] rehab, daughter thinks will need home nurse eval and possible further PT.  Tends to shuffle more with walking since been at home.  No further falls.  Pt Lives with son with crohns and schizophrenia, and another daughter who lives here locally. Currently not involved with [facility] locally Past Medical History  Diagnosis Date  . Hypertension   . MI (myocardial infarction)   . 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 CAD (coronary artery disease) 03/29/2011  . HTN (hypertension) 03/29/2011  . Hyperlipidemia 03/29/2011  . BPH (benign prostatic hypertrophy) 03/29/2011  . GERD (gastroesophageal reflux disease) 03/29/2011  . S/P cholecystectomy 03/29/2011  . 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 Thrombocytopenia 03/29/2011  . Dizziness - giddy 03/29/2011  . Allergic rhinitis, cause unspecified 03/29/2011  . S/P partial gastrectomy 03/29/2011  . Anemia 03/29/2011  . Arthritis   . Ulcer   . Urine incontinence   . Low back pain   . DJD (degenerative joint disease), lumbar 04/01/2011  . Prostate cancer 03/29/2011   Past Surgical History  Procedure Laterality Date  . Heart stent  2010  . Tonsillectomy  1951  . Cholecystectomy  1986  . Stromal tumor      reports that he has quit smoking. He has never used smokeless tobacco. He reports that he drinks about 0.6 ounces of alcohol  per week. He reports that he does not use illicit drugs. family history includes Arthritis in his father and mother; Cancer in his mother and other; Ovarian cancer in his mother. There is no history of Colon cancer. No Known Allergies Current Outpatient Prescriptions on File Prior to Visit  Medication Sig Dispense Refill  . acetaminophen (TYLENOL) 500 MG tablet Take 500 mg by mouth every 6 (six) hours as needed for pain.      Marland Kitchen aspirin 81 MG EC tablet Take 1 tablet (81 mg total) by mouth daily.  30 tablet  12  . docusate sodium 100 MG CAPS Take 100 mg by mouth 2 (two) times daily.  10 capsule  0  . HYDROcodone-acetaminophen (NORCO/VICODIN) 5-325 MG per tablet Take 1 tablet by mouth every 8 (eight) hours as needed (FOR MODERATE TO SEVERE PAIN).  30 tablet  0  . lisinopril (PRINIVIL,ZESTRIL) 5 MG tablet Take 5 mg by mouth daily.      . Multiple Vitamins-Minerals (ICAPS) CAPS Take 1 capsule by mouth daily.       Marland Kitchen NITROSTAT 0.4 MG SL tablet place 1 tablet under the tongue every 5 minutes for 3 doses if needed for chest pain  25 tablet  2  . omeprazole (PRILOSEC) 20 MG capsule Take 20 mg by mouth daily.      . polyethylene glycol (MIRALAX / GLYCOLAX) packet Take 17 g by mouth daily.      . simvastatin (ZOCOR) 10 MG tablet Take  10 mg by mouth daily.        . Tamsulosin HCl (FLOMAX) 0.4 MG CAPS Take by mouth daily.        . [DISCONTINUED] fluticasone (FLONASE) 50 MCG/ACT nasal spray Place 2 sprays into the nose daily.         No current facility-administered medications on file prior to visit.    Review of Systems  Constitutional: Negative for unexpected weight change, or unusual diaphoresis  HENT: Negative for tinnitus.   Eyes: Negative for photophobia and visual disturbance.  Respiratory: Negative for choking and stridor.   Gastrointestinal: Negative for vomiting and blood in stool.  Genitourinary: Negative for hematuria and decreased urine volume.  Musculoskeletal: Negative for acute joint  swelling Skin: Negative for color change and wound.  Neurological: Negative for tremors and numbness other than noted  Psychiatric/Behavioral: Negative for decreased concentration or  hyperactivity.       Objective:   Physical Exam BP 124/70  Pulse 85  Temp(Src) 98.2 F (36.8 C) (Oral)  Ht 5\' 8"  (1.727 m)  Wt 129 lb 6 oz (58.684 kg)  BMI 19.68 kg/m2  SpO2 96% VS noted, not ill appearing, but frail with gen;d weakness Constitutional: Pt appears well-developed and well-nourished.  HENT: Head: NCAT.  Right Ear: External ear normal.  Left Ear: External ear normal.  Eyes: Conjunctivae and EOM are normal. Pupils are equal, round, and reactive to light.  Neck: Normal range of motion. Neck supple.  Cardiovascular: Normal rate and regular rhythm.   Pulmonary/Chest: Effort normal and breath sounds normal.  Abd:  Soft, NT, non-distended, + BS Neurological: Pt is alert. ? Mild confusion, somewhat stopped posture with short steps with gait, some unsteady Skin: Skin is warm. No erythema. No LE edema  Psychiatric: Pt behavior is normal. Not depressed affect.     Assessment & Plan:

## 2013-05-29 ENCOUNTER — Encounter: Payer: Self-pay | Admitting: Internal Medicine

## 2013-05-29 NOTE — Assessment & Plan Note (Signed)
stable overall by history and exam, recent data reviewed with pt, and pt to continue medical treatment as before,  to f/u any worsening symptoms or concerns BP Readings from Last 3 Encounters:  05/28/13 124/70  05/17/13 111/69  04/22/13 138/78

## 2013-05-29 NOTE — Assessment & Plan Note (Signed)
Arm still in sling, had recent ortho f/u,  to f/u any worsening symptoms or concerns

## 2013-05-29 NOTE — Assessment & Plan Note (Signed)
?   clincial significance, to cont PT, and refer neurology

## 2013-05-29 NOTE — Assessment & Plan Note (Signed)
Mild, ? Post concusssive, but also with recent [employer], also for neuro as per daughter request

## 2013-06-04 ENCOUNTER — Encounter: Payer: Self-pay | Admitting: Internal Medicine

## 2013-06-13 ENCOUNTER — Encounter: Payer: Self-pay | Admitting: *Deleted

## 2013-06-17 ENCOUNTER — Ambulatory Visit: Payer: Medicare Other | Admitting: Internal Medicine

## 2013-06-25 ENCOUNTER — Ambulatory Visit (INDEPENDENT_AMBULATORY_CARE_PROVIDER_SITE_OTHER): Payer: Medicare Other | Admitting: Internal Medicine

## 2013-06-25 ENCOUNTER — Encounter: Payer: Self-pay | Admitting: Internal Medicine

## 2013-06-25 VITALS — BP 112/76 | HR 57 | Ht 68.0 in | Wt 125.8 lb

## 2013-06-25 DIAGNOSIS — I10 Essential (primary) hypertension: Secondary | ICD-10-CM

## 2013-06-25 DIAGNOSIS — E785 Hyperlipidemia, unspecified: Secondary | ICD-10-CM

## 2013-06-25 DIAGNOSIS — I2510 Atherosclerotic heart disease of native coronary artery without angina pectoris: Secondary | ICD-10-CM

## 2013-06-25 MED ORDER — PANTOPRAZOLE SODIUM 20 MG PO TBEC: 20.0000 mg | DELAYED_RELEASE_TABLET | Freq: Every day | ORAL | Status: DC

## 2013-06-25 MED ORDER — SIMVASTATIN 10 MG PO TABS: 10.0000 mg | ORAL_TABLET | Freq: Every day | ORAL | Status: DC

## 2013-06-25 NOTE — Progress Notes (Signed)
OFFICE NOTE  Chief Complaint:  Ran out of cholesterol medication  Primary Care Physician: Oliver Barre, MD  HPI:  DAIVON RAYOS is a 79 year old gentleman previously followed by Dr. Lynnea Ferrier with history of coronary artery disease status post 2 Xience stents to the LAD in 2010. EF was 55%. Unfortunately, he had a malignant GI stromal tumor and had partial gastrectomy in 2011. Otherwise, he is doing fairly well. He returns today with no specific complaints. Recently, he had an episode of dizziness and underwent CT and MRI of the head as well as MRA, both of which showed some chronic atrophy but no significant changes. This makes me wonder whether he is having some early dementia.  In July, he had a fall and struck his head. He had a CT scan which showed a small bleed. In some of his notes it was noted that he tripped and felt, but today he reported that he passed out. It is unclear what the true story was. He says his never done this before.  He denies any chest pain or shortness of breath.   PMHx:  Past Medical History  Diagnosis Date  . Hypertension   . MI (myocardial infarction)   . 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 CAD (coronary artery disease) 03/29/2011  . HTN (hypertension) 03/29/2011  . Hyperlipidemia 03/29/2011  . BPH (benign prostatic hypertrophy) 03/29/2011  . GERD (gastroesophageal reflux disease) 03/29/2011  . S/P cholecystectomy 03/29/2011  . 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 Thrombocytopenia 03/29/2011  . Dizziness - giddy 03/29/2011  . Allergic rhinitis, cause unspecified 03/29/2011  . S/P partial gastrectomy 03/29/2011  . Anemia 03/29/2011  . Arthritis   . Ulcer   . Urine incontinence   . Low back pain   . DJD (degenerative joint disease), lumbar 04/01/2011  . Prostate cancer 03/29/2011  . History of nuclear stress test 03/27/2010    exercise; no ischemic changes, normal study     Past Surgical History  Procedure  Laterality Date  . Coronary angioplasty with stent placement  04/24/2009    2 Xience DES to LAD 3.5x64mm and 3.0x22mm overlapping  . Tonsillectomy  1951  . Cholecystectomy  1986  . 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 Transthoracic echocardiogram  10/09/2009    EF 55-60%; normaml LV systolic function with grade 1 diastolic dysfunction    FAMHx:  Family History  Problem Relation Age of Onset  . Arthritis Mother   . Ovarian cancer Mother   . Heart failure Mother   . Arthritis Father   . Lung cancer Father   . Colon cancer Neg Hx     SOCHx:   reports that he quit smoking about 52 years ago. He has never used smokeless tobacco. He reports that he drinks about 0.6 ounces of alcohol per week. He reports that he does not use illicit drugs.  ALLERGIES:  No Known Allergies  ROS: A comprehensive review of systems was negative.  HOME MEDS: Current Outpatient Prescriptions  Medication Sig Dispense Refill  . acetaminophen (TYLENOL) 500 MG tablet Take 500 mg by mouth every 6 (six) hours as needed for pain.      Marland Kitchen aspirin 81 MG EC tablet Take 1 tablet (81 mg total) by mouth daily.  30 tablet  12  . docusate sodium 100 MG CAPS Take 100 mg by mouth 2 (two) times daily.  10 capsule  0  . lisinopril (PRINIVIL,ZESTRIL) 5  MG tablet Take 5 mg by mouth daily.      . Multiple Vitamins-Minerals (ICAPS) CAPS Take 1 capsule by mouth daily.       Marland Kitchen NITROSTAT 0.4 MG SL tablet place 1 tablet under the tongue every 5 minutes for 3 doses if needed for chest pain  25 tablet  2  . pantoprazole (PROTONIX) 20 MG tablet Take 1 tablet (20 mg total) by mouth daily.  30 tablet  11  . polyethylene glycol (MIRALAX / GLYCOLAX) packet Take 17 g by mouth daily.      . simvastatin (ZOCOR) 10 MG tablet Take 1 tablet (10 mg total) by mouth daily.  30 tablet  11  . Tamsulosin HCl (FLOMAX) 0.4 MG CAPS Take by mouth daily.        . [DISCONTINUED] fluticasone (FLONASE) 50 MCG/ACT nasal spray Place 2  sprays into the nose daily.         No current facility-administered medications for this visit.    LABS/IMAGING: No results found for this or any previous visit (from the past 48 hour(s)). No results found.  VITALS: BP 112/76  Pulse 57  Ht 5\' 8"  (1.727 m)  Wt 125 lb 12.8 oz (57.063 kg)  BMI 19.13 kg/m2  EXAM: General appearance: alert and no distress Neck: no adenopathy, no carotid bruit, no JVD, supple, symmetrical, trachea midline and thyroid not enlarged, symmetric, no tenderness/mass/nodules Lungs: clear to auscultation bilaterally Heart: regular rate and rhythm, S1, S2 normal, no murmur, click, rub or gallop Abdomen: soft, non-tender; bowel sounds normal; no masses,  no organomegaly Extremities: extremities normal, atraumatic, no cyanosis or edema Pulses: 2+ and symmetric Skin: Skin color, texture, turgor normal. No rashes or lesions Neurologic: Grossly normal  EKG: Sinus bradycardia 57  ASSESSMENT: 1. Coronary artery disease status post PCI x2 drug-eluting stents in 2010 to the proximal LAD 2. Hypertension at goal 3. Dyslipidemia  PLAN: 1.   Mr. Jupin is doing well. He recently ran out of his cholesterol medicine but has been taking it up until a few days ago. I would like to go ahead and recheck his cholesterol and will refill his simvastatin. Otherwise no changes to his medicine we'll see him back annually.  Chrystie Nose, MD, [facility] Attending Cardiologist [facility]  HILTY,Kenneth C 06/25/2013, 3:52 PM

## 2013-06-25 NOTE — Patient Instructions (Addendum)
Your physician wants you to follow-up in: 1 year. You will receive a reminder letter in the mail two months in advance. If you don't receive a letter, please call our office to schedule the follow-up appointment.  Your physician recommends that you return for lab work in a few weeks. You will need to be fasting - nothing to eat/drink after midnight.

## 2013-06-30 ENCOUNTER — Other Ambulatory Visit: Payer: Self-pay | Admitting: *Deleted

## 2013-06-30 MED ORDER — LISINOPRIL 5 MG PO TABS: 5.0000 mg | ORAL_TABLET | Freq: Every day | ORAL | Status: DC

## 2013-07-07 LAB — NMR LIPOPROFILE WITH LIPIDS
HDL Particle Number: 46.8 umol/L (ref >=30.5)
LDL (calc): 78 mg/dL (ref <100)
LDL Size: 21.1 nm (ref >20.5)
Large HDL-P: >20 umol/L (ref >=4.8)
Small LDL Particle Number: <90 nmol/L (ref <=527)

## 2013-07-08 ENCOUNTER — Encounter: Payer: Self-pay | Admitting: *Deleted

## 2013-07-11 ENCOUNTER — Observation Stay (HOSPITAL_COMMUNITY)
Admission: EM | Admit: 2013-07-11 | Discharge: 2013-07-13 | Disposition: A | Discharge status: Home / Self Care | Payer: Medicare Other | Attending: Internal Medicine | Admitting: Internal Medicine

## 2013-07-11 ENCOUNTER — Emergency Department (HOSPITAL_COMMUNITY): Payer: Medicare Other

## 2013-07-11 ENCOUNTER — Encounter (HOSPITAL_COMMUNITY): Payer: Self-pay | Admitting: *Deleted

## 2013-07-11 DIAGNOSIS — K5190 Ulcerative colitis, unspecified, without complications: Secondary | ICD-10-CM | POA: Insufficient documentation

## 2013-07-11 DIAGNOSIS — D696 Thrombocytopenia, unspecified: Secondary | ICD-10-CM | POA: Insufficient documentation

## 2013-07-11 DIAGNOSIS — K219 Gastro-esophageal reflux disease without esophagitis: Secondary | ICD-10-CM | POA: Insufficient documentation

## 2013-07-11 DIAGNOSIS — J309 Allergic rhinitis, unspecified: Secondary | ICD-10-CM | POA: Insufficient documentation

## 2013-07-11 DIAGNOSIS — I10 Essential (primary) hypertension: Secondary | ICD-10-CM

## 2013-07-11 DIAGNOSIS — R079 Chest pain, unspecified: Principal | ICD-10-CM | POA: Insufficient documentation

## 2013-07-11 DIAGNOSIS — Z87891 Personal history of nicotine dependence: Secondary | ICD-10-CM | POA: Insufficient documentation

## 2013-07-11 DIAGNOSIS — M47816 Spondylosis without myelopathy or radiculopathy, lumbar region: Secondary | ICD-10-CM

## 2013-07-11 DIAGNOSIS — N400 Enlarged prostate without lower urinary tract symptoms: Secondary | ICD-10-CM | POA: Insufficient documentation

## 2013-07-11 DIAGNOSIS — I2510 Atherosclerotic heart disease of native coronary artery without angina pectoris: Secondary | ICD-10-CM

## 2013-07-11 DIAGNOSIS — R300 Dysuria: Secondary | ICD-10-CM

## 2013-07-11 DIAGNOSIS — Z9049 Acquired absence of other specified parts of digestive tract: Secondary | ICD-10-CM

## 2013-07-11 DIAGNOSIS — R42 Dizziness and giddiness: Secondary | ICD-10-CM | POA: Insufficient documentation

## 2013-07-11 DIAGNOSIS — K5900 Constipation, unspecified: Secondary | ICD-10-CM

## 2013-07-11 DIAGNOSIS — E43 Unspecified severe protein-calorie malnutrition: Secondary | ICD-10-CM

## 2013-07-11 DIAGNOSIS — M545 Low back pain: Secondary | ICD-10-CM

## 2013-07-11 DIAGNOSIS — R269 Unspecified abnormalities of gait and mobility: Secondary | ICD-10-CM

## 2013-07-11 DIAGNOSIS — Z9889 Other specified postprocedural states: Secondary | ICD-10-CM | POA: Insufficient documentation

## 2013-07-11 DIAGNOSIS — L84 Corns and callosities: Secondary | ICD-10-CM

## 2013-07-11 DIAGNOSIS — R5381 Other malaise: Secondary | ICD-10-CM | POA: Insufficient documentation

## 2013-07-11 DIAGNOSIS — J189 Pneumonia, unspecified organism: Secondary | ICD-10-CM

## 2013-07-11 DIAGNOSIS — Z0000 Encounter for general adult medical examination without abnormal findings: Secondary | ICD-10-CM

## 2013-07-11 DIAGNOSIS — Z903 Acquired absence of stomach [part of]: Secondary | ICD-10-CM

## 2013-07-11 DIAGNOSIS — Z86018 Personal history of other benign neoplasm: Secondary | ICD-10-CM

## 2013-07-11 DIAGNOSIS — C61 Malignant neoplasm of prostate: Secondary | ICD-10-CM

## 2013-07-11 DIAGNOSIS — R5383 Other fatigue: Secondary | ICD-10-CM

## 2013-07-11 DIAGNOSIS — D649 Anemia, unspecified: Secondary | ICD-10-CM | POA: Insufficient documentation

## 2013-07-11 DIAGNOSIS — I219 Acute myocardial infarction, unspecified: Secondary | ICD-10-CM | POA: Insufficient documentation

## 2013-07-11 DIAGNOSIS — E785 Hyperlipidemia, unspecified: Secondary | ICD-10-CM

## 2013-07-11 DIAGNOSIS — Z79899 Other long term (current) drug therapy: Secondary | ICD-10-CM | POA: Insufficient documentation

## 2013-07-11 DIAGNOSIS — Z7982 Long term (current) use of aspirin: Secondary | ICD-10-CM | POA: Insufficient documentation

## 2013-07-11 LAB — BASIC METABOLIC PANEL
BUN: 24 mg/dL — ABNORMAL HIGH (ref 6–23)
Creatinine, Ser: 1 mg/dL (ref 0.50–1.35)
GFR calc Af Amer: 74 mL/min — ABNORMAL LOW (ref >90)
GFR calc non Af Amer: 64 mL/min — ABNORMAL LOW (ref >90)

## 2013-07-11 LAB — CBC
HCT: 41.4 % (ref 39.0–52.0)
MCH: 34.9 pg — ABNORMAL HIGH (ref 26.0–34.0)
MCHC: 35.7 g/dL (ref 30.0–36.0)
MCV: 97.6 fL (ref 78.0–100.0)
RDW: 14.2 % (ref 11.5–15.5)

## 2013-07-11 LAB — LACTIC ACID, PLASMA: Lactic Acid, Venous: 1.3 mmol/L (ref 0.5–2.2)

## 2013-07-11 NOTE — ED Provider Notes (Signed)
CSN: [id number]     Arrival date & time 07/11/13  2022 History   First MD Initiated Contact with Patient 07/11/13 2036     Chief Complaint  Patient presents with  . Chest Pain   (Consider location/radiation/quality/duration/timing/severity/associated sxs/prior Treatment) Patient is a 79 y.o. male presenting with chest pain. The history is provided by the patient.  Chest Pain Pain location:  L chest Pain quality: aching   Pain radiates to:  Does not radiate Pain radiates to the back: no   Pain severity:  Mild Onset quality:  Gradual Timing:  Intermittent Progression:  Unchanged Chronicity:  New Context: at rest   Context: not breathing, no drug use and not eating   Relieved by:  Nothing Worsened by:  Nothing tried Ineffective treatments:  None tried Associated symptoms: no abdominal pain, no anxiety, no back pain, no cough, no fever, no shortness of breath and not vomiting     Past Medical History  Diagnosis Date  . Hypertension   . MI (myocardial infarction)   . 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 CAD (coronary artery disease) 03/29/2011  . HTN (hypertension) 03/29/2011  . Hyperlipidemia 03/29/2011  . BPH (benign prostatic hypertrophy) 03/29/2011  . GERD (gastroesophageal reflux disease) 03/29/2011  . S/P cholecystectomy 03/29/2011  . 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 Thrombocytopenia 03/29/2011  . Dizziness - giddy 03/29/2011  . Allergic rhinitis, cause unspecified 03/29/2011  . S/P partial gastrectomy 03/29/2011  . Anemia 03/29/2011  . Arthritis   . Ulcer   . Urine incontinence   . Low back pain   . DJD (degenerative joint disease), lumbar 04/01/2011  . Prostate cancer 03/29/2011  . History of nuclear stress test 03/27/2010    exercise; no ischemic changes, normal study    Past Surgical History  Procedure Laterality Date  . Coronary angioplasty with stent placement  04/24/2009    2 Xience DES to LAD 3.5x71mm and 3.0x48mm  overlapping  . Tonsillectomy  1951  . Cholecystectomy  1986  . 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 Transthoracic echocardiogram  10/09/2009    EF 55-60%; normaml LV systolic function with grade 1 diastolic dysfunction   Family History  Problem Relation Age of Onset  . Arthritis Mother   . Ovarian cancer Mother   . Heart failure Mother   . Arthritis Father   . Lung cancer Father   . Colon cancer Neg Hx    History  Substance Use Topics  . Smoking status: Former Smoker    Quit date: 10/07/1960  . Smokeless tobacco: Never Used     Comment: Stopped smoking in 1962  . Alcohol Use: 0.6 oz/week    1 Glasses of wine per week     Comment: occasional    Review of Systems  Constitutional: Negative for fever.  Respiratory: Negative for cough and shortness of breath.   Cardiovascular: Positive for chest pain.  Gastrointestinal: Negative for vomiting and abdominal pain.  Musculoskeletal: Negative for back pain.  All other systems reviewed and are negative.    Allergies  Review of patient's allergies indicates no known allergies.  Home Medications   Current Outpatient Rx  Name  Route  Sig  Dispense  Refill  . acetaminophen (TYLENOL) 500 MG tablet   Oral   Take 500 mg by mouth every 6 (six) hours as needed for pain.         Marland Kitchen aspirin 81 MG EC tablet  Oral   Take 1 tablet (81 mg total) by mouth daily.   30 tablet   12     May resume in 2  weeks   . docusate sodium 100 MG CAPS   Oral   Take 100 mg by mouth 2 (two) times daily.   10 capsule   0   . lisinopril (PRINIVIL,ZESTRIL) 5 MG tablet   Oral   Take 1 tablet (5 mg total) by mouth daily.   30 tablet   5   . Multiple Vitamins-Minerals (ICAPS) CAPS   Oral   Take 1 capsule by mouth daily.          Marland Kitchen NITROSTAT 0.4 MG SL tablet      place 1 tablet under the tongue every 5 minutes for 3 doses if needed for chest pain   25 tablet   2   . pantoprazole (PROTONIX) 20 MG tablet   Oral    Take 1 tablet (20 mg total) by mouth daily.   30 tablet   11   . polyethylene glycol (MIRALAX / GLYCOLAX) packet   Oral   Take 17 g by mouth daily.         . simvastatin (ZOCOR) 10 MG tablet   Oral   Take 1 tablet (10 mg total) by mouth daily.   30 tablet   11   . Tamsulosin HCl (FLOMAX) 0.4 MG CAPS   Oral   Take by mouth daily.            BP 151/65  Pulse 100  Temp(Src) 97.9 F (36.6 C) (Oral)  Resp 20  SpO2 98% Physical Exam  Nursing note and vitals reviewed. Constitutional: He is oriented to person, place, and time. He appears well-developed and well-nourished. No distress.  HENT:  Head: Normocephalic and atraumatic.  Mouth/Throat: No oropharyngeal exudate.  Eyes: EOM are normal. Pupils are equal, round, and reactive to light.  Neck: Normal range of motion. Neck supple.  Cardiovascular: Normal rate and regular rhythm.  Exam reveals no friction rub.   No murmur heard. Pulmonary/Chest: Effort normal and breath sounds normal. No respiratory distress. He has no wheezes. He has no rales.  Abdominal: He exhibits no distension. There is no tenderness. There is no rebound.  Musculoskeletal: Normal range of motion. He exhibits no edema.  Neurological: He is alert and oriented to person, place, and time.  Skin: He is not diaphoretic.    ED Course  Procedures (including critical care time) Labs Review Labs Reviewed  CBC - Abnormal; Notable for the following:    MCH 34.9 (*)    All other components within normal limits  BASIC METABOLIC PANEL - Abnormal; Notable for the following:    Glucose, Bld 116 (*)    BUN 24 (*)    GFR calc non Af Amer 64 (*)    GFR calc Af Amer 74 (*)    All other components within normal limits  URINALYSIS, ROUTINE W REFLEX MICROSCOPIC  LACTIC ACID, PLASMA  POCT I-STAT TROPONIN I   Imaging Review Dg Chest 2 View  07/11/2013   CLINICAL DATA:  Chest pain  EXAM: CHEST  2 VIEW  COMPARISON:  01/29/2012  FINDINGS: Cardiac shadow is within  normal limits. The lungs are clear bilaterally. No sizable effusion are pneumothorax is noted. No acute bony abnormality seen.  IMPRESSION: No acute abnormality noted.   Electronically Signed   By: Alcide Clever M.D.   On: 07/11/2013 21:42     Date:  07/11/2013  Rate: 111  Rhythm: sinus tachycardia and premature atrial contractions (PAC)  QRS Axis: normal  Intervals: normal  ST/T Wave abnormalities: normal  Conduction Disutrbances:none  Narrative Interpretation:   Old EKG Reviewed: changes noted and PACs not on previous    MDM   1. Chest pain   2. Fatigue    79 year old male with history of hypertension, coronary artery disease with MI, hyper lipidemia presents with chest pain. Patient has had pain all over his body but moving his left chest recently. Denies any shortness of breath, cough, fever. Felt better this morning is feeling better here. States left chest pain is new for him. Last cath was about 3 years ago with stent placement at that time. Here vitals showed sinus tachycardia intermittently up to the 110s. He has normal blood pressures and no hypoxia. He is satting in the high 90s with good waveform. Lungs are clear. Belly is benign. Neuro exam is normal. Will check basic labs, chest x-ray, urine studies. Initial troponin normal. CXR normal. Admitted for ACS r/o.     Dagmar Hait, MD 07/11/13 [phone]

## 2013-07-11 NOTE — ED Notes (Signed)
[place], MD at bedside. 

## 2013-07-11 NOTE — ED Notes (Signed)
Pt states he is having pain all over his body and tonight noticed chest pain that started in his upper abd, and moved into his left chest

## 2013-07-12 ENCOUNTER — Encounter (HOSPITAL_COMMUNITY): Payer: Self-pay | Admitting: *Deleted

## 2013-07-12 ENCOUNTER — Observation Stay (HOSPITAL_COMMUNITY): Payer: Medicare Other

## 2013-07-12 DIAGNOSIS — R079 Chest pain, unspecified: Secondary | ICD-10-CM | POA: Diagnosis present

## 2013-07-12 DIAGNOSIS — E43 Unspecified severe protein-calorie malnutrition: Secondary | ICD-10-CM | POA: Insufficient documentation

## 2013-07-12 DIAGNOSIS — I2510 Atherosclerotic heart disease of native coronary artery without angina pectoris: Secondary | ICD-10-CM

## 2013-07-12 LAB — URINE MICROSCOPIC-ADD ON

## 2013-07-12 LAB — URINALYSIS, ROUTINE W REFLEX MICROSCOPIC
Glucose, UA: NEGATIVE mg/dL
Ketones, ur: NEGATIVE mg/dL
Protein, ur: NEGATIVE mg/dL

## 2013-07-12 LAB — TROPONIN I
Troponin I: <0.3 ng/mL (ref <0.30)
Troponin I: <0.3 ng/mL (ref <0.30)

## 2013-07-12 MED ORDER — DOCUSATE SODIUM 100 MG PO CAPS
100.0000 mg | ORAL_CAPSULE | Freq: Two times a day (BID) | ORAL | Status: DC
  Administered 2013-07-12: 100 mg via ORAL
  Filled 2013-07-12 (×4): qty 1

## 2013-07-12 MED ORDER — ASPIRIN 81 MG PO CHEW
81.0000 mg | CHEWABLE_TABLET | Freq: Every day | ORAL | Status: DC
  Administered 2013-07-12 (×2): 81 mg via ORAL
  Filled 2013-07-12 (×2): qty 1

## 2013-07-12 MED ORDER — ICAPS PO CAPS: 1.0000 | ORAL_CAPSULE | Freq: Every day | ORAL | Status: DC

## 2013-07-12 MED ORDER — ACETAMINOPHEN 325 MG PO TABS
650.0000 mg | ORAL_TABLET | ORAL | Status: DC | PRN
  Administered 2013-07-12: 650 mg via ORAL
  Filled 2013-07-12: qty 2

## 2013-07-12 MED ORDER — SIMVASTATIN 10 MG PO TABS
10.0000 mg | ORAL_TABLET | Freq: Every day | ORAL | Status: DC
  Administered 2013-07-12 (×2): 10 mg via ORAL
  Filled 2013-07-12 (×3): qty 1

## 2013-07-12 MED ORDER — ACETAMINOPHEN 500 MG PO TABS: 500.0000 mg | ORAL_TABLET | Freq: Four times a day (QID) | ORAL | Status: DC | PRN

## 2013-07-12 MED ORDER — TECHNETIUM TO 99M ALBUMIN AGGREGATED
6.0000 | Freq: Once | INTRAVENOUS | Status: AC | PRN
  Administered 2013-07-12: 6 via INTRAVENOUS

## 2013-07-12 MED ORDER — OCUVITE-LUTEIN PO CAPS
1.0000 | ORAL_CAPSULE | Freq: Every day | ORAL | Status: DC
  Administered 2013-07-12 (×2): 1 via ORAL
  Filled 2013-07-12 (×3): qty 1

## 2013-07-12 MED ORDER — ENSURE COMPLETE PO LIQD
237.0000 mL | Freq: Two times a day (BID) | ORAL | Status: DC
  Administered 2013-07-12: 237 mL via ORAL

## 2013-07-12 MED ORDER — ASPIRIN 81 MG PO TBEC: 81.0000 mg | DELAYED_RELEASE_TABLET | Freq: Every day | ORAL | Status: DC

## 2013-07-12 MED ORDER — HEPARIN SODIUM (PORCINE) 5000 UNIT/ML IJ SOLN
5000.0000 [IU] | Freq: Three times a day (TID) | INTRAMUSCULAR | Status: DC
  Administered 2013-07-12 – 2013-07-13 (×4): 5000 [IU] via SUBCUTANEOUS
  Filled 2013-07-12 (×7): qty 1

## 2013-07-12 MED ORDER — PANTOPRAZOLE SODIUM 20 MG PO TBEC
20.0000 mg | DELAYED_RELEASE_TABLET | Freq: Every day | ORAL | Status: DC
  Administered 2013-07-12 (×2): 20 mg via ORAL
  Filled 2013-07-12 (×3): qty 1

## 2013-07-12 MED ORDER — POLYETHYLENE GLYCOL 3350 17 G PO PACK
17.0000 g | PACK | Freq: Every day | ORAL | Status: DC | PRN
  Filled 2013-07-12: qty 1

## 2013-07-12 MED ORDER — LISINOPRIL 5 MG PO TABS
5.0000 mg | ORAL_TABLET | Freq: Every day | ORAL | Status: DC
  Administered 2013-07-12 (×2): 5 mg via ORAL
  Filled 2013-07-12 (×3): qty 1

## 2013-07-12 MED ORDER — TECHNETIUM TC 99M DIETHYLENETRIAME-PENTAACETIC ACID: 40.0000 | Freq: Once | INTRAVENOUS | Status: AC | PRN

## 2013-07-12 MED ORDER — ONDANSETRON HCL 4 MG/2ML IJ SOLN: 4.0000 mg | Freq: Four times a day (QID) | INTRAMUSCULAR | Status: DC | PRN

## 2013-07-12 NOTE — H&P (Signed)
[facility] History and Physical  NAHZIR POHLE BJY:[id number] DOB: [birth year] DOA: 07/11/2013  Referring physician: ED PCP: Oliver Barre, MD   Chief Complaint: Chest pain  HPI: Eric Cooley is a 79 y.o. male who presents to the ED after developing chest pain.  Symptoms onset, earlier this evening but have been improving since onset without intervention.  By the time he got to the ED, he states his chest pain has completely resolved.  He did not get a chance to take any NTG at home (misplaced his NTG bottle since he never used it).  Chest pain is located on the left side of his chest, radiates to L shoulder and back as well as LUQ abdomen.  Patient's last Cath was 3 years ago or so, he did have a stent put in at that time.  Hospitalist asked to admit as r/o ACS.  Review of Systems: 12 systems reviewed and otherwise negative.  Past Medical History  Diagnosis Date  . Hypertension   . MI (myocardial infarction)   . 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 CAD (coronary artery disease) 03/29/2011  . HTN (hypertension) 03/29/2011  . Hyperlipidemia 03/29/2011  . BPH (benign prostatic hypertrophy) 03/29/2011  . GERD (gastroesophageal reflux disease) 03/29/2011  . S/P cholecystectomy 03/29/2011  . 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 Thrombocytopenia 03/29/2011  . Dizziness - giddy 03/29/2011  . Allergic rhinitis, cause unspecified 03/29/2011  . S/P partial gastrectomy 03/29/2011  . Anemia 03/29/2011  . Arthritis   . Ulcer   . Urine incontinence   . Low back pain   . DJD (degenerative joint disease), lumbar 04/01/2011  . Prostate cancer 03/29/2011  . History of nuclear stress test 03/27/2010    exercise; no ischemic changes, normal study    Past Surgical History  Procedure Laterality Date  . Coronary angioplasty with stent placement  04/24/2009    2 Xience DES to LAD 3.5x32mm and 3.0x22mm overlapping  . Tonsillectomy  1951  . Cholecystectomy   1986  . 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 Transthoracic echocardiogram  10/09/2009    EF 55-60%; normaml LV systolic function with grade 1 diastolic dysfunction   Social History:  reports that he quit smoking about 52 years ago. He has never used smokeless tobacco. He reports that he drinks about 0.6 ounces of alcohol per week. He reports that he does not use illicit drugs.   No Known Allergies  Family History  Problem Relation Age of Onset  . Arthritis Mother   . Ovarian cancer Mother   . Heart failure Mother   . Arthritis Father   . Lung cancer Father   . Colon cancer Neg Hx     Prior to Admission medications   Medication Sig Start Date End Date Taking? Authorizing Provider  acetaminophen (TYLENOL) 500 MG tablet Take 500 mg by mouth every 6 (six) hours as needed for pain.   Yes Historical Provider, MD  aspirin 81 MG EC tablet Take 1 tablet (81 mg total) by mouth daily. 05/04/13  Yes Emina Riebock, NP  docusate sodium 100 MG CAPS Take 100 mg by mouth 2 (two) times daily. 04/20/13  Yes Emina Riebock, NP  lisinopril (PRINIVIL,ZESTRIL) 5 MG tablet Take 1 tablet (5 mg total) by mouth daily. 06/30/13  Yes Corwin Levins, MD  Multiple Vitamins-Minerals (ICAPS) CAPS Take 1 capsule by mouth daily.    Yes Historical Provider, MD  NITROSTAT 0.4  MG SL tablet place 1 tablet under the tongue every 5 minutes for 3 doses if needed for chest pain 04/12/13  Yes Corwin Levins, MD  pantoprazole (PROTONIX) 20 MG tablet Take 1 tablet (20 mg total) by mouth daily. 06/25/13  Yes Chrystie Nose, MD  polyethylene glycol (MIRALAX / GLYCOLAX) packet Take 17 g by mouth daily as needed. Constipation   Yes Historical Provider, MD  simvastatin (ZOCOR) 10 MG tablet Take 1 tablet (10 mg total) by mouth daily. 06/25/13  Yes Chrystie Nose, MD  Tamsulosin HCl (FLOMAX) 0.4 MG CAPS Take by mouth daily.     Yes Historical Provider, MD   Physical Exam: Filed Vitals:   07/11/13 2345  BP: 139/71   Pulse: 99  Temp:   Resp: 15    General:  NAD, resting comfortably in bed Eyes: PEERLA EOMI ENT: mucous membranes moist Neck: supple w/o JVD Cardiovascular: RRR w/o MRG Respiratory: CTA B Abdomen: soft, nt, nd, bs+ Skin: no rash nor lesion Musculoskeletal: MAE, full ROM all 4 extremities Psychiatric: normal tone and affect Neurologic: AAOx3, grossly non-focal  Labs on Admission:  Basic Metabolic Panel:  Recent Labs Lab 07/11/13 2042  NA 137  K 3.9  CL 97  CO2 26  GLUCOSE 116*  BUN 24*  CREATININE 1.00  CALCIUM 9.7   Liver Function Tests: No results found for this basename: AST, ALT, ALKPHOS, BILITOT, PROT, ALBUMIN,  in the last 168 hours No results found for this basename: LIPASE, AMYLASE,  in the last 168 hours No results found for this basename: AMMONIA,  in the last 168 hours CBC:  Recent Labs Lab 07/11/13 2042  WBC 4.7  HGB 14.8  HCT 41.4  MCV 97.6  PLT 180   Cardiac Enzymes: No results found for this basename: CKTOTAL, CKMB, CKMBINDEX, TROPONINI,  in the last 168 hours  BNP (last 3 results) No results found for this basename: PROBNP,  in the last 8760 hours CBG: No results found for this basename: GLUCAP,  in the last 168 hours  Radiological Exams on Admission: Dg Chest 2 View  07/11/2013   CLINICAL DATA:  Chest pain  EXAM: CHEST  2 VIEW  COMPARISON:  01/29/2012  FINDINGS: Cardiac shadow is within normal limits. The lungs are clear bilaterally. No sizable effusion are pneumothorax is noted. No acute bony abnormality seen.  IMPRESSION: No acute abnormality noted.   Electronically Signed   By: Alcide Clever M.D.   On: 07/11/2013 21:42    EKG: Independently reviewed.  S tach in the 110s.  Assessment/Plan Principal Problem:   Chest pain Active Problems:   CAD (coronary artery disease)   1. Chest pain - improved without intervention, HEART score = 4 on chest pain obs pathway, cards consulted and will see patient, decision regarding stress test  pending, serial trops ordered.  PE is possible given the tachycardia, but significantly less likely given the patient has no risk factors for this and is up and active, D.Dimer ordered at this time to attempt to rule this out, if D.Dimer positive then decision will have to be made regarding need for imaging to rule this out, will use DVT ppx dose of heparin but do not feel that full dose heparin indicated at this time.  Aortic dissection is highly unlikely given the history and described course.    Code Status: Full Code (must indicate code status--if unknown or must be presumed, indicate so) Family Communication: Spoke with family at bedside (indicate person spoken  with, if applicable, with phone number if by telephone) Disposition Plan: Admit to obs (indicate anticipated LOS)  Time spent: 70 min  Islah Eve M. [facility] Pager [phone]  If 7PM-7AM, please contact night-coverage [email or web address] Password [email or web address] 07/12/2013, 12:07 AM

## 2013-07-12 NOTE — Consult Note (Signed)
Reason for Consult: Chest pain  Referring Physician: Yusuf Cooley is an 79 y.o. male.  HPI: Eric Cooley presents with an episode of chest pain earlier this evening. Its onset was sudden and wasn't associated with exertion or stress. The pain was sharp and radiated to the abdomin and back. It went away on its own without any intervention. He presented to the ED for evaluation. Here in the ED he has had no pain or recurrent symptoms. He has been quite hypertensive but has not had any symptoms of headache. He does have a past medical history of CAD and had PCI back in 2010. He is not having any symptoms similar to what he had back then, although he is not 100%. He does have some diastolic dysfunction as well, but has a preserved LVEF as of 2011.   Past Medical History  Diagnosis Date  . Hypertension   . MI (myocardial infarction)   . 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 CAD (coronary artery disease) 03/29/2011  . HTN (hypertension) 03/29/2011  . Hyperlipidemia 03/29/2011  . BPH (benign prostatic hypertrophy) 03/29/2011  . GERD (gastroesophageal reflux disease) 03/29/2011  . S/P cholecystectomy 03/29/2011  . 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 Thrombocytopenia 03/29/2011  . Dizziness - giddy 03/29/2011  . Allergic rhinitis, cause unspecified 03/29/2011  . S/P partial gastrectomy 03/29/2011  . Anemia 03/29/2011  . Arthritis   . Ulcer   . Urine incontinence   . Low back pain   . DJD (degenerative joint disease), lumbar 04/01/2011  . Prostate cancer 03/29/2011  . History of nuclear stress test 03/27/2010    exercise; no ischemic changes, normal study     Past Surgical History  Procedure Laterality Date  . Coronary angioplasty with stent placement  04/24/2009    2 Xience DES to LAD 3.5x53mm and 3.0x27mm overlapping  . Tonsillectomy  1951  . Cholecystectomy  1986  . 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 Transthoracic  echocardiogram  10/09/2009    EF 55-60%; normaml LV systolic function with grade 1 diastolic dysfunction    Family History  Problem Relation Age of Onset  . Arthritis Mother   . Ovarian cancer Mother   . Heart failure Mother   . Arthritis Father   . Lung cancer Father   . Colon cancer Neg Hx     Social History:  reports that he quit smoking about 52 years ago. He has never used smokeless tobacco. He reports that he drinks about 0.6 ounces of alcohol per week. He reports that he does not use illicit drugs.  Allergies: No Known Allergies  Medications: I have reviewed the patient's current medications.  Results for orders placed during the hospital encounter of 07/11/13 (from the past 48 hour(s))  CBC     Status: Abnormal   Collection Time    07/11/13  8:42 PM      Result Value Range   WBC 4.7  4.0 - 10.5 K/uL   RBC 4.24  4.22 - 5.81 MIL/uL   Hemoglobin 14.8  13.0 - 17.0 g/dL   HCT 16.1  09.6 - 04.5 %   MCV 97.6  78.0 - 100.0 fL   MCH 34.9 (*) 26.0 - 34.0 pg   MCHC 35.7  30.0 - 36.0 g/dL   RDW 40.9  81.1 - 91.4 %   Platelets 180  150 - 400 K/uL  BASIC METABOLIC PANEL  Status: Abnormal   Collection Time    07/11/13  8:42 PM      Result Value Range   Sodium 137  135 - 145 mEq/L   Potassium 3.9  3.5 - 5.1 mEq/L   Chloride 97  96 - 112 mEq/L   CO2 26  19 - 32 mEq/L   Glucose, Bld 116 (*) 70 - 99 mg/dL   BUN 24 (*) 6 - 23 mg/dL   Creatinine, Ser 8.65  0.50 - 1.35 mg/dL   Calcium 9.7  8.4 - 78.4 mg/dL   GFR calc non Af Amer 64 (*) >90 mL/min   GFR calc Af Amer 74 (*) >90 mL/min   Comment: (NOTE)     The eGFR has been calculated using the CKD EPI equation.     This calculation has not been validated in all clinical situations.     eGFR's persistently <90 mL/min signify possible Chronic Kidney     Disease.  POCT I-STAT TROPONIN I     Status: None   Collection Time    07/11/13  8:50 PM      Result Value Range   Troponin i, poc 0.00  0.00 - 0.08 ng/mL   Comment 3             Comment: Due to the release kinetics of cTnI,     a negative result within the first hours     of the onset of symptoms does not rule out     myocardial infarction with certainty.     If myocardial infarction is still suspected,     repeat the test at appropriate intervals.  LACTIC ACID, PLASMA     Status: None   Collection Time    07/11/13  9:14 PM      Result Value Range   Lactic Acid, Venous 1.3  0.5 - 2.2 mmol/L    Dg Chest 2 View  07/11/2013   CLINICAL DATA:  Chest pain  EXAM: CHEST  2 VIEW  COMPARISON:  01/29/2012  FINDINGS: Cardiac shadow is within normal limits. The lungs are clear bilaterally. No sizable effusion are pneumothorax is noted. No acute bony abnormality seen.  IMPRESSION: No acute abnormality noted.   Electronically Signed   By: Alcide Clever M.D.   On: 07/11/2013 21:42    Review of Systems  Constitutional: Negative.   HENT: Negative.   Eyes: Negative.   Respiratory: Negative for cough.   Cardiovascular: Positive for chest pain. Negative for PND.  Gastrointestinal: Negative.   Genitourinary: Negative.   Musculoskeletal: Negative.   Skin: Negative.   Psychiatric/Behavioral: Negative.   All other systems reviewed and are negative.   Blood pressure 154/83, pulse 110, temperature 97.8 F (36.6 C), temperature source Oral, resp. rate 18, height 5\' 8"  (1.727 m), weight 120 lb 3.2 oz (54.522 kg), SpO2 98.00%. Physical Exam  Constitutional: He is oriented to person, place, and time. No distress.  Cardiovascular: Normal rate, regular rhythm, S1 normal, S2 normal and normal heart sounds.   Occasional extrasystoles are present.  Pulses:      Dorsalis pedis pulses are 2+ on the right side, and 2+ on the left side.       Posterior tibial pulses are 2+ on the right side, and 2+ on the left side.  Respiratory: Effort normal and breath sounds normal. He has no wheezes. He has no rales.  GI: Soft. Bowel sounds are normal.  Musculoskeletal: He exhibits no edema.   Neurological: He is alert  and oriented to person, place, and time.  Skin: He is not diaphoretic.   ECG: Sinus rhythm  Assessment/Plan: Eric Kuehl is a pleasant gentleman with known CAD who presents with non-cardiac chest pain. His first cardiac biomarker is normal.   Chest pain- Would recommend continuing his aspirin but would not plavix load or start systemic anticoagulation. Would check troponin at 6 and 12 hours. If negative, then he can be seen at his next scheduled follow up.   Hypertension- Eric Cremer has had elevated BP here, so it would be reasonable to increase his lisinopril for better control especially given his history of diastolic dysfunction.   Coronary artery disease- Stable, would continue his aspirin and statin.   Terik Haughey C 07/12/2013, 1:21 AM

## 2013-07-12 NOTE — Progress Notes (Signed)
INITIAL NUTRITION ASSESSMENT  DOCUMENTATION CODES Per approved criteria  -Severe malnutrition in the context of chronic illness -Underweight   INTERVENTION:  Ensure Complete PO BID, each supplement provides 350 kcal and 13 grams of protein.  NUTRITION DIAGNOSIS: Malnutrition related to inadequate oral intake as evidenced by severe loss of muscle and subcutaneous fat mass with 14% weight loss in 6 months.   Goal: Intake to meet >90% of estimated nutrition needs.  Monitor:  PO intake, labs, weight trend.  Reason for Assessment: MST=2  79 y.o. male  Admitting Dx: Chest pain  ASSESSMENT: Patient came to the ED after developing chest pain. Cardiac work-up is ongoing. Patient reports that his usual weight is ~145 lb. He starting losing weight ~3 years ago after he had surgery for a benign gastric tumor. He dislocated his shoulder a few months ago, which has led to additional weight loss. He says that his appetite is good and he tries to eat high calorie foods. Discussed ways to increase calorie and protein intake to promote weight gain. He agreed to try Ensure, but does not like sweet foods/beverages.  Nutrition Focused Physical Exam:  Subcutaneous Fat:  Orbital Region: mild-moderate depletion Upper Arm Region: severe depletion Thoracic and Lumbar Region: WNL  Muscle:  Temple Region: mild-moderate depletion Clavicle Bone Region: severe depletion Clavicle and Acromion Bone Region: severe depletion Scapular Bone Region: WNL Dorsal Hand: severe depletion Patellar Region: mild-moderate depletion Anterior Thigh Region: mild-moderate depletion Posterior Calf Region: severe depletion  Edema: none  Height: Ht Readings from Last 1 Encounters:  07/12/13 5\' 8"  (1.727 m)    Weight: Wt Readings from Last 1 Encounters:  07/12/13 120 lb 3.2 oz (54.522 kg)    Ideal Body Weight: 70 kg  % Ideal Body Weight: 78%  Wt Readings from Last 10 Encounters:  07/12/13 120 lb 3.2 oz  (54.522 kg)  06/25/13 125 lb 12.8 oz (57.063 kg)  05/28/13 129 lb 6 oz (58.684 kg)  04/22/13 130 lb (58.968 kg)  04/17/13 132 lb 0.9 oz (59.9 kg)  01/14/13 140 lb 6 oz (63.674 kg)  09/25/12 134 lb 4 oz (60.895 kg)  07/16/12 121 lb (54.885 kg)  05/22/12 123 lb (55.792 kg)  05/05/12 123 lb (55.792 kg)    Usual Body Weight: 140 lb  % Usual Body Weight: 86%  BMI:  Body mass index is 18.28 kg/(m^2). Underweight  Estimated Nutritional Needs: Kcal: 1600-1800  Protein: 80-95 gm Fluid: 1.6-1.8 L  Skin: no wounds  Diet Order: Cardiac  EDUCATION NEEDS: -Education needs addressed-discussed ways to increase protein and calories   Intake/Output Summary (Last 24 hours) at 07/12/13 1001 Last data filed at 07/12/13 0430  Gross per 24 hour  Intake      0 ml  Output    600 ml  Net   -600 ml    Last BM: 10/6   Labs:   Recent Labs Lab 07/11/13 2042  NA 137  K 3.9  CL 97  CO2 26  BUN 24*  CREATININE 1.00  CALCIUM 9.7  GLUCOSE 116*    CBG (last 3)  No results found for this basename: GLUCAP,  in the last 72 hours  Scheduled Meds: . aspirin  81 mg Oral Daily  . docusate sodium  100 mg Oral BID  . heparin  5,000 Units Subcutaneous Q8H  . lisinopril  5 mg Oral Daily  . multivitamin-lutein  1 capsule Oral Daily  . pantoprazole  20 mg Oral Daily  . simvastatin  10 mg  Oral Daily    Continuous Infusions:   Past Medical History  Diagnosis Date  . Hypertension   . MI (myocardial infarction)   . 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 CAD (coronary artery disease) 03/29/2011  . HTN (hypertension) 03/29/2011  . Hyperlipidemia 03/29/2011  . BPH (benign prostatic hypertrophy) 03/29/2011  . GERD (gastroesophageal reflux disease) 03/29/2011  . S/P cholecystectomy 03/29/2011  . 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 Thrombocytopenia 03/29/2011  . Dizziness - giddy 03/29/2011  . Allergic rhinitis, cause unspecified 03/29/2011  . S/P partial  gastrectomy 03/29/2011  . Anemia 03/29/2011  . Arthritis   . Ulcer   . Urine incontinence   . Low back pain   . DJD (degenerative joint disease), lumbar 04/01/2011  . Prostate cancer 03/29/2011  . History of nuclear stress test 03/27/2010    exercise; no ischemic changes, normal study     Past Surgical History  Procedure Laterality Date  . Coronary angioplasty with stent placement  04/24/2009    2 Xience DES to LAD 3.5x52mm and 3.0x66mm overlapping  . Tonsillectomy  1951  . Cholecystectomy  1986  . 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 Transthoracic echocardiogram  10/09/2009    EF 55-60%; normaml LV systolic function with grade 1 diastolic dysfunction    Joaquin Courts, RD, LDN, CNSC Pager [phone] After Hours Pager [phone]

## 2013-07-12 NOTE — Progress Notes (Signed)
[facility]  Subjective: Currently Pain free. No SOB.   Objective: Vital signs in last 24 hours: Temp:  [97.8 F (36.6 C)-97.9 F (36.6 C)] 97.8 F (36.6 C) (10/06 0602) Pulse Rate:  [86-110] 86 (10/06 0602) Resp:  [9-23] 18 (10/06 0602) BP: (124-187)/(43-89) 137/72 mmHg (10/06 0602) SpO2:  [96 %-99 %] 98 % (10/06 0602) Weight:  [120 lb 3.2 oz (54.522 kg)] 120 lb 3.2 oz (54.522 kg) (10/06 0058) Last BM Date: 07/12/13  Intake/Output from previous day: 10/05 0701 - 10/06 0700 In: -  Out: 600 [Urine:600] Intake/Output this shift:    Medications Current Facility-Administered Medications  Medication Dose Route Frequency Provider Last Rate Last Dose  . acetaminophen (TYLENOL) tablet 650 mg  650 mg Oral Q4H PRN Hillary Bow, DO      . aspirin chewable tablet 81 mg  81 mg Oral Daily Hillary Bow, DO   81 mg at 07/12/13 0207  . docusate sodium (COLACE) capsule 100 mg  100 mg Oral BID Hillary Bow, DO      . heparin injection 5,000 Units  5,000 Units Subcutaneous Q8H Hillary Bow, DO   5,000 Units at 07/12/13 0701  . lisinopril (PRINIVIL,ZESTRIL) tablet 5 mg  5 mg Oral Daily Hillary Bow, DO   5 mg at 07/12/13 0206  . multivitamin-lutein (OCUVITE-LUTEIN) capsule 1 capsule  1 capsule Oral Daily Hillary Bow, DO   1 capsule at 07/12/13 0207  . ondansetron (ZOFRAN) injection 4 mg  4 mg Intravenous Q6H PRN Hillary Bow, DO      . pantoprazole (PROTONIX) EC tablet 20 mg  20 mg Oral Daily Hillary Bow, DO   20 mg at 07/12/13 0207  . polyethylene glycol (MIRALAX / GLYCOLAX) packet 17 g  17 g Oral Daily PRN Hillary Bow, DO      . simvastatin (ZOCOR) tablet 10 mg  10 mg Oral Daily Hillary Bow, DO   10 mg at 07/12/13 0207    PE: General appearance: alert, cooperative and no distress Lungs: decreased breath sound bilaterally Heart: regular rate and rhythm, S1, S2 normal, no murmur, click, rub or gallop Abdomen: soft,  non-tender; bowel sounds normal; no masses,  no organomegaly Extremities: no LEE Pulses: 2+ and symmetric Skin: warm and dry Neurologic: Grossly normal  Lab Results:   Recent Labs  07/11/13 2042  WBC 4.7  HGB 14.8  HCT 41.4  PLT 180   BMET  Recent Labs  07/11/13 2042  NA 137  K 3.9  CL 97  CO2 26  GLUCOSE 116*  BUN 24*  CREATININE 1.00  CALCIUM 9.7   Cardiac Panel (last 3 results) No results found for this basename: CKTOTAL, CKMB, TROPONINI, RELINDX,  in the last 72 hours   Assessment/Plan  Principal Problem:   Chest pain Active Problems:   CAD (coronary artery disease)  Plan: No recurrent left lower chest/ LUQ pain. Pain description is atypical and likely not cardiac. POC troponin was negative. However, lab troponin's were never ordered last night. Will order a lab troponin for safe measure/ to rule out MI. EKG yesterday showed sinus tachycardia with PACs. He is in NSR on telemetry today w/ HR in the 60s. BP is stable. CXR is unremarkable. Physical exam is benign. D-dimer is elevated at 1.69. V/Q scan has been ordered to rule out possible PE. With history, normal physical exam, vitals and normal mediastial contours, I feel that dissection is less likely.  MD to follow with further recommendations. Will continue to follow.     LOS: 1 day   Brittainy M. Sharol Harness, PA-C 07/12/2013 8:50 AM  I have seen and evaluated the patient this PM along with Boyce Medici, PA. I agree with her findings, examination as well as impression recommendations.  Very pleasant elderly man with h/o PCI in 2010 for SSx of fatigue and nausea.   He was admitted for LUQ pain following R shoulder injury a few weeks.  He has r/o for MI & PE.  I agree that this Sx is very atypical for angina & is not similar to his prior "Angina Sx".   He has not had any further Sx after admission --> I think considering an OP Myoview is not unreasonable, but will defer to Dr. Maryln Manuel will be here in tomorrow.   Otherwise, he is probably OK for d/c home from a cardiac standpoint.   MD Time with pt: 15 min  Lizandro Spellman W, M.D., M.S. [facility] [address] [place], [place]  [address]  [phone] Pager # [phone] 07/12/2013 6:07 PM

## 2013-07-12 NOTE — Progress Notes (Signed)
Patient admitted early this AM by Dr. Julian Reil.  Please see H&P.  D dimer elevated- check V/Q as patient has mild renal insuff and will cycle CE  Marlin Canary DO

## 2013-07-12 NOTE — Progress Notes (Signed)
Utilization review completed.  

## 2013-07-13 ENCOUNTER — Other Ambulatory Visit: Payer: Self-pay | Admitting: Physician Assistant

## 2013-07-13 DIAGNOSIS — R079 Chest pain, unspecified: Secondary | ICD-10-CM

## 2013-07-13 DIAGNOSIS — E785 Hyperlipidemia, unspecified: Secondary | ICD-10-CM

## 2013-07-13 DIAGNOSIS — I10 Essential (primary) hypertension: Secondary | ICD-10-CM

## 2013-07-13 LAB — BASIC METABOLIC PANEL
BUN: 20 mg/dL (ref 6–23)
CO2: 26 mEq/L (ref 19–32)
Chloride: 104 mEq/L (ref 96–112)
GFR calc non Af Amer: 71 mL/min — ABNORMAL LOW (ref >90)
Glucose, Bld: 87 mg/dL (ref 70–99)
Potassium: 3.8 mEq/L (ref 3.5–5.1)
Sodium: 140 mEq/L (ref 135–145)

## 2013-07-13 LAB — CBC
HCT: 37.6 % — ABNORMAL LOW (ref 39.0–52.0)
Hemoglobin: 12.8 g/dL — ABNORMAL LOW (ref 13.0–17.0)
MCHC: 34 g/dL (ref 30.0–36.0)
Platelets: 162 10*3/uL (ref 150–400)

## 2013-07-13 NOTE — Progress Notes (Signed)
Reviewed discharge instructions with patient and he stated his understanding.  Discharged home with son. Eric Cooley Danielle   

## 2013-07-13 NOTE — Progress Notes (Signed)
    Subjective: No further CP which was really at the left lower rib.  Objective: Vital signs in last 24 hours: Temp:  [97.4 F (36.3 C)-97.8 F (36.6 C)] 97.8 F (36.6 C) (10/07 0508) Pulse Rate:  [65-98] 65 (10/07 0508) Resp:  [18-21] 18 (10/07 0508) BP: (114-115)/(52-70) 115/65 mmHg (10/07 0508) SpO2:  [98 %] 98 % (10/07 0508) Last BM Date: 07/12/13  Intake/Output from previous day: 10/06 0701 - 10/07 0700 In: -  Out: 200 [Urine:200] Intake/Output this shift:    Medications Current Facility-Administered Medications  Medication Dose Route Frequency Provider Last Rate Last Dose  . acetaminophen (TYLENOL) tablet 650 mg  650 mg Oral Q4H PRN Hillary Bow, DO   650 mg at 07/12/13 2128  . aspirin chewable tablet 81 mg  81 mg Oral Daily Hillary Bow, DO   81 mg at 07/12/13 2132  . docusate sodium (COLACE) capsule 100 mg  100 mg Oral BID Hillary Bow, DO   100 mg at 07/12/13 2129  . feeding supplement (ENSURE COMPLETE) liquid 237 mL  237 mL Oral BID BM Hettie Holstein, RD   237 mL at 07/12/13 1344  . heparin injection 5,000 Units  5,000 Units Subcutaneous Q8H Hillary Bow, DO   5,000 Units at 07/13/13 0507  . lisinopril (PRINIVIL,ZESTRIL) tablet 5 mg  5 mg Oral Daily Hillary Bow, DO   5 mg at 07/12/13 2129  . multivitamin-lutein (OCUVITE-LUTEIN) capsule 1 capsule  1 capsule Oral Daily Hillary Bow, DO   1 capsule at 07/12/13 2129  . ondansetron (ZOFRAN) injection 4 mg  4 mg Intravenous Q6H PRN Hillary Bow, DO      . pantoprazole (PROTONIX) EC tablet 20 mg  20 mg Oral Daily Hillary Bow, DO   20 mg at 07/12/13 2129  . polyethylene glycol (MIRALAX / GLYCOLAX) packet 17 g  17 g Oral Daily PRN Hillary Bow, DO      . simvastatin (ZOCOR) tablet 10 mg  10 mg Oral Daily Hillary Bow, DO   10 mg at 07/12/13 2129    PE: General appearance: alert, cooperative and no distress Lungs: clear to auscultation bilaterally Heart: Reg rhythm.  Rate  elevated. Extremities: No LEE Pulses: 2+ and symmetric Neurologic: Grossly normal  Lab Results:   Recent Labs  07/11/13 2042 07/13/13 0520  WBC 4.7 3.4*  HGB 14.8 12.8*  HCT 41.4 37.6*  PLT 180 162   BMET  Recent Labs  07/11/13 2042 07/13/13 0520  NA 137 140  K 3.9 3.8  CL 97 104  CO2 26 26  GLUCOSE 116* 87  BUN 24* 20  CREATININE 1.00 0.95  CALCIUM 9.7 9.0    Assessment/Plan   Principal Problem:   Chest pain with low risk for cardiac etiology Active Problems:   CAD (coronary artery disease)   Protein-calorie malnutrition, severe  Plan:  Ruled out for MI.  Low probability for PE on VQ scan.  BP stable.  HR mildly tachy on tele.  Pain description sounds noncardiac.     LOS: 2 days    HAGER, BRYAN 07/13/2013 9:03 AM  Patient seen and examined. Agree with assessment and plan. Pt feels well; no further discomfort.  OK to dc today and agree with outpatient pharmacologic nuclear stress evaluation. Suspect noncardiac chest pain. Will arrange as outpatient.   Lennette Bihari, MD, [facility] 07/13/2013 10:53 AM

## 2013-07-13 NOTE — Discharge Summary (Signed)
Physician Discharge Summary  Eric Cooley XBJ:[id number] DOB: [birth year] DOA: 07/11/2013  PCP: Oliver Barre, MD  Admit date: 07/11/2013 Discharge date: 07/13/2013  Time spent: 35 minutes  Recommendations for Outpatient Follow-up:  Outpatient stress test  Discharge Diagnoses:  Principal Problem:   Chest pain with low risk for cardiac etiology Active Problems:   CAD (coronary artery disease)   Protein-calorie malnutrition, severe   Discharge Condition: improved  Diet recommendation: cardiac  Filed Weights   07/12/13 0058  Weight: 54.522 kg (120 lb 3.2 oz)    History of present illness:  Eric Cooley is a 79 y.o. male who presents to the ED after developing chest pain. Symptoms onset, earlier this evening but have been improving since onset without intervention. By the time he got to the ED, he states his chest pain has completely resolved. He did not get a chance to take any NTG at home (misplaced his NTG bottle since he never used it). Chest pain is located on the left side of his chest, radiates to L shoulder and back as well as LUQ abdomen.  Patient's last Cath was 3 years ago or so, he did have a stent put in at that time. Hospitalist asked to admit as r/o ACS.   Hospital Course:  Chest pain with low risk for cardiac etiology  Active Problems:  CAD (coronary artery disease)  Protein-calorie malnutrition, severe   Plan: Ruled out for MI. Low probability for PE on VQ scan. Outpatient stress test  Procedures:  V/q  Consultations:  cardiology  Discharge Exam: Filed Vitals:   07/13/13 0508  BP: 115/65  Pulse: 65  Temp: 97.8 F (36.6 C)  Resp: 18    General: A+OX3, NAD Cardiovascular: rrr Respiratory: clear anterior  Discharge Instructions  Discharge Orders   Future Appointments Provider Department Dept Phone   07/16/2013 1:30 PM Corwin Levins, MD [facility] [phone]   Future Orders Complete By Expires   Diet - low sodium  heart healthy  As directed    Increase activity slowly  As directed        Medication List         acetaminophen 500 MG tablet  Commonly known as:  TYLENOL  Take 500 mg by mouth every 6 (six) hours as needed for pain.     aspirin 81 MG EC tablet  Take 1 tablet (81 mg total) by mouth daily.     DSS 100 MG Caps  Take 100 mg by mouth 2 (two) times daily.     FLOMAX 0.4 MG Caps capsule  Generic drug:  tamsulosin  Take by mouth daily.     ICAPS Caps  Take 1 capsule by mouth daily.     lisinopril 5 MG tablet  Commonly known as:  PRINIVIL,ZESTRIL  Take 1 tablet (5 mg total) by mouth daily.     NITROSTAT 0.4 MG SL tablet  Generic drug:  nitroGLYCERIN  place 1 tablet under the tongue every 5 minutes for 3 doses if needed for chest pain     pantoprazole 20 MG tablet  Commonly known as:  PROTONIX  Take 1 tablet (20 mg total) by mouth daily.     polyethylene glycol packet  Commonly known as:  MIRALAX / GLYCOLAX  Take 17 g by mouth daily as needed. Constipation     simvastatin 10 MG tablet  Commonly known as:  ZOCOR  Take 1 tablet (10 mg total) by mouth daily.  No Known Allergies     Follow-up Information   Follow up with Oliver Barre, MD In 1 week.   Specialties:  Internal Medicine, Radiology   Contact information:   [address] [place] [address] [phone]        The results of significant diagnostics from this hospitalization (including imaging, microbiology, ancillary and laboratory) are listed below for reference.    Significant Diagnostic Studies: Dg Chest 2 View  07/11/2013   CLINICAL DATA:  Chest pain  EXAM: CHEST  2 VIEW  COMPARISON:  01/29/2012  FINDINGS: Cardiac shadow is within normal limits. The lungs are clear bilaterally. No sizable effusion are pneumothorax is noted. No acute bony abnormality seen.  IMPRESSION: No acute abnormality noted.   Electronically Signed   By: Alcide Clever M.D.   On: 07/11/2013 21:42   Nm Pulmonary Perf  And Vent  07/12/2013   CLINICAL DATA:  Chest pain, elevated D-dimer, coronary artery disease post MI and coronary PTCA, former smoker, history prostate cancer  EXAM: NUCLEAR MEDICINE VENTILATION - PERFUSION LUNG SCAN  TECHNIQUE: Ventilation images were obtained in multiple projections using inhaled aerosol technetium 99 M DTPA. Perfusion images were obtained in multiple projections after intravenous injection of Tc-98m MAA.  COMPARISON:  None  Radiographic correlation: 07/11/2013  RADIOPHARMACEUTICALS:  40 mCi Tc-73m DTPA aerosol and 6 mCi Tc-91m MAA  FINDINGS: Ventilation: Diminished ventilation right middle lobe. Irregular peripheral ventilation in the upper lobes bilaterally. Central airway deposition of tracer.  Perfusion: Minimal irregularity of perfusion in the periphery of the upper lobes, less severe than the ventilatory findings. No other segmental or subsegmental perfusion defects identified.  IMPRESSION: Very low probability for pulmonary embolism.   Electronically Signed   By: Ulyses Southward M.D.   On: 07/12/2013 13:36    Microbiology: No results found for this or any previous visit (from the past 240 hour(s)).   Labs: Basic Metabolic Panel:  Recent Labs Lab 07/11/13 2042 07/13/13 0520  NA 137 140  K 3.9 3.8  CL 97 104  CO2 26 26  GLUCOSE 116* 87  BUN 24* 20  CREATININE 1.00 0.95  CALCIUM 9.7 9.0   Liver Function Tests: No results found for this basename: AST, ALT, ALKPHOS, BILITOT, PROT, ALBUMIN,  in the last 168 hours No results found for this basename: LIPASE, AMYLASE,  in the last 168 hours No results found for this basename: AMMONIA,  in the last 168 hours CBC:  Recent Labs Lab 07/11/13 2042 07/13/13 0520  WBC 4.7 3.4*  HGB 14.8 12.8*  HCT 41.4 37.6*  MCV 97.6 97.7  PLT 180 162   Cardiac Enzymes:  Recent Labs Lab 07/12/13 1101 07/12/13 1730 07/12/13 2235  TROPONINI <0.30 <0.30 <0.30   BNP: BNP (last 3 results) No results found for this basename: PROBNP,   in the last 8760 hours CBG: No results found for this basename: GLUCAP,  in the last 168 hours     Signed:  Marlin Canary  [facility] 07/13/2013, 9:06 AM

## 2013-07-15 ENCOUNTER — Encounter: Payer: Self-pay | Admitting: Internal Medicine

## 2013-07-16 ENCOUNTER — Encounter: Payer: Self-pay | Admitting: Internal Medicine

## 2013-07-16 ENCOUNTER — Ambulatory Visit: Payer: Medicare Other | Admitting: Internal Medicine

## 2013-07-16 ENCOUNTER — Other Ambulatory Visit (INDEPENDENT_AMBULATORY_CARE_PROVIDER_SITE_OTHER): Payer: Medicare Other

## 2013-07-16 ENCOUNTER — Ambulatory Visit (INDEPENDENT_AMBULATORY_CARE_PROVIDER_SITE_OTHER): Payer: Medicare Other | Admitting: Internal Medicine

## 2013-07-16 VITALS — BP 140/84 | HR 77 | Temp 97.7°F | Ht 68.0 in | Wt 125.1 lb

## 2013-07-16 DIAGNOSIS — R634 Abnormal weight loss: Secondary | ICD-10-CM | POA: Insufficient documentation

## 2013-07-16 DIAGNOSIS — C61 Malignant neoplasm of prostate: Secondary | ICD-10-CM

## 2013-07-16 DIAGNOSIS — R413 Other amnesia: Secondary | ICD-10-CM

## 2013-07-16 DIAGNOSIS — M545 Low back pain: Secondary | ICD-10-CM

## 2013-07-16 DIAGNOSIS — Z23 Encounter for immunization: Secondary | ICD-10-CM

## 2013-07-16 DIAGNOSIS — I10 Essential (primary) hypertension: Secondary | ICD-10-CM

## 2013-07-16 MED ORDER — DONEPEZIL HCL 5 MG PO TABS: 5.0000 mg | ORAL_TABLET | Freq: Every day | ORAL | Status: DC

## 2013-07-16 MED ORDER — ACETAMINOPHEN-CODEINE 300-30 MG PO TABS: 1.0000 | ORAL_TABLET | Freq: Every day | ORAL | Status: DC | PRN

## 2013-07-16 NOTE — Progress Notes (Signed)
Subjective:    Patient ID: PASHA BROAD, male    DOB: 17-Jul-1920, 79 y.o.   MRN: [id number]  HPI  Here to f/u with daughter (who is MD - psychiatrist), has hx of right shoulder dislocation; more recently oct 6 with pain to left upper chest and shoulder, MI ruled out. Pt denies other chest pain, increased sob or doe, wheezing, orthopnea, PND, increased LE swelling, palpitations, dizziness or syncope.  Has mild worsening ST memory difficulty in the past 6 mo, daughter asks about aricept use.  No gait worsening or worsening urinary difficultues to suggest NPH as cause.  Has unfortunately lost approx 15 lbs as well, though states appetite ok. Denies worsening depressive symptoms, suicidal ideation, or panic; Denies hyper or hypo thyroid symptoms such as voice, skin or hair change. For flu shot today. Overall good compliance with treatment, and good medicine tolerability.  Pt continues to have recurring LBP without change in severity, bowel or bladder change, fever, wt loss,  worsening LE pain/numbness/weakness, gait change or falls. Past Medical History  Diagnosis Date  . Hypertension   . MI (myocardial infarction)   . 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 CAD (coronary artery disease) 03/29/2011  . HTN (hypertension) 03/29/2011  . Hyperlipidemia 03/29/2011  . BPH (benign prostatic hypertrophy) 03/29/2011  . GERD (gastroesophageal reflux disease) 03/29/2011  . S/P cholecystectomy 03/29/2011  . 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 Thrombocytopenia 03/29/2011  . Dizziness - giddy 03/29/2011  . Allergic rhinitis, cause unspecified 03/29/2011  . S/P partial gastrectomy 03/29/2011  . Anemia 03/29/2011  . Arthritis   . Ulcer   . Urine incontinence   . Low back pain   . DJD (degenerative joint disease), lumbar 04/01/2011  . Prostate cancer 03/29/2011  . History of nuclear stress test 03/27/2010    exercise; no ischemic changes, normal study    Past Surgical History   Procedure Laterality Date  . Coronary angioplasty with stent placement  04/24/2009    2 Xience DES to LAD 3.5x12mm and 3.0x61mm overlapping  . Tonsillectomy  1951  . Cholecystectomy  1986  . 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 Transthoracic echocardiogram  10/09/2009    EF 55-60%; normaml LV systolic function with grade 1 diastolic dysfunction    reports that he quit smoking about 52 years ago. He has never used smokeless tobacco. He reports that he drinks about 0.6 ounces of alcohol per week. He reports that he does not use illicit drugs. family history includes Arthritis in his father and mother; Heart failure in his mother; Lung cancer in his father; Ovarian cancer in his mother. There is no history of Colon cancer. No Known Allergies Current Outpatient Prescriptions on File Prior to Visit  Medication Sig Dispense Refill  . acetaminophen (TYLENOL) 500 MG tablet Take 500 mg by mouth every 6 (six) hours as needed for pain.      Marland Kitchen aspirin 81 MG EC tablet Take 1 tablet (81 mg total) by mouth daily.  30 tablet  12  . docusate sodium 100 MG CAPS Take 100 mg by mouth 2 (two) times daily.  10 capsule  0  . lisinopril (PRINIVIL,ZESTRIL) 5 MG tablet Take 1 tablet (5 mg total) by mouth daily.  30 tablet  5  . Multiple Vitamins-Minerals (ICAPS) CAPS Take 1 capsule by mouth daily.       Marland Kitchen NITROSTAT 0.4 MG SL tablet place 1 tablet under the tongue  every 5 minutes for 3 doses if needed for chest pain  25 tablet  2  . pantoprazole (PROTONIX) 20 MG tablet Take 1 tablet (20 mg total) by mouth daily.  30 tablet  11  . polyethylene glycol (MIRALAX / GLYCOLAX) packet Take 17 g by mouth daily as needed. Constipation      . simvastatin (ZOCOR) 10 MG tablet Take 1 tablet (10 mg total) by mouth daily.  30 tablet  11  . Tamsulosin HCl (FLOMAX) 0.4 MG CAPS Take by mouth daily.        . [DISCONTINUED] fluticasone (FLONASE) 50 MCG/ACT nasal spray Place 2 sprays into the nose daily.          No current facility-administered medications on file prior to visit.   Review of Systems  Constitutional: Negative for unexpected weight change, or unusual diaphoresis  HENT: Negative for tinnitus.   Eyes: Negative for photophobia and visual disturbance.  Respiratory: Negative for choking and stridor.   Gastrointestinal: Negative for vomiting and blood in stool.  Genitourinary: Negative for hematuria and decreased urine volume.  Musculoskeletal: Negative for acute joint swelling Skin: Negative for color change and wound.  Neurological: Negative for tremors and numbness other than noted  Psychiatric/Behavioral: Negative for decreased concentration or  hyperactivity.       Objective:   Physical Exam BP 140/84  Pulse 77  Temp(Src) 97.7 F (36.5 C) (Oral)  Ht 5\' 8"  (1.727 m)  Wt 125 lb 2 oz (56.756 kg)  BMI 19.03 kg/m2  SpO2 97% VS noted,  Constitutional: Pt appears well-developed and well-nourished.  HENT: Head: NCAT.  Right Ear: External ear normal.  Left Ear: External ear normal.  Eyes: Conjunctivae and EOM are normal. Pupils are equal, round, and reactive to light.  Neck: Normal range of motion. Neck supple.  Cardiovascular: Normal rate and regular rhythm.   Pulmonary/Chest: Effort normal and breath sounds normal.  Abd:  Soft, NT, non-distended, + BS Neurological: Pt is alert. Motor 5/5 Skin: Skin is warm. No erythema.  Psychiatric: Pt behavior is normal. Thought content with mild ST memory loss, MMSE formal not done    Assessment & Plan:

## 2013-07-16 NOTE — Patient Instructions (Addendum)
You had the flu shot today Please make Nurse Visit appt for 2 wks or after for the Prevnar pneumonia shot OK for tylenol #3 once per day as needed for more severe pain Please take all new medication as prescribed - the aricept 5 mg per day OK for donepezil (aricept) at 5 mg per day Please have Glucerna at home to take as you can 1 can between each meal Please go to the LAB in the Basement (turn left off the elevator) for the tests to be done today (SPEP, PSA) You will be contacted by phone if any changes need to be made immediately.  Otherwise, you will receive a letter about your results with an explanation, but please check with MyChart first  Please return in 3 months, or sooner if needed

## 2013-07-19 DIAGNOSIS — R413 Other amnesia: Secondary | ICD-10-CM | POA: Insufficient documentation

## 2013-07-19 NOTE — Assessment & Plan Note (Addendum)
Ok for glucerna, also for SPEP, monitor further wt loss  Note:  Total time for pt hx, exam, review of record with pt in the room, determination of diagnoses and plan for further eval and tx is > 40 min, with over 50% spent in coordination and counseling of patient

## 2013-07-19 NOTE — Assessment & Plan Note (Addendum)
prob alzheimers, Ok for aricept 5 qd,  to f/u any worsening symptoms or concerns

## 2013-07-19 NOTE — Assessment & Plan Note (Signed)
D/w pt and daughter, ok to check PSA in light of wt loss, but they clearly do not want aggressive tx

## 2013-07-19 NOTE — Assessment & Plan Note (Signed)
Ok for tylenol prn, as well as tylenol #3 1 qd prn more severe pain

## 2013-07-19 NOTE — Assessment & Plan Note (Signed)
stable overall by history and exam, recent data reviewed with pt, and pt to continue medical treatment as before,  to f/u any worsening symptoms or concerns BP Readings from Last 3 Encounters:  07/16/13 140/84  07/13/13 115/65  06/25/13 112/76

## 2013-07-20 ENCOUNTER — Telehealth (HOSPITAL_COMMUNITY): Payer: Self-pay | Admitting: *Deleted

## 2013-07-20 ENCOUNTER — Other Ambulatory Visit: Payer: Self-pay | Admitting: Internal Medicine

## 2013-07-20 DIAGNOSIS — R899 Unspecified abnormal finding in specimens from other organs, systems and tissues: Secondary | ICD-10-CM

## 2013-07-20 LAB — PROTEIN ELECTROPHORESIS, SERUM
Albumin ELP: 58 % (ref 55.8–66.1)
Alpha-1-Globulin: 7 % — ABNORMAL HIGH (ref 2.9–4.9)
Alpha-2-Globulin: 12.2 % — ABNORMAL HIGH (ref 7.1–11.8)
Beta 2: 3.6 % (ref 3.2–6.5)
Beta Globulin: 6.9 % (ref 4.7–7.2)
Gamma Globulin: 12.3 % (ref 11.1–18.8)
M-Spike, %: 0.4 g/dL
Total Protein, Serum Electrophoresis: 6.1 g/dL (ref 6.0–8.3)

## 2013-07-22 ENCOUNTER — Other Ambulatory Visit: Payer: Medicare Other

## 2013-07-22 DIAGNOSIS — R899 Unspecified abnormal finding in specimens from other organs, systems and tissues: Secondary | ICD-10-CM

## 2013-07-27 LAB — IMMUNOFIXATION ELECTROPHORESIS
IgA: 80 mg/dL (ref 68–379)
IgG (Immunoglobin G), Serum: 897 mg/dL (ref 650–1600)
IgM, Serum: 36 mg/dL — ABNORMAL LOW (ref 41–251)
Total Protein, Serum Electrophoresis: 6.3 g/dL (ref 6.0–8.3)

## 2013-07-29 ENCOUNTER — Ambulatory Visit (INDEPENDENT_AMBULATORY_CARE_PROVIDER_SITE_OTHER): Payer: Medicare Other | Admitting: Internal Medicine

## 2013-07-29 ENCOUNTER — Encounter: Payer: Self-pay | Admitting: Internal Medicine

## 2013-07-29 VITALS — BP 124/60 | HR 76 | Ht 68.0 in | Wt 128.4 lb

## 2013-07-29 DIAGNOSIS — I10 Essential (primary) hypertension: Secondary | ICD-10-CM

## 2013-07-29 DIAGNOSIS — I2510 Atherosclerotic heart disease of native coronary artery without angina pectoris: Secondary | ICD-10-CM

## 2013-07-29 DIAGNOSIS — R079 Chest pain, unspecified: Secondary | ICD-10-CM

## 2013-07-29 DIAGNOSIS — E785 Hyperlipidemia, unspecified: Secondary | ICD-10-CM

## 2013-07-29 NOTE — Progress Notes (Signed)
OFFICE NOTE  Chief Complaint:  Ran out of cholesterol medication  Primary Care Physician: Oliver Barre, MD  HPI:  Eric Cooley is a 79 year old gentleman previously followed by Dr. Lynnea Ferrier with history of coronary artery disease status post 2 Xience stents to the LAD in 2010. EF was 55%. Unfortunately, he had a malignant GI stromal tumor and had partial gastrectomy in 2011. Otherwise, he is doing fairly well. He returns today with no specific complaints. Recently, he had an episode of dizziness and underwent CT and MRI of the head as well as MRA, both of which showed some chronic atrophy but no significant changes. This makes me wonder whether he is having some early dementia.  In July, he had a fall and struck his head. He had a CT scan which showed a small bleed. In some of his notes it was noted that he tripped and felt, but today he reported that he passed out. It is unclear what the true story was. He says his never done this before.  He denies any chest pain or shortness of breath. I saw him in April of this past year and he had run out of his cholesterol medications. He was doing very well that time and I refilled his medicines. Apparently a few weeks ago he presented to the hospital with an abnormal chest pain that awakened him in the middle of the night. He was admitted for 2 days and underwent a number of tests and rule out ultimately for MI. The pain was thought to be atypical but an outpatient nuclear stress test was recommended. Of note his last nuclear stress test was in 2011.  Since discharge he denies any further chest pain symptoms or worsening shortness of breath with exertion.  PMHx:  Past Medical History  Diagnosis Date  . Hypertension   . MI (myocardial infarction)   . 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 CAD (coronary artery disease) 03/29/2011  . HTN (hypertension) 03/29/2011  . Hyperlipidemia 03/29/2011  . BPH (benign prostatic hypertrophy) 03/29/2011  . GERD  (gastroesophageal reflux disease) 03/29/2011  . S/P cholecystectomy 03/29/2011  . 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 Thrombocytopenia 03/29/2011  . Dizziness - giddy 03/29/2011  . Allergic rhinitis, cause unspecified 03/29/2011  . S/P partial gastrectomy 03/29/2011  . Anemia 03/29/2011  . Arthritis   . Ulcer   . Urine incontinence   . Low back pain   . DJD (degenerative joint disease), lumbar 04/01/2011  . Prostate cancer 03/29/2011  . History of nuclear stress test 03/27/2010    exercise; no ischemic changes, normal study     Past Surgical History  Procedure Laterality Date  . Coronary angioplasty with stent placement  04/24/2009    2 Xience DES to LAD 3.5x32mm and 3.0x16mm overlapping  . Tonsillectomy  1951  . Cholecystectomy  1986  . 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 Transthoracic echocardiogram  10/09/2009    EF 55-60%; normaml LV systolic function with grade 1 diastolic dysfunction    FAMHx:  Family History  Problem Relation Age of Onset  . Arthritis Mother   . Ovarian cancer Mother   . Heart failure Mother   . Arthritis Father   . Lung cancer Father   . Colon cancer Neg Hx     SOCHx:   reports that he quit smoking about 52 years ago. He has never used smokeless tobacco. He reports that he  drinks about 0.6 ounces of alcohol per week. He reports that he does not use illicit drugs.  ALLERGIES:  No Known Allergies  ROS: A comprehensive review of systems was negative except for: Cardiovascular: positive for chest pain  HOME MEDS: Current Outpatient Prescriptions  Medication Sig Dispense Refill  . acetaminophen (TYLENOL) 500 MG tablet Take 500 mg by mouth every 6 (six) hours as needed for pain.      . Acetaminophen-Codeine (TYLENOL/CODEINE #3) 300-30 MG per tablet Take 1 tablet by mouth daily as needed for pain.  30 tablet  5  . aspirin 81 MG EC tablet Take 1 tablet (81 mg total) by mouth daily.  30 tablet  12   . docusate sodium 100 MG CAPS Take 100 mg by mouth 2 (two) times daily.  10 capsule  0  . donepezil (ARICEPT) 5 MG tablet Take 1 tablet (5 mg total) by mouth at bedtime.  90 tablet  3  . lisinopril (PRINIVIL,ZESTRIL) 5 MG tablet Take 1 tablet (5 mg total) by mouth daily.  30 tablet  5  . Multiple Vitamins-Minerals (ICAPS) CAPS Take 1 capsule by mouth daily.       Marland Kitchen NITROSTAT 0.4 MG SL tablet place 1 tablet under the tongue every 5 minutes for 3 doses if needed for chest pain  25 tablet  2  . pantoprazole (PROTONIX) 20 MG tablet Take 1 tablet (20 mg total) by mouth daily.  30 tablet  11  . polyethylene glycol (MIRALAX / GLYCOLAX) packet Take 17 g by mouth daily as needed. Constipation      . simvastatin (ZOCOR) 10 MG tablet Take 1 tablet (10 mg total) by mouth daily.  30 tablet  11  . Tamsulosin HCl (FLOMAX) 0.4 MG CAPS Take by mouth daily.        . [DISCONTINUED] fluticasone (FLONASE) 50 MCG/ACT nasal spray Place 2 sprays into the nose daily.         No current facility-administered medications for this visit.    LABS/IMAGING: No results found for this or any previous visit (from the past 48 hour(s)). No results found.  VITALS: BP 124/60  Pulse 76  Ht 5\' 8"  (1.727 m)  Wt 128 lb 6.4 oz (58.242 kg)  BMI 19.53 kg/m2  EXAM: General appearance: alert and no distress Neck: no adenopathy, no carotid bruit, no JVD, supple, symmetrical, trachea midline and thyroid not enlarged, symmetric, no tenderness/mass/nodules Lungs: clear to auscultation bilaterally Heart: regular rate and rhythm, S1, S2 normal, no murmur, click, rub or gallop Abdomen: soft, non-tender; bowel sounds normal; no masses,  no organomegaly Extremities: extremities normal, atraumatic, no cyanosis or edema Pulses: 2+ and symmetric Skin: Skin color, texture, turgor normal. No rashes or lesions Neurologic: Grossly normal  EKG: deferred  ASSESSMENT: 1. Coronary artery disease status post PCI x2 drug-eluting stents in  2010 to the proximal LAD 2. Atypical chest pain - ruled out for MI recently at [facility] 3. Hypertension at goal 4. Dyslipidemia - at goal on statin  PLAN: 1.   [name] denies any recurrent chest pain since his hospitalization. He did rule out for MI, but I agree that a stress test is a good idea since his last was in 2011. He is scheduled and we'll go ahead and obtain a nuclear stress test. I plan to call him back with the results of that stress test. With regards to his lipid profile, his recent NMR demonstrated an LDL of 78 and particle number less than 1000.  No changes will be made to his medications.  His blood pressure is optimally controlled today.  Thanks again for allowing me to participate in his care.  Chrystie Nose, MD, [facility] Attending Cardiologist [facility]  HILTY,Kenneth C 07/29/2013, 1:48 PM

## 2013-07-29 NOTE — Patient Instructions (Signed)
Your physician wants you to follow-up in: 6 months. You will receive a reminder letter in the mail two months in advance. If you don't receive a letter, please call our office to schedule the follow-up appointment.  Please schedule your stress test. We will call you with the results.

## 2013-08-03 ENCOUNTER — Ambulatory Visit (INDEPENDENT_AMBULATORY_CARE_PROVIDER_SITE_OTHER): Payer: Medicare Other

## 2013-08-03 DIAGNOSIS — Z23 Encounter for immunization: Secondary | ICD-10-CM

## 2013-08-04 ENCOUNTER — Telehealth: Payer: Self-pay | Admitting: Internal Medicine

## 2013-08-04 NOTE — Telephone Encounter (Signed)
Message forwarded to J. Elkins, RN.  

## 2013-08-04 NOTE — Telephone Encounter (Signed)
Want to make sure that Dr.Hilty received the message that he was canceling his appt for a procedure that he has schedule for tomorrow , but will explain more once he speaks to you .Marland KitchenMarland Kitchenplease call   Thanks

## 2013-08-04 NOTE — Telephone Encounter (Signed)
Returned patient's call - patient wanted to make sure Dr. Rennis Golden was aware he canceled his lexiscan scheduled for 08/05/13. Informed patient he would be notified. Patient seemed hesitant about this test, so RN explained the basics of what to expect. Patient stated he would call office to reschedule when ready.

## 2013-08-04 NOTE — Telephone Encounter (Signed)
Line busy when attempted to return call

## 2013-08-05 ENCOUNTER — Encounter (HOSPITAL_COMMUNITY): Payer: Medicare Other

## 2013-08-24 ENCOUNTER — Other Ambulatory Visit: Payer: Self-pay | Admitting: *Deleted

## 2013-08-24 MED ORDER — DSS 100 MG PO CAPS: 100.0000 mg | ORAL_CAPSULE | Freq: Two times a day (BID) | ORAL | Status: DC

## 2013-10-22 ENCOUNTER — Ambulatory Visit: Payer: Medicare Other | Admitting: Internal Medicine

## 2013-10-22 DIAGNOSIS — Z0289 Encounter for other administrative examinations: Secondary | ICD-10-CM

## 2014-05-27 ENCOUNTER — Ambulatory Visit (INDEPENDENT_AMBULATORY_CARE_PROVIDER_SITE_OTHER): Payer: Medicare Other | Admitting: Internal Medicine

## 2014-05-27 ENCOUNTER — Encounter: Payer: Self-pay | Admitting: Internal Medicine

## 2014-05-27 VITALS — BP 140/80 | HR 75 | Temp 97.7°F | Wt 137.0 lb

## 2014-05-27 DIAGNOSIS — H9193 Unspecified hearing loss, bilateral: Secondary | ICD-10-CM

## 2014-05-27 DIAGNOSIS — H9190 Unspecified hearing loss, unspecified ear: Secondary | ICD-10-CM

## 2014-05-27 DIAGNOSIS — H6120 Impacted cerumen, unspecified ear: Secondary | ICD-10-CM

## 2014-05-27 DIAGNOSIS — H9319 Tinnitus, unspecified ear: Secondary | ICD-10-CM

## 2014-05-27 DIAGNOSIS — H6123 Impacted cerumen, bilateral: Secondary | ICD-10-CM

## 2014-05-27 DIAGNOSIS — H9311 Tinnitus, right ear: Secondary | ICD-10-CM

## 2014-05-27 NOTE — Patient Instructions (Signed)
Please do not use Q-tips as we discussed. Should wax build up occur, please put 2-3 drops of mineral oil in the affected  ear at night to soften the wax .Cover the canal with a  cotton ball to prevent the oil from staining bed linens. In the morning fill the ear canal with hydrogen peroxide & lie in the opposite lateral decubitus position(on the side opposite the affected ear)  for 10-15 minutes. After allowing this period of time for the peroxide to dissolve the wax ;shower and use the thinnest washrag available to wick out the wax. If both ears are involved ; alternate this treatment from ear to ear each night until no wax is found on the washrag.

## 2014-05-27 NOTE — Progress Notes (Signed)
   Subjective:    Patient ID: Eric Cooley, male    DOB: [birth year], 80 y.o.   MRN: [id number]  HPI  His symptoms began 3 weeks ago after swimming in a  pool. He described "water" in the right ear. This was associated with some crackling but no apparent hearing loss or tinnitus.  He went swimming last week and symptoms actually got better.  He denies any associated upper respiratory tract infection symptoms.    Review of Systems Frontal headache, facial pain , nasal purulence, dental pain, sore throat , otic pain or otic discharge denied. No fever , chills or sweats.     Objective:   Physical Exam General appearance:good health ;well nourished; no acute distress or increased work of breathing is present.  No  lymphadenopathy about the head, neck, or axilla noted.  Appears MUCH younger than stated age  Eyes: No conjunctival inflammation or lid edema is present. There is no scleral icterus.  Ears:  External ear exam shows no significant lesions or deformities.  Otoscopic examination reveals wax bilaterally ,L > R. Marked hearing loss.  Nose:  External nasal examination shows no deformity or inflammation. Nasal mucosa are pink and moist without lesions or exudates. No septal dislocation or deviation.No obstruction to airflow.   Oral exam: Dental hygiene is good; lips and gums are healthy appearing.There is no oropharyngeal erythema or exudate noted.   Neck:  No deformities, thyromegaly, masses, or tenderness noted.    Extremities:  No cyanosis, edema, or clubbing  noted    Skin: Warm & dry.         Assessment & Plan:  #1 intermittent wax impactions. Majority of wax on R removed with speculum. See AVS

## 2014-05-27 NOTE — Progress Notes (Signed)
Pre visit review using our clinic review tool, if applicable. No additional management support is needed unless otherwise documented below in the visit note. 

## 2014-07-22 ENCOUNTER — Other Ambulatory Visit: Payer: Self-pay

## 2014-08-11 ENCOUNTER — Telehealth: Payer: Self-pay | Admitting: Internal Medicine

## 2014-08-11 NOTE — Telephone Encounter (Signed)
A physical appointment was made for the patient on 11/11/14 at 9:30am

## 2014-11-11 ENCOUNTER — Encounter: Payer: Self-pay | Admitting: Internal Medicine

## 2014-11-11 ENCOUNTER — Ambulatory Visit (INDEPENDENT_AMBULATORY_CARE_PROVIDER_SITE_OTHER): Payer: Medicare Other | Admitting: Internal Medicine

## 2014-11-11 ENCOUNTER — Other Ambulatory Visit (INDEPENDENT_AMBULATORY_CARE_PROVIDER_SITE_OTHER): Payer: Medicare Other

## 2014-11-11 VITALS — BP 148/90 | HR 73 | Temp 97.7°F | Ht 68.0 in | Wt 140.5 lb

## 2014-11-11 DIAGNOSIS — I10 Essential (primary) hypertension: Secondary | ICD-10-CM

## 2014-11-11 DIAGNOSIS — E785 Hyperlipidemia, unspecified: Secondary | ICD-10-CM

## 2014-11-11 DIAGNOSIS — Z0000 Encounter for general adult medical examination without abnormal findings: Secondary | ICD-10-CM

## 2014-11-11 LAB — CBC WITH DIFFERENTIAL/PLATELET
BASOS ABS: 0 10*3/uL (ref 0.0–0.1)
BASOS PCT: 0.6 % (ref 0.0–3.0)
EOS PCT: 1.6 % (ref 0.0–5.0)
Eosinophils Absolute: 0.1 10*3/uL (ref 0.0–0.7)
HEMATOCRIT: 41.8 % (ref 39.0–52.0)
Hemoglobin: 14.3 g/dL (ref 13.0–17.0)
LYMPHS ABS: 1.7 10*3/uL (ref 0.7–4.0)
Lymphocytes Relative: 32 % (ref 12.0–46.0)
MCHC: 34.1 g/dL (ref 30.0–36.0)
MCV: 97.3 fl (ref 78.0–100.0)
MONOS PCT: 9.1 % (ref 3.0–12.0)
Monocytes Absolute: 0.5 10*3/uL (ref 0.1–1.0)
NEUTROS PCT: 56.7 % (ref 43.0–77.0)
Neutro Abs: 2.9 10*3/uL (ref 1.4–7.7)
Platelets: 218 10*3/uL (ref 150.0–400.0)
RBC: 4.3 Mil/uL (ref 4.22–5.81)
RDW: 14.1 % (ref 11.5–15.5)
WBC: 5.2 10*3/uL (ref 4.0–10.5)

## 2014-11-11 LAB — BASIC METABOLIC PANEL
BUN: 27 mg/dL — ABNORMAL HIGH (ref 6–23)
CALCIUM: 10 mg/dL (ref 8.4–10.5)
CO2: 29 mEq/L (ref 19–32)
CREATININE: 1.15 mg/dL (ref 0.40–1.50)
Chloride: 103 mEq/L (ref 96–112)
GFR: 63.14 mL/min (ref >60.00)
GLUCOSE: 86 mg/dL (ref 70–99)
Potassium: 4.1 mEq/L (ref 3.5–5.1)
Sodium: 139 mEq/L (ref 135–145)

## 2014-11-11 LAB — URINALYSIS, ROUTINE W REFLEX MICROSCOPIC
BILIRUBIN URINE: NEGATIVE
Ketones, ur: NEGATIVE
LEUKOCYTES UA: NEGATIVE
Nitrite: NEGATIVE
SPECIFIC GRAVITY, URINE: 1.01 (ref 1.000–1.030)
Total Protein, Urine: NEGATIVE
URINE GLUCOSE: NEGATIVE
UROBILINOGEN UA: 0.2 (ref 0.0–1.0)
pH: 6 (ref 5.0–8.0)

## 2014-11-11 LAB — HEPATIC FUNCTION PANEL
ALBUMIN: 4.2 g/dL (ref 3.5–5.2)
ALK PHOS: 76 U/L (ref 39–117)
ALT: 14 U/L (ref 0–53)
AST: 23 U/L (ref 0–37)
BILIRUBIN TOTAL: 0.5 mg/dL (ref 0.2–1.2)
Bilirubin, Direct: 0.1 mg/dL (ref 0.0–0.3)
Total Protein: 7.5 g/dL (ref 6.0–8.3)

## 2014-11-11 LAB — LIPID PANEL
CHOL/HDL RATIO: 2
Cholesterol: 234 mg/dL — ABNORMAL HIGH (ref 0–200)
HDL: 114.8 mg/dL (ref >39.00)
LDL Cholesterol: 101 mg/dL — ABNORMAL HIGH (ref 0–99)
NonHDL: 119.2
Triglycerides: 93 mg/dL (ref 0.0–149.0)
VLDL: 18.6 mg/dL (ref 0.0–40.0)

## 2014-11-11 LAB — TSH: TSH: 3.56 u[IU]/mL (ref 0.35–4.50)

## 2014-11-11 MED ORDER — SIMVASTATIN 10 MG PO TABS: 10.0000 mg | ORAL_TABLET | Freq: Every day | ORAL | Status: DC

## 2014-11-11 MED ORDER — PANTOPRAZOLE SODIUM 20 MG PO TBEC: 20.0000 mg | DELAYED_RELEASE_TABLET | Freq: Every day | ORAL | Status: DC

## 2014-11-11 MED ORDER — DONEPEZIL HCL 5 MG PO TABS: 5.0000 mg | ORAL_TABLET | Freq: Every day | ORAL | Status: DC

## 2014-11-11 MED ORDER — LISINOPRIL 5 MG PO TABS: 5.0000 mg | ORAL_TABLET | Freq: Every day | ORAL | Status: DC

## 2014-11-11 NOTE — Assessment & Plan Note (Signed)

## 2014-11-11 NOTE — Progress Notes (Signed)
Pre visit review using our clinic review tool, if applicable. No additional management support is needed unless otherwise documented below in the visit note. 

## 2014-11-11 NOTE — Assessment & Plan Note (Signed)
To re-start meds, f/u next visit,  to f/u any worsening symptoms or concerns

## 2014-11-11 NOTE — Progress Notes (Signed)
   Subjective:    Patient ID: Eric Cooley, male    DOB: 1920-06-04, 81 y.o.   MRN: [id number]  HPI    Review of Systems     Objective:   Physical Exam        Assessment & Plan:

## 2014-11-11 NOTE — Patient Instructions (Signed)
Your EKG was OK today  Please continue all other medications as before, and refills have been done if requested of the medications on your current list  (please take all of these)  Please have the pharmacy call with any other refills you may need.  Please continue your efforts at being more active, low cholesterol diet, and weight control.  You are otherwise up to date with prevention measures today.  Please keep your appointments with your specialists as you may have planned  Please go to the LAB in the Basement (turn left off the elevator) for the tests to be done today  You will be contacted by phone if any changes need to be made immediately.  Otherwise, you will receive a letter about your results with an explanation, but please check with MyChart first.  Please remember to sign up for MyChart if you have not done so, as this will be important to you in the future with finding out test results, communicating by private email, and scheduling acute appointments online when needed.  Please return in 6 months, or sooner if needed

## 2014-11-11 NOTE — Progress Notes (Signed)
Subjective:    Patient ID: Eric Cooley, male    DOB: Oct 14, 1919, 81 y.o.   MRN: [id number]  HPI  Here for wellness and f/u;  Overall doing ok;  Pt denies CP, worsening SOB, DOE, wheezing, orthopnea, PND, worsening LE edema, palpitations, dizziness or syncope.  Pt denies neurological change such as new headache, facial or extremity weakness.  Pt denies polydipsia, polyuria, or low sugar symptoms.  has been trying to follow lower cholesterol diet.  Pt denies worsening depressive symptoms, suicidal ideation or panic. No fever, night sweats, wt loss, loss of appetite, or other constitutional symptoms.  Pt states good ability with ADL's, has low fall risk, home safety reviewed and adequate, no other significant changes in hearing or vision, and only occasionally active with exercise. Did have some shoulder pain no improved. Pt continues to have recurring LBP without change in severity, bowel or bladder change, fever, wt loss,  worsening LE pain/numbness/weakness, gait change or falls , but the back pain is now about his most pressing significant problem, not expected to improve  Was suggested by insurance nurse he might have migraine HA, but described neck and head pain. Admits to not taking of his med recetnly as felt no different not taking them, and wanted advice today on which ones to consider re-start . stattes some med seemed to cause mucous in the mouth Has been out of his PPI, + worsening reflux, but no abd pain, dysphagia, n/v, bowel change or blood. Past Medical History  Diagnosis Date  . Hypertension   . MI (myocardial infarction)   . 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 CAD (coronary artery disease) 03/29/2011  . HTN (hypertension) 03/29/2011  . Hyperlipidemia 03/29/2011  . BPH (benign prostatic hypertrophy) 03/29/2011  . GERD (gastroesophageal reflux disease) 03/29/2011  . S/P cholecystectomy 03/29/2011  . 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  Thrombocytopenia 03/29/2011  . Dizziness - giddy 03/29/2011  . Allergic rhinitis, cause unspecified 03/29/2011  . S/P partial gastrectomy 03/29/2011  . Anemia 03/29/2011  . Arthritis   . Ulcer   . Urine incontinence   . Low back pain   . DJD (degenerative joint disease), lumbar 04/01/2011  . Prostate cancer 03/29/2011  . History of nuclear stress test 03/27/2010    exercise; no ischemic changes, normal study    Past Surgical History  Procedure Laterality Date  . Coronary angioplasty with stent placement  04/24/2009    2 Xience DES to LAD 3.5x67mm and 3.0x48mm overlapping  . Tonsillectomy  1951  . Cholecystectomy  1986  . 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 Transthoracic echocardiogram  10/09/2009    EF 55-60%; normaml LV systolic function with grade 1 diastolic dysfunction    reports that he quit smoking about 54 years ago. He has never used smokeless tobacco. He reports that he drinks about 0.6 oz of alcohol per week. He reports that he does not use illicit drugs. family history includes Arthritis in his father and mother; Heart failure in his mother; Lung cancer in his father; Ovarian cancer in his mother. There is no history of Colon cancer. No Known Allergies Current Outpatient Prescriptions on File Prior to Visit  Medication Sig Dispense Refill  . acetaminophen (TYLENOL) 500 MG tablet Take 500 mg by mouth every 6 (six) hours as needed for pain.    Marland Kitchen aspirin 81 MG EC tablet Take 1 tablet (81 mg total) by mouth daily. (  Patient not taking: Reported on 11/11/2014) 30 tablet 12  . Docusate Sodium (DSS) 100 MG CAPS Take 100 mg by mouth 2 (two) times daily. (Patient not taking: Reported on 11/11/2014) 60 each 5  . donepezil (ARICEPT) 5 MG tablet Take 1 tablet (5 mg total) by mouth at bedtime. (Patient not taking: Reported on 11/11/2014) 90 tablet 3  . lisinopril (PRINIVIL,ZESTRIL) 5 MG tablet Take 1 tablet (5 mg total) by mouth daily. (Patient not taking: Reported on  11/11/2014) 30 tablet 5  . Multiple Vitamins-Minerals (ICAPS) CAPS Take 1 capsule by mouth daily.     Marland Kitchen NITROSTAT 0.4 MG SL tablet place 1 tablet under the tongue every 5 minutes for 3 doses if needed for chest pain (Patient not taking: Reported on 11/11/2014) 25 tablet 2  . pantoprazole (PROTONIX) 20 MG tablet Take 1 tablet (20 mg total) by mouth daily. (Patient not taking: Reported on 11/11/2014) 30 tablet 11  . polyethylene glycol (MIRALAX / GLYCOLAX) packet Take 17 g by mouth daily as needed. Constipation    . simvastatin (ZOCOR) 10 MG tablet Take 1 tablet (10 mg total) by mouth daily. (Patient not taking: Reported on 11/11/2014) 30 tablet 11  . Tamsulosin HCl (FLOMAX) 0.4 MG CAPS Take by mouth daily.      . [DISCONTINUED] fluticasone (FLONASE) 50 MCG/ACT nasal spray Place 2 sprays into the nose daily.       No current facility-administered medications on file prior to visit.   Review of Systems Constitutional: Negative for increased diaphoresis, other activity, appetite or other siginficant weight change  HENT: Negative for worsening hearing loss, ear pain, facial swelling, mouth sores and neck stiffness.   Eyes: Negative for other worsening pain, redness or visual disturbance.  Respiratory: Negative for shortness of breath and wheezing.   Cardiovascular: Negative for chest pain and palpitations.  Gastrointestinal: Negative for diarrhea, blood in stool, abdominal distention or other pain Genitourinary: Negative for hematuria, flank pain or change in urine volume.  Musculoskeletal: Negative for myalgias or other joint complaints.  Skin: Negative for color change and wound.  Neurological: Negative for syncope and numbness. other than noted Hematological: Negative for adenopathy. or other swelling Psychiatric/Behavioral: Negative for hallucinations, self-injury, decreased concentration or other worsening agitation.      Objective:   Physical Exam BP 148/90 mmHg  Pulse 73  Temp(Src) 97.7 F  (36.5 C) (Oral)  Ht 5\' 8"  (1.727 m)  Wt 140 lb 8 oz (63.73 kg)  BMI 21.37 kg/m2  SpO2 94% VS noted,  Constitutional: Pt is oriented to person, place, and time. Appears well-developed and well-nourished.  Head: Normocephalic and atraumatic.  Right Ear: External ear normal.  Left Ear: External ear normal.  Nose: Nose normal.  Mouth/Throat: Oropharynx is clear and moist.  Eyes: Conjunctivae and EOM are normal. Pupils are equal, round, and reactive to light.  Neck: Normal range of motion. Neck supple. No JVD present. No tracheal deviation present.  Cardiovascular: Normal rate, regular rhythm, normal heart sounds and intact distal pulses.   Pulmonary/Chest: Effort normal and breath sounds without rales or wheezing  Abdominal: Soft. Bowel sounds are normal. NT. No HSM  Musculoskeletal: Normal range of motion. Exhibits no edema.  Lymphadenopathy:  Has no cervical adenopathy.  Neurological: Pt is alert and oriented to person, place, and time. Pt has normal reflexes. No cranial nerve deficit. Motor grossly intact Skin: Skin is warm and dry. No rash noted.  Psychiatric:  Has normal mood and affect. Behavior is normal. except mild nervous  Assessment & Plan:

## 2014-12-01 ENCOUNTER — Telehealth: Payer: Self-pay | Admitting: Internal Medicine

## 2014-12-01 NOTE — Telephone Encounter (Signed)
PLEASE NOTE: All timestamps contained within this report are represented as [place] Standard Time. CONFIDENTIALTY NOTICE: This fax transmission is intended only for the addressee. It contains information that is legally privileged, confidential or otherwise protected from use or disclosure. If you are not the intended recipient, you are strictly prohibited from reviewing, disclosing, copying using or disseminating any of this information or taking any action in reliance on or regarding this information. If you have received this fax in error, please notify us immediately by telephone so that we can arrange for its return to [place]. Phone: [phone], Toll-Free: [phone], Fax: [phone] Page: 1 of 1 Call Id: [id number] [facility] - Client [place] Patient Name: Eric Cooley DOB: 10/02/1920 Initial Comment Caller states he has been having dizzy spells along with neck soreness for the past 3 days. Nurse Assessment Nurse: Loletta Specter, RN, Wells Guiles Date/Time Eilene Ghazi Time): 12/01/2014 3:31:59 PM Confirm and document reason for call. If symptomatic, describe symptoms. ---Caller states he has dizziness when walking and neck pain. Has the patient traveled out of the country within the last 30 days? ---Not Applicable Does the patient require triage? ---Yes Related visit to physician within the last 2 weeks? ---No Does the PT have any chronic conditions? (i.e. diabetes, asthma, etc.) ---Yes List chronic conditions. ---chrronic back pain. Guidelines Guideline Title Affirmed Question Affirmed Notes Dizziness - Lightheadedness [1] MODERATE dizziness (e.g., interferes with normal activities) AND [2] has NOT been evaluated by physician for this (Exception: dizziness caused by heat exposure, sudden standing, or poor fluid intake) Final Disposition User See Physician within [facility], Therapist, sports, Wells Guiles

## 2014-12-02 ENCOUNTER — Encounter: Payer: Self-pay | Admitting: Internal Medicine

## 2014-12-02 ENCOUNTER — Telehealth: Payer: Self-pay | Admitting: *Deleted

## 2014-12-02 ENCOUNTER — Ambulatory Visit (INDEPENDENT_AMBULATORY_CARE_PROVIDER_SITE_OTHER): Payer: Medicare Other | Admitting: Internal Medicine

## 2014-12-02 ENCOUNTER — Ambulatory Visit (INDEPENDENT_AMBULATORY_CARE_PROVIDER_SITE_OTHER)
Admission: RE | Admit: 2014-12-02 | Discharge: 2014-12-02 | Disposition: A | Discharge status: Home / Self Care | Payer: Medicare Other | Source: Ambulatory Visit | Attending: Internal Medicine | Admitting: Internal Medicine

## 2014-12-02 VITALS — BP 120/84 | HR 63 | Temp 97.6°F | Resp 18 | Ht 68.0 in | Wt 131.0 lb

## 2014-12-02 DIAGNOSIS — J069 Acute upper respiratory infection, unspecified: Secondary | ICD-10-CM

## 2014-12-02 DIAGNOSIS — R05 Cough: Secondary | ICD-10-CM

## 2014-12-02 DIAGNOSIS — R059 Cough, unspecified: Secondary | ICD-10-CM

## 2014-12-02 DIAGNOSIS — M542 Cervicalgia: Secondary | ICD-10-CM

## 2014-12-02 DIAGNOSIS — R2681 Unsteadiness on feet: Secondary | ICD-10-CM

## 2014-12-02 MED ORDER — TRAMADOL HCL 50 MG PO TABS: 50.0000 mg | ORAL_TABLET | Freq: Four times a day (QID) | ORAL | Status: DC | PRN

## 2014-12-02 MED ORDER — LEVOFLOXACIN 250 MG PO TABS: 250.0000 mg | ORAL_TABLET | Freq: Every day | ORAL | Status: DC

## 2014-12-02 NOTE — Patient Instructions (Signed)
Please take all new medication as prescribed - the antibiotic, and tramadol for pain if needed  You can also take Tylenol 8 hr - 1 every 8 hrs if needed  You should walk with a cane if and when your strength returns for safety against falls  Please continue all other medications as before, and refills have been done if requested.  Please have the pharmacy call with any other refills you may need.  Please keep your appointments with your specialists as you may have planned  Please go to the XRAY Department in the Basement (go straight as you get off the elevator) for the x-ray testing  You will be contacted by phone if any changes need to be made immediately.  Otherwise, you will receive a letter about your results with an explanation, but please check with MyChart first.

## 2014-12-02 NOTE — Assessment & Plan Note (Signed)
Mild to mod, for antibx course,  to f/u any worsening symptoms or concerns 

## 2014-12-02 NOTE — Assessment & Plan Note (Signed)
Also for cxr, r/o pna °

## 2014-12-02 NOTE — Progress Notes (Signed)
Subjective:    Patient ID: Eric Cooley, male    DOB: 09/03/1920, 81 y.o.   MRN: [id number]  HPI  Her e with daughter, c/o HA, severe 9/10 anteiror neck pain, feverish, general weakness, dizziness and unsteady to walk holding on to furniture for safety, x 4-5 days, after visited by 38yo grandson who seemed to have some type of URI just prior to onset of his symptoms.  Has occas nonprod cough, non today, no chills and Pt denies chest pain, increased sob or doe, wheezing, orthopnea, PND, increased LE swelling, palpitations, or syncope. No falls. Has a cane available at home, did not think to use it today Past Medical History  Diagnosis Date  . Hypertension   . MI (myocardial infarction)   . 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 CAD (coronary artery disease) 03/29/2011  . HTN (hypertension) 03/29/2011  . Hyperlipidemia 03/29/2011  . BPH (benign prostatic hypertrophy) 03/29/2011  . GERD (gastroesophageal reflux disease) 03/29/2011  . S/P cholecystectomy 03/29/2011  . 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 Thrombocytopenia 03/29/2011  . Dizziness - giddy 03/29/2011  . Allergic rhinitis, cause unspecified 03/29/2011  . S/P partial gastrectomy 03/29/2011  . Anemia 03/29/2011  . Arthritis   . Ulcer   . Urine incontinence   . Low back pain   . DJD (degenerative joint disease), lumbar 04/01/2011  . Prostate cancer 03/29/2011  . History of nuclear stress test 03/27/2010    exercise; no ischemic changes, normal study    Past Surgical History  Procedure Laterality Date  . Coronary angioplasty with stent placement  04/24/2009    2 Xience DES to LAD 3.5x61mm and 3.0x79mm overlapping  . Tonsillectomy  1951  . Cholecystectomy  1986  . 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 Transthoracic echocardiogram  10/09/2009    EF 55-60%; normaml LV systolic function with grade 1 diastolic dysfunction    reports that he quit smoking about 54 years ago.  He has never used smokeless tobacco. He reports that he drinks about 0.6 oz of alcohol per week. He reports that he does not use illicit drugs. family history includes Arthritis in his father and mother; Heart failure in his mother; Lung cancer in his father; Ovarian cancer in his mother. There is no history of Colon cancer. No Known Allergies Current Outpatient Prescriptions on File Prior to Visit  Medication Sig Dispense Refill  . acetaminophen (TYLENOL) 500 MG tablet Take 500 mg by mouth every 6 (six) hours as needed for pain.    Marland Kitchen aspirin 81 MG EC tablet Take 1 tablet (81 mg total) by mouth daily. 30 tablet 12  . donepezil (ARICEPT) 5 MG tablet Take 1 tablet (5 mg total) by mouth at bedtime. 90 tablet 3  . lisinopril (PRINIVIL,ZESTRIL) 5 MG tablet Take 1 tablet (5 mg total) by mouth daily. 90 tablet 3  . pantoprazole (PROTONIX) 20 MG tablet Take 1 tablet (20 mg total) by mouth daily. 90 tablet 3  . simvastatin (ZOCOR) 10 MG tablet Take 1 tablet (10 mg total) by mouth daily. 90 tablet 3  . Tamsulosin HCl (FLOMAX) 0.4 MG CAPS Take by mouth daily.      . [DISCONTINUED] fluticasone (FLONASE) 50 MCG/ACT nasal spray Place 2 sprays into the nose daily.       No current facility-administered medications on file prior to visit.   Review of Systems  Constitutional: Negative for unusual diaphoresis or other sweats  HENT: Negative for ringing in ear Eyes: Negative for double vision or worsening visual disturbance.  Respiratory: Negative for choking and stridor.   Gastrointestinal: Negative for vomiting or other signifcant bowel change Genitourinary: Negative for hematuria or decreased urine volume.  Musculoskeletal: Negative for other MSK pain or swelling Skin: Negative for color change and worsening wound.  Neurological: Negative for tremors and numbness other than noted  Psychiatric/Behavioral: Negative for decreased concentration or agitation other than above       Objective:   Physical  Exam BP 120/84 mmHg  Pulse 63  Temp(Src) 97.6 F (36.4 C) (Oral)  Resp 18  Ht 5\' 8"  (1.727 m)  Wt 131 lb 0.6 oz (59.439 kg)  BMI 19.93 kg/m2  SpO2 96% VS noted, frail  Constitutional: Pt appears elderly, frail, weakened, unsteady on feet but able to stand from sitting and get up on exam table with minimal assist, holds on to wall and furniture to avoid falling HENT: Head: NCAT.  Right Ear: External ear normal.  Left Ear: External ear normal.  Eyes: . Pupils are equal, round, and reactive to light. Conjunctivae and EOM are normal Neck: Normal range of motion. Neck supple. With bilat submandib LA tender  Cardiovascular: Normal rate and regular rhythm.   Pulmonary/Chest: Effort normal and breath sounds decresaed without rales or wheezing.  Abd:  Soft, NT, ND, + BS Neurological: Pt is alert. Not confused , motor grossly intact, gait unsteady as above Skin: Skin is warm. No rash Psychiatric: Pt behavior is normal. No agitation.     Assessment & Plan:

## 2014-12-02 NOTE — Assessment & Plan Note (Signed)
To walk with cane for safety until stamina returns

## 2014-12-02 NOTE — Assessment & Plan Note (Signed)
Rather dramatic and his biggest complaint, for cxr , also tramadol/tylenol  For pain prn,  to f/u any worsening symptoms or concerns

## 2014-12-02 NOTE — Progress Notes (Signed)
Pre visit review using our clinic review tool, if applicable. No additional management support is needed unless otherwise documented below in the visit note. 

## 2014-12-02 NOTE — Telephone Encounter (Signed)
[facility] Patient Name: Eric Cooley Gender: Male DOB: June 03, 1920 Age: 81 Y 45 M 5 D Return Phone Number: [phone] (Primary), [phone] (Secondary) Address: [address] City/State/Zip: [place] [address] Client [facility] - Client Client [facility] - Day Physician Cathlean Cower Contact Type Call Call Type Triage / Clinical Relationship To Patient Self Appointment Disposition EMR Appointment Scheduled Return Phone Number [phone] (Primary) Chief Complaint Dizziness Initial Comment Caller states he has been having dizzy spells along with neck soreness for the past 3 days. PreDisposition Did not know what to do Info pasted into Epic Yes Nurse Assessment Nurse: Loletta Specter, RN, Wells Guiles Date/Time Eilene Ghazi Time): 12/01/2014 3:31:59 PM Confirm and document reason for call. If symptomatic, describe symptoms. ---Caller states he has dizziness when walking and neck pain. Has the patient traveled out of the country within the last 30 days? ---Not Applicable Does the patient require triage? ---Yes Related visit to physician within the last 2 weeks? ---No Does the PT have any chronic conditions? (i.e. diabetes, asthma, etc.) ---Yes List chronic conditions. ---chrronic back pain. Guidelines Guideline Title Affirmed Question Affirmed Notes Nurse Date/Time (Eastern Time) Dizziness - Lightheadedness [1] MODERATE dizziness (e.g., interferes with normal activities) AND [2] has NOT been evaluated by physician for this (Exception: dizziness caused by heat exposure, sudden standing, or poor fluid intake) Loletta Specter, RN, Wells Guiles 12/01/2014 3:32:43 PM Disp. Time Eilene Ghazi Time) Disposition Final User 12/01/2014 3:41:58 PM See Physician within 24 Hours Yes Loletta Specter, RN, Wells Guiles PLEASE NOTE: All timestamps contained within this report are represented as [place]  Standard Time. CONFIDENTIALTY NOTICE: This fax transmission is intended only for the addressee. It contains information that is legally privileged, confidential or otherwise protected from use or disclosure. If you are not the intended recipient, you are strictly prohibited from reviewing, disclosing, copying using or disseminating any of this information or taking any action in reliance on or regarding this information. If you have received this fax in error, please notify us immediately by telephone so that we can arrange for its return to [place]. Phone: [phone], Toll-Free: [phone], Fax: [phone] Page: 2 of 2 Call Id: [id number] Normal Understands: Yes Disagree/Comply: Comply

## 2014-12-06 ENCOUNTER — Encounter: Payer: Self-pay | Admitting: Internal Medicine

## 2014-12-16 ENCOUNTER — Ambulatory Visit (INDEPENDENT_AMBULATORY_CARE_PROVIDER_SITE_OTHER)
Admission: RE | Admit: 2014-12-16 | Discharge: 2014-12-16 | Disposition: A | Discharge status: Home / Self Care | Payer: Medicare Other | Source: Ambulatory Visit | Attending: Internal Medicine | Admitting: Internal Medicine

## 2014-12-16 ENCOUNTER — Encounter: Payer: Self-pay | Admitting: Internal Medicine

## 2014-12-16 ENCOUNTER — Ambulatory Visit (INDEPENDENT_AMBULATORY_CARE_PROVIDER_SITE_OTHER): Payer: Medicare Other | Admitting: Internal Medicine

## 2014-12-16 VITALS — BP 118/70 | HR 63 | Temp 98.7°F | Resp 18 | Ht 68.0 in | Wt 136.0 lb

## 2014-12-16 DIAGNOSIS — R9389 Abnormal findings on diagnostic imaging of other specified body structures: Secondary | ICD-10-CM | POA: Insufficient documentation

## 2014-12-16 DIAGNOSIS — G8929 Other chronic pain: Secondary | ICD-10-CM | POA: Insufficient documentation

## 2014-12-16 DIAGNOSIS — M549 Dorsalgia, unspecified: Secondary | ICD-10-CM

## 2014-12-16 DIAGNOSIS — R938 Abnormal findings on diagnostic imaging of other specified body structures: Secondary | ICD-10-CM

## 2014-12-16 NOTE — Patient Instructions (Addendum)
Please continue all other medications as before, and refills have been done if requested.  Please have the pharmacy call with any other refills you may need.  Please keep your appointments with your specialists as you may have planned  You will be contacted regarding the referral for: pain management  Please go to the XRAY Department in the Basement (go straight as you get off the elevator) for the x-ray testing  You will be contacted by phone if any changes need to be made immediately.  Otherwise, you will receive a letter about your results with an explanation, but please check with MyChart first.  Please remember to sign up for MyChart if you have not done so, as this will be important to you in the future with finding out test results, communicating by private email, and scheduling acute appointments online when needed.

## 2014-12-16 NOTE — Assessment & Plan Note (Signed)
For repeat cxr today with nipple markers, consider CT if not signficant improved and/or pulm referal, pt aware

## 2014-12-16 NOTE — Progress Notes (Signed)
Pre visit review using our clinic review tool, if applicable. No additional management support is needed unless otherwise documented below in the visit note. 

## 2014-12-16 NOTE — Progress Notes (Signed)
Subjective:    Patient ID: Eric Cooley, male    DOB: [birth year], 81 y.o.   MRN: [id number]  HPI  Here to f/u abnormal cxr, pt seen 2 wks ago, found to RML infiltrate on cxr, tx with antibx course, states did not actually finish - short by a few days as he wasn't sure it was helping, but today does say he at least feels somewhat improved without chest pain, increased sob or doe, wheezing, orthopnea, PND, increased LE swelling, palpitations, dizziness or syncope. No fever or cough.  There was also meniton of ? Nipple shadow vs nodule - needs repeat exam. Pt continues to have recurring high lumbar LBP, bowel or bladder change, fever, wt loss,  worsening LE pain/numbness/weakness, gait change or falls, has not tried the tylenol 8 hr, asks for pain management referral since has seen ortho for this approx 1 yr ago  Also last visit neck pain and URi symtpoms resolved.  Past Medical History  Diagnosis Date  . Hypertension   . MI (myocardial infarction)   . 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 CAD (coronary artery disease) 03/29/2011  . HTN (hypertension) 03/29/2011  . Hyperlipidemia 03/29/2011  . BPH (benign prostatic hypertrophy) 03/29/2011  . GERD (gastroesophageal reflux disease) 03/29/2011  . S/P cholecystectomy 03/29/2011  . 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 Thrombocytopenia 03/29/2011  . Dizziness - giddy 03/29/2011  . Allergic rhinitis, cause unspecified 03/29/2011  . S/P partial gastrectomy 03/29/2011  . Anemia 03/29/2011  . Arthritis   . Ulcer   . Urine incontinence   . Low back pain   . DJD (degenerative joint disease), lumbar 04/01/2011  . Prostate cancer 03/29/2011  . History of nuclear stress test 03/27/2010    exercise; no ischemic changes, normal study    Past Surgical History  Procedure Laterality Date  . Coronary angioplasty with stent placement  04/24/2009    2 Xience DES to LAD 3.5x59mm and 3.0x30mm overlapping  . Tonsillectomy   1951  . Cholecystectomy  1986  . 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 Transthoracic echocardiogram  10/09/2009    EF 55-60%; normaml LV systolic function with grade 1 diastolic dysfunction    reports that he quit smoking about 54 years ago. He has never used smokeless tobacco. He reports that he drinks about 0.6 oz of alcohol per week. He reports that he does not use illicit drugs. family history includes Arthritis in his father and mother; Heart failure in his mother; Lung cancer in his father; Ovarian cancer in his mother. There is no history of Colon cancer. No Known Allergies Current Outpatient Prescriptions on File Prior to Visit  Medication Sig Dispense Refill  . acetaminophen (TYLENOL) 500 MG tablet Take 500 mg by mouth every 6 (six) hours as needed for pain.    Marland Kitchen aspirin 81 MG EC tablet Take 1 tablet (81 mg total) by mouth daily. 30 tablet 12  . donepezil (ARICEPT) 5 MG tablet Take 1 tablet (5 mg total) by mouth at bedtime. 90 tablet 3  . levofloxacin (LEVAQUIN) 250 MG tablet Take 1 tablet (250 mg total) by mouth daily. 10 tablet 0  . lisinopril (PRINIVIL,ZESTRIL) 5 MG tablet Take 1 tablet (5 mg total) by mouth daily. 90 tablet 3  . pantoprazole (PROTONIX) 20 MG tablet Take 1 tablet (20 mg total) by mouth daily. 90 tablet 3  . simvastatin (ZOCOR) 10 MG tablet Take 1 tablet (10  mg total) by mouth daily. 90 tablet 3  . Tamsulosin HCl (FLOMAX) 0.4 MG CAPS Take by mouth daily.      . traMADol (ULTRAM) 50 MG tablet Take 1 tablet (50 mg total) by mouth every 6 (six) hours as needed. 60 tablet 0  . [DISCONTINUED] fluticasone (FLONASE) 50 MCG/ACT nasal spray Place 2 sprays into the nose daily.       No current facility-administered medications on file prior to visit.   Review of Systems  Constitutional: Negative for unusual diaphoresis or night sweats HENT: Negative for ringing in ear or discharge Eyes: Negative for double vision or worsening visual disturbance.    Respiratory: Negative for choking and stridor.   Gastrointestinal: Negative for vomiting or other signifcant bowel change Genitourinary: Negative for hematuria or change in urine volume.  Musculoskeletal: Negative for other MSK pain or swelling Skin: Negative for color change and worsening wound.  Neurological: Negative for tremors and numbness other than noted  Psychiatric/Behavioral: Negative for decreased concentration or agitation other than above       Objective:   Physical Exam BP 118/70 mmHg  Pulse 63  Temp(Src) 98.7 F (37.1 C) (Oral)  Resp 18  Ht 5\' 8"  (1.727 m)  Wt 136 lb (61.689 kg)  BMI 20.68 kg/m2  SpO2 96% VS noted,  Constitutional: Pt appears in no significant distress HENT: Head: NCAT.  Right Ear: External ear normal.  Left Ear: External ear normal.  Eyes: . Pupils are equal, round, and reactive to light. Conjunctivae and EOM are normal Neck: Normal range of motion. Neck supple.  Cardiovascular: Normal rate and regular rhythm.   Pulmonary/Chest: Effort normal and breath sounds somewhat decrased on right without rales or wheezing.  Marked scoliosis, non tender spine Neurological: Pt is alert. Not confused , motor grossly intact Skin: Skin is warm. No rash, no LE edema Psychiatric: Pt behavior is normal. No agitation.      Assessment & Plan:

## 2015-02-01 ENCOUNTER — Ambulatory Visit (INDEPENDENT_AMBULATORY_CARE_PROVIDER_SITE_OTHER)
Admission: RE | Admit: 2015-02-01 | Discharge: 2015-02-01 | Disposition: A | Discharge status: Home / Self Care | Payer: Medicare Other | Source: Ambulatory Visit | Attending: Internal Medicine | Admitting: Internal Medicine

## 2015-02-01 ENCOUNTER — Ambulatory Visit (INDEPENDENT_AMBULATORY_CARE_PROVIDER_SITE_OTHER): Payer: Medicare Other | Admitting: Internal Medicine

## 2015-02-01 ENCOUNTER — Encounter: Payer: Self-pay | Admitting: Internal Medicine

## 2015-02-01 VITALS — BP 118/68 | HR 78 | Temp 97.6°F | Resp 16 | Ht 68.0 in | Wt 136.0 lb

## 2015-02-01 DIAGNOSIS — J309 Allergic rhinitis, unspecified: Secondary | ICD-10-CM | POA: Diagnosis not present

## 2015-02-01 DIAGNOSIS — R0781 Pleurodynia: Secondary | ICD-10-CM | POA: Insufficient documentation

## 2015-02-01 DIAGNOSIS — M25512 Pain in left shoulder: Secondary | ICD-10-CM

## 2015-02-01 DIAGNOSIS — I10 Essential (primary) hypertension: Secondary | ICD-10-CM

## 2015-02-01 MED ORDER — CETIRIZINE HCL 10 MG PO TABS: 10.0000 mg | ORAL_TABLET | Freq: Every day | ORAL | Status: DC

## 2015-02-01 MED ORDER — TRIAMCINOLONE ACETONIDE 55 MCG/ACT NA AERO: 2.0000 | INHALATION_SPRAY | Freq: Every day | NASAL | Status: DC

## 2015-02-01 MED ORDER — TRAMADOL HCL 50 MG PO TABS: 50.0000 mg | ORAL_TABLET | Freq: Four times a day (QID) | ORAL | Status: DC | PRN

## 2015-02-01 MED ORDER — METHYLPREDNISOLONE ACETATE 80 MG/ML IJ SUSP
80.0000 mg | Freq: Once | INTRAMUSCULAR | Status: AC
  Administered 2015-02-01: 80 mg via INTRAMUSCULAR

## 2015-02-01 NOTE — Progress Notes (Signed)
Subjective:    Patient ID: Eric Cooley, male    DOB: [birth year], 81 y.o.   MRN: [id number]  HPI  Here post fall 2.5 wks ago, was moving some books, got off balance and fell to left hitting left shoudler on door frame.  Had immed sharp pain but did not feel need for attention, alleve helped but has gotten worse in severity , now unable to abduct as well, and with even more pain  Also has left lateral chest pain where arm hit the left chest as well, pleuritic, sharp, mild tender, no swelling or rash, is nonpositional and nonexertional. Does have several wks ongoing nasal allergy symptoms with clearish congestion, itch and sneezing, without fever, pain, ST, cough, swelling or wheezing. Past Medical History  Diagnosis Date  . Hypertension   . MI (myocardial infarction)   . 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 CAD (coronary artery disease) 03/29/2011  . HTN (hypertension) 03/29/2011  . Hyperlipidemia 03/29/2011  . BPH (benign prostatic hypertrophy) 03/29/2011  . GERD (gastroesophageal reflux disease) 03/29/2011  . S/P cholecystectomy 03/29/2011  . 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 Thrombocytopenia 03/29/2011  . Dizziness - giddy 03/29/2011  . Allergic rhinitis, cause unspecified 03/29/2011  . S/P partial gastrectomy 03/29/2011  . Anemia 03/29/2011  . Arthritis   . Ulcer   . Urine incontinence   . Low back pain   . DJD (degenerative joint disease), lumbar 04/01/2011  . Prostate cancer 03/29/2011  . History of nuclear stress test 03/27/2010    exercise; no ischemic changes, normal study    Past Surgical History  Procedure Laterality Date  . Coronary angioplasty with stent placement  04/24/2009    2 Xience DES to LAD 3.5x80mm and 3.0x90mm overlapping  . Tonsillectomy  1951  . Cholecystectomy  1986  . 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 Transthoracic echocardiogram  10/09/2009    EF 55-60%; normaml LV systolic function with  grade 1 diastolic dysfunction    reports that he quit smoking about 54 years ago. He has never used smokeless tobacco. He reports that he drinks about 0.6 oz of alcohol per week. He reports that he does not use illicit drugs. family history includes Arthritis in his father and mother; Heart failure in his mother; Lung cancer in his father; Ovarian cancer in his mother. There is no history of Colon cancer. No Known Allergies Current Outpatient Prescriptions on File Prior to Visit  Medication Sig Dispense Refill  . acetaminophen (TYLENOL) 500 MG tablet Take 500 mg by mouth every 6 (six) hours as needed for pain.    Marland Kitchen aspirin 81 MG EC tablet Take 1 tablet (81 mg total) by mouth daily. 30 tablet 12  . donepezil (ARICEPT) 5 MG tablet Take 1 tablet (5 mg total) by mouth at bedtime. 90 tablet 3  . levofloxacin (LEVAQUIN) 250 MG tablet Take 1 tablet (250 mg total) by mouth daily. 10 tablet 0  . lisinopril (PRINIVIL,ZESTRIL) 5 MG tablet Take 1 tablet (5 mg total) by mouth daily. 90 tablet 3  . pantoprazole (PROTONIX) 20 MG tablet Take 1 tablet (20 mg total) by mouth daily. 90 tablet 3  . simvastatin (ZOCOR) 10 MG tablet Take 1 tablet (10 mg total) by mouth daily. 90 tablet 3  . Tamsulosin HCl (FLOMAX) 0.4 MG CAPS Take by mouth daily.      . [DISCONTINUED] fluticasone (FLONASE) 50 MCG/ACT nasal spray Place 2 sprays  into the nose daily.       No current facility-administered medications on file prior to visit.   Review of Systems  Constitutional: Negative for unusual diaphoresis or night sweats HENT: Negative for ringing in ear or discharge Eyes: Negative for double vision or worsening visual disturbance.  Respiratory: Negative for choking and stridor.   Gastrointestinal: Negative for vomiting or other signifcant bowel change Genitourinary: Negative for hematuria or change in urine volume.  Musculoskeletal: Negative for other MSK pain or swelling Skin: Negative for color change and worsening wound.    Neurological: Negative for tremors and numbness other than noted  Psychiatric/Behavioral: Negative for decreased concentration or agitation other than above       Objective:   Physical Exam BP 118/68 mmHg  Pulse 78  Temp(Src) 97.6 F (36.4 C) (Oral)  Resp 16  Ht 5\' 8"  (1.727 m)  Wt 136 lb (61.689 kg)  BMI 20.68 kg/m2  SpO2 94% VS noted, not ill appearing Constitutional: Pt appears in no significant distress HENT: Head: NCAT.  Right Ear: External ear normal.  Left Ear: External ear normal.  Eyes: . Pupils are equal, round, and reactive to light. Conjunctivae and EOM are normal Spine nontender Bilat tm's with mild erythema.  Max sinus areas non tender.  Pharynx with mild erythema, no exudate Neck: Normal range of motion. Neck supple.  Left shoulder with anterior tenderness, and pain with abduction to 90 degrees only.   Tender area left lateral chest wall noted - about t6 anterior axillary line Cardiovascular: Normal rate and regular rhythm.   Pulmonary/Chest: Effort normal and breath sounds without rales or wheezing.  Abd:  Soft, NT, ND, + BS Neurological: Pt is alert. Not confused , motor grossly intact Skin: Skin is warm. No rash, no LE edema Psychiatric: Pt behavior is normal. No agitation.     Assessment & Plan:

## 2015-02-01 NOTE — Patient Instructions (Signed)
You had the steroid shot today  Please take all new medication as prescribed  - the zyrtec and nasacort for allergies  Please continue all other medications as before, and refills have been done if requested - the tramadol for pain  Please have the pharmacy call with any other refills you may need.  Please keep your appointments with your specialists as you may have planned  Please go to the XRAY Department in the Basement (go straight as you get off the elevator) for the x-ray testing  You will be contacted by phone if any changes need to be made immediately.  Otherwise, you will receive a letter about your results with an explanation, but please check with MyChart first.  Please remember to sign up for MyChart if you have not done so, as this will be important to you in the future with finding out test results, communicating by private email, and scheduling acute appointments online when needed.

## 2015-02-04 NOTE — Assessment & Plan Note (Signed)
?   fx vs soft tissue injury - for xray, tramadol pain control, consider f/u with Dr Sluder/sport medicine

## 2015-02-04 NOTE — Assessment & Plan Note (Signed)
Likely msk injury, cant r/o rib fx, for rib film/cxr, pain control,  to f/u any worsening symptoms or concerns

## 2015-02-04 NOTE — Assessment & Plan Note (Signed)
stable overall by history and exam, recent data reviewed with pt, and pt to continue medical treatment as before,  to f/u any worsening symptoms or concerns BP Readings from Last 3 Encounters:  02/01/15 118/68  12/16/14 118/70  12/02/14 120/84

## 2015-02-04 NOTE — Assessment & Plan Note (Signed)
Mild to mod, for zyrted and nasacort asd,  to f/u any worsening symptoms or concerns

## 2015-02-15 ENCOUNTER — Encounter: Payer: Self-pay | Admitting: Physical Medicine & Rehabilitation

## 2015-02-24 ENCOUNTER — Ambulatory Visit: Payer: Medicare Other | Admitting: Physical Medicine & Rehabilitation

## 2015-03-10 ENCOUNTER — Telehealth: Payer: Self-pay | Admitting: Internal Medicine

## 2015-03-10 ENCOUNTER — Emergency Department (HOSPITAL_COMMUNITY): Payer: Medicare Other

## 2015-03-10 ENCOUNTER — Encounter (HOSPITAL_COMMUNITY): Payer: Self-pay | Admitting: Emergency Medicine

## 2015-03-10 ENCOUNTER — Observation Stay (HOSPITAL_COMMUNITY)
Admission: EM | Admit: 2015-03-10 | Discharge: 2015-03-14 | Disposition: A | Discharge status: Home / Self Care | Payer: Medicare Other | Attending: Internal Medicine | Admitting: Internal Medicine

## 2015-03-10 DIAGNOSIS — R740 Nonspecific elevation of levels of transaminase and lactic acid dehydrogenase [LDH]: Principal | ICD-10-CM | POA: Insufficient documentation

## 2015-03-10 DIAGNOSIS — Z955 Presence of coronary angioplasty implant and graft: Secondary | ICD-10-CM | POA: Insufficient documentation

## 2015-03-10 DIAGNOSIS — N400 Enlarged prostate without lower urinary tract symptoms: Secondary | ICD-10-CM | POA: Insufficient documentation

## 2015-03-10 DIAGNOSIS — M479 Spondylosis, unspecified: Secondary | ICD-10-CM | POA: Insufficient documentation

## 2015-03-10 DIAGNOSIS — M1990 Unspecified osteoarthritis, unspecified site: Secondary | ICD-10-CM | POA: Diagnosis not present

## 2015-03-10 DIAGNOSIS — R079 Chest pain, unspecified: Secondary | ICD-10-CM | POA: Diagnosis not present

## 2015-03-10 DIAGNOSIS — Z79899 Other long term (current) drug therapy: Secondary | ICD-10-CM | POA: Insufficient documentation

## 2015-03-10 DIAGNOSIS — E785 Hyperlipidemia, unspecified: Secondary | ICD-10-CM | POA: Diagnosis not present

## 2015-03-10 DIAGNOSIS — I2510 Atherosclerotic heart disease of native coronary artery without angina pectoris: Secondary | ICD-10-CM | POA: Insufficient documentation

## 2015-03-10 DIAGNOSIS — G8929 Other chronic pain: Secondary | ICD-10-CM | POA: Insufficient documentation

## 2015-03-10 DIAGNOSIS — I252 Old myocardial infarction: Secondary | ICD-10-CM | POA: Diagnosis not present

## 2015-03-10 DIAGNOSIS — I10 Essential (primary) hypertension: Secondary | ICD-10-CM | POA: Diagnosis not present

## 2015-03-10 DIAGNOSIS — R7989 Other specified abnormal findings of blood chemistry: Secondary | ICD-10-CM | POA: Diagnosis present

## 2015-03-10 DIAGNOSIS — Z8546 Personal history of malignant neoplasm of prostate: Secondary | ICD-10-CM | POA: Diagnosis not present

## 2015-03-10 DIAGNOSIS — F0390 Unspecified dementia without behavioral disturbance: Secondary | ICD-10-CM | POA: Diagnosis not present

## 2015-03-10 DIAGNOSIS — K219 Gastro-esophageal reflux disease without esophagitis: Secondary | ICD-10-CM | POA: Insufficient documentation

## 2015-03-10 DIAGNOSIS — Z87891 Personal history of nicotine dependence: Secondary | ICD-10-CM | POA: Insufficient documentation

## 2015-03-10 DIAGNOSIS — F03A0 Unspecified dementia, mild, without behavioral disturbance, psychotic disturbance, mood disturbance, and anxiety: Secondary | ICD-10-CM | POA: Diagnosis present

## 2015-03-10 DIAGNOSIS — R748 Abnormal levels of other serum enzymes: Secondary | ICD-10-CM

## 2015-03-10 DIAGNOSIS — R945 Abnormal results of liver function studies: Secondary | ICD-10-CM

## 2015-03-10 DIAGNOSIS — Z7982 Long term (current) use of aspirin: Secondary | ICD-10-CM | POA: Insufficient documentation

## 2015-03-10 DIAGNOSIS — R109 Unspecified abdominal pain: Secondary | ICD-10-CM

## 2015-03-10 HISTORY — DX: Polyp of colon: K63.5

## 2015-03-10 HISTORY — DX: Gastrointestinal stromal tumor of stomach: C49.A2

## 2015-03-10 LAB — I-STAT CHEM 8, ED
BUN: 32 mg/dL — ABNORMAL HIGH (ref 6–20)
CALCIUM ION: 1.14 mmol/L (ref 1.13–1.30)
CHLORIDE: 104 mmol/L (ref 101–111)
Creatinine, Ser: 1 mg/dL (ref 0.61–1.24)
Glucose, Bld: 135 mg/dL — ABNORMAL HIGH (ref 65–99)
HEMATOCRIT: 43 % (ref 39.0–52.0)
HEMOGLOBIN: 14.6 g/dL (ref 13.0–17.0)
POTASSIUM: 4.3 mmol/L (ref 3.5–5.1)
SODIUM: 138 mmol/L (ref 135–145)
TCO2: 22 mmol/L (ref 0–100)

## 2015-03-10 LAB — URINALYSIS, ROUTINE W REFLEX MICROSCOPIC
Bilirubin Urine: NEGATIVE
Glucose, UA: NEGATIVE mg/dL
Ketones, ur: NEGATIVE mg/dL
Leukocytes, UA: NEGATIVE
Nitrite: NEGATIVE
PH: 6.5 (ref 5.0–8.0)
Protein, ur: NEGATIVE mg/dL
SPECIFIC GRAVITY, URINE: 1.012 (ref 1.005–1.030)
Urobilinogen, UA: 1 mg/dL (ref 0.0–1.0)

## 2015-03-10 LAB — COMPREHENSIVE METABOLIC PANEL
ALBUMIN: 3.8 g/dL (ref 3.5–5.0)
ALK PHOS: 136 U/L — AB (ref 38–126)
ALT: 291 U/L — AB (ref 17–63)
AST: 435 U/L — AB (ref 15–41)
Anion gap: 10 (ref 5–15)
BUN: 30 mg/dL — ABNORMAL HIGH (ref 6–20)
CHLORIDE: 103 mmol/L (ref 101–111)
CO2: 25 mmol/L (ref 22–32)
Calcium: 9.1 mg/dL (ref 8.9–10.3)
Creatinine, Ser: 1.05 mg/dL (ref 0.61–1.24)
GFR calc Af Amer: >60 mL/min (ref >60)
GFR calc non Af Amer: 59 mL/min — ABNORMAL LOW (ref >60)
GLUCOSE: 137 mg/dL — AB (ref 65–99)
POTASSIUM: 4.3 mmol/L (ref 3.5–5.1)
SODIUM: 138 mmol/L (ref 135–145)
TOTAL PROTEIN: 7 g/dL (ref 6.5–8.1)
Total Bilirubin: 0.8 mg/dL (ref 0.3–1.2)

## 2015-03-10 LAB — CBG MONITORING, ED: Glucose-Capillary: 137 mg/dL — ABNORMAL HIGH (ref 65–99)

## 2015-03-10 LAB — CBC
HCT: 40.4 % (ref 39.0–52.0)
Hemoglobin: 13 g/dL (ref 13.0–17.0)
MCH: 32.9 pg (ref 26.0–34.0)
MCHC: 32.2 g/dL (ref 30.0–36.0)
MCV: 102.3 fL — ABNORMAL HIGH (ref 78.0–100.0)
PLATELETS: 180 10*3/uL (ref 150–400)
RBC: 3.95 MIL/uL — ABNORMAL LOW (ref 4.22–5.81)
RDW: 14.2 % (ref 11.5–15.5)
WBC: 6 10*3/uL (ref 4.0–10.5)

## 2015-03-10 LAB — LIPASE, BLOOD: Lipase: 16 U/L — ABNORMAL LOW (ref 22–51)

## 2015-03-10 LAB — I-STAT TROPONIN, ED: Troponin i, poc: 0 ng/mL (ref 0.00–0.08)

## 2015-03-10 LAB — URINE MICROSCOPIC-ADD ON

## 2015-03-10 MED ORDER — ASPIRIN 325 MG PO TABS: 325.0000 mg | ORAL_TABLET | ORAL | Status: DC

## 2015-03-10 MED ORDER — ASPIRIN 81 MG PO CHEW: 324.0000 mg | CHEWABLE_TABLET | Freq: Once | ORAL | Status: DC

## 2015-03-10 NOTE — H&P (Signed)
PCP:  Oliver Barre, MD  Cardiology [facility]  Referring provider [facility]   Chief Complaint:  Chest pain   HPI: Eric Cooley is a 81 y.o. 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   Presented with  At 4 pm he developed some nausea right upper quadrant abdominal pain that resolved but 10 minutes later was associated with feeling of heat. He was moving some boxes when started to have significant substernal chest pain described more as an discomfort. She denied severe chest pain but  patient was diaphoretic when presented to emergency department.  Pain shortly thereafter resolved spontaneusly. Patient states that today he noted that he was very gassy and nauseated. Patient has known history of coronary artery disease status post PCI in 2010. Echogram in 2011 has preserved EF although some diastolic dysfunction. Patient presented in all 2014 with similar chest pain. He was supposed to undergo nuclear medicine stress test but that did not happen.  In emergency department patient was noted to have unremarkable EKG normal troponin. His LFTs were noted to be remarkably elevated with a steer 435 and ALT of 291 and alk phosphatase of 136 with normal bilirubin. Ultrasound of the liver  was obtained showing patient being status post cholecystectomy normal common bile duct normal parenchymal echogenicity of the liver.  The bases also noted to be within normal limits.  Patient states that the past 3 months he stopped taking all his medications as he felt that he was taking too much. He's been taking daily doses of Tylenol for chronic back pain reports taking between 4-6 pills a day 500 mg each. He reports being careful not to exceed maximum dosage per day Hospitalist was called for admission for chest pain  Review of Systems:    Pertinent positives include:   abdominal pain, nausea,  chest pain,   Constitutional:  No weight loss, night sweats, Fevers, chills, fatigue, weight loss  HEENT:  No headaches, Difficulty swallowing,Tooth/dental problems,Sore throat,  No sneezing, itching, ear ache, nasal congestion, post nasal drip,  Cardio-vascular:  NoOrthopnea, PND, anasarca, dizziness, palpitations.no Bilateral lower extremity swelling  GI:  No heartburn, indigestion,vomiting, diarrhea, change in bowel habits, loss of appetite, melena, blood in stool, hematemesis Resp:  no shortness of breath at rest. No dyspnea on exertion, No excess mucus, no productive cough, No non-productive cough, No coughing up of blood.No change in color of mucus.No wheezing. Skin:  no rash or lesions. No jaundice GU:  no dysuria, change in color of urine, no urgency or frequency. No straining to urinate.  No flank pain.  Musculoskeletal:  No joint pain or no joint swelling. No decreased range of motion. No back pain.  Psych:  No change in mood or affect. No depression or anxiety. No memory loss.  Neuro: no localizing neurological complaints, no tingling, no weakness, no double vision, no gait abnormality, no slurred speech, no confusion  Otherwise ROS are negative except for above, 10 systems were reviewed  Past Medical History: Past Medical History  Diagnosis  Date  . Hypertension   . MI  (myocardial infarction)   . 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 CAD (coronary artery disease) 03/29/2011  . HTN (hypertension) 03/29/2011  . Hyperlipidemia 03/29/2011  . BPH (benign prostatic hypertrophy) 03/29/2011  . GERD (gastroesophageal reflux disease) 03/29/2011  . S/P cholecystectomy 03/29/2011  . 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 Thrombocytopenia 03/29/2011  . Dizziness - giddy 03/29/2011  . Allergic rhinitis, cause unspecified 03/29/2011  . S/P partial gastrectomy 03/29/2011  . Anemia 03/29/2011  . Arthritis   . Ulcer   . Urine incontinence   . Low back pain   . DJD (degenerative joint disease), lumbar 04/01/2011  . Prostate cancer 03/29/2011  . History of nuclear stress test 03/27/2010    exercise; no ischemic changes, normal study    Past Surgical History  Procedure Laterality Date  . Coronary angioplasty with stent placement  04/24/2009    2 Xience DES to LAD 3.5x25mm and 3.0x72mm overlapping  . Tonsillectomy  1951  . Cholecystectomy  1986  . 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 Transthoracic echocardiogram  10/09/2009    EF 55-60%; normaml LV systolic function with grade 1 diastolic dysfunction     Medications: Prior to Admission medications   Medication Sig Start Date End Date Taking? Authorizing Provider  acetaminophen (TYLENOL) 500 MG tablet Take 500 mg by mouth every 6 (six) hours as needed for pain.   Yes Historical Provider, MD  aspirin 81 MG EC tablet Take 1 tablet (81 mg total) by mouth daily. Patient not taking: Reported on 03/10/2015 05/04/13   Erby Pian, NP  cetirizine (ZYRTEC) 10 MG tablet Take 1 tablet (10 mg total) by mouth daily. Patient not taking: Reported on 03/10/2015 02/01/15   Biagio Borg, MD  donepezil (ARICEPT) 5 MG tablet Take 1 tablet (5 mg total) by mouth at bedtime. Patient not taking: Reported on 03/10/2015 11/11/14   Biagio Borg, MD  levofloxacin (LEVAQUIN) 250 MG tablet Take 1  tablet (250 mg total) by mouth daily. Patient not taking: Reported on 03/10/2015 12/02/14   Biagio Borg, MD  lisinopril (PRINIVIL,ZESTRIL) 5 MG tablet Take 1 tablet (5 mg total) by mouth daily. Patient not taking: Reported on 03/10/2015 11/11/14   Biagio Borg, MD  pantoprazole (PROTONIX) 20 MG tablet Take 1 tablet (20 mg total) by mouth daily. Patient not taking: Reported on 03/10/2015 11/11/14   Biagio Borg, MD  simvastatin (ZOCOR) 10 MG tablet Take 1 tablet (10 mg total) by mouth daily. Patient not taking: Reported on 03/10/2015 11/11/14   Biagio Borg, MD  traMADol (ULTRAM) 50 MG tablet Take 1 tablet (50 mg total) by mouth every 6 (six) hours as needed. Patient not taking: Reported on 03/10/2015 02/01/15   Biagio Borg, MD  triamcinolone (NASACORT AQ) 55 MCG/ACT AERO nasal inhaler Place 2 sprays into the nose daily. Patient not taking: Reported on 03/10/2015 02/01/15   Biagio Borg, MD    Allergies:  No Known Allergies  Social History:  Ambulatory  independently    Lives at home  With family with son     reports that he quit smoking about 54 years ago. He has never used smokeless tobacco. He reports that he drinks about 0.6 oz of alcohol per week. He reports that he does not use illicit drugs.    Family History: family history includes Arthritis in his father and mother; Heart  failure in his mother; Lung cancer in his father; Ovarian cancer in his mother. There is no history of Colon cancer.    Physical Exam: Patient Vitals for the past 24 hrs:  BP Temp Temp src Pulse Resp SpO2  03/10/15 1900 126/66 mmHg - - 88 (!) 29 95 %  03/10/15 1840 134/60 mmHg - - 71 25 94 %  03/10/15 1820 149/67 mmHg - - 70 17 100 %  03/10/15 1800 135/55 mmHg - - 65 22 97 %  03/10/15 1725 - 97.5 F (36.4 C) Oral - - -  03/10/15 1722 (!) 140/50 mmHg - - (!) 54 22 97 %    1. General:  in No Acute distress 2. Psychological: Alert and  Oriented 3. Head/ENT:    Dry Mucous Membranes                          Head Non  traumatic, neck supple                          Normal   Dentition 4. SKIN:   decreased Skin turgor,  Skin clean Dry and intact no rash 5. Heart: Regular rate and rhythm no Murmur, Rub or gallop 6. Lungs: Clear to auscultation bilaterally, no wheezes or crackles   7. Abdomen: Soft, non-tender, Non distended 8. Lower extremities: no clubbing, cyanosis, or edema 9. Neurologically Grossly intact, moving all 4 extremities equally 10. MSK: Normal range of motion  body mass index is unknown because there is no weight on file.   Labs on Admission:   Results for orders placed or performed during the hospital encounter of 03/10/15 (from the past 24 hour(s))  CBG monitoring, ED     Status: Abnormal   Collection Time: 03/10/15  5:28 PM  Result Value Ref Range   Glucose-Capillary 137 (H) 65 - 99 mg/dL  I-stat troponin, ED  (not at [facility], [facility])     Status: None   Collection Time: 03/10/15  5:40 PM  Result Value Ref Range   Troponin i, poc 0.00 0.00 - 0.08 ng/mL   Comment 3          CBC     Status: Abnormal   Collection Time: 03/10/15  5:41 PM  Result Value Ref Range   WBC 6.0 4.0 - 10.5 K/uL   RBC 3.95 (L) 4.22 - 5.81 MIL/uL   Hemoglobin 13.0 13.0 - 17.0 g/dL   HCT 40.4 39.0 - 52.0 %   MCV 102.3 (H) 78.0 - 100.0 fL   MCH 32.9 26.0 - 34.0 pg   MCHC 32.2 30.0 - 36.0 g/dL   RDW 14.2 11.5 - 15.5 %   Platelets 180 150 - 400 K/uL  Comprehensive metabolic panel     Status: Abnormal   Collection Time: 03/10/15  5:46 PM  Result Value Ref Range   Sodium 138 135 - 145 mmol/L   Potassium 4.3 3.5 - 5.1 mmol/L   Chloride 103 101 - 111 mmol/L   CO2 25 22 - 32 mmol/L   Glucose, Bld 137 (H) 65 - 99 mg/dL   BUN 30 (H) 6 - 20 mg/dL   Creatinine, Ser 1.05 0.61 - 1.24 mg/dL   Calcium 9.1 8.9 - 10.3 mg/dL   Total Protein 7.0 6.5 - 8.1 g/dL   Albumin 3.8 3.5 - 5.0 g/dL   AST 435 (H) 15 - 41 U/L   ALT 291 (H) 17 -  63 U/L   Alkaline Phosphatase 136 (H) 38 - 126 U/L   Total Bilirubin 0.8 0.3 - 1.2 mg/dL     GFR calc non Af Amer 59 (L) >60 mL/min   GFR calc Af Amer >60 >60 mL/min   Anion gap 10 5 - 15  Lipase, blood     Status: Abnormal   Collection Time: 03/10/15  5:46 PM  Result Value Ref Range   Lipase 16 (L) 22 - 51 U/L  I-stat Chem 8, ED     Status: Abnormal   Collection Time: 03/10/15  5:54 PM  Result Value Ref Range   Sodium 138 135 - 145 mmol/L   Potassium 4.3 3.5 - 5.1 mmol/L   Chloride 104 101 - 111 mmol/L   BUN 32 (H) 6 - 20 mg/dL   Creatinine, Ser 1.00 0.61 - 1.24 mg/dL   Glucose, Bld 135 (H) 65 - 99 mg/dL   Calcium, Ion 1.14 1.13 - 1.30 mmol/L   TCO2 22 0 - 100 mmol/L   Hemoglobin 14.6 13.0 - 17.0 g/dL   HCT 43.0 39.0 - 52.0 %  Urinalysis, Routine w reflex microscopic     Status: Abnormal   Collection Time: 03/10/15  8:09 PM  Result Value Ref Range   Color, Urine YELLOW YELLOW   APPearance CLEAR CLEAR   Specific Gravity, Urine 1.012 1.005 - 1.030   pH 6.5 5.0 - 8.0   Glucose, UA NEGATIVE NEGATIVE mg/dL   Hgb urine dipstick SMALL (A) NEGATIVE   Bilirubin Urine NEGATIVE NEGATIVE   Ketones, ur NEGATIVE NEGATIVE mg/dL   Protein, ur NEGATIVE NEGATIVE mg/dL   Urobilinogen, UA 1.0 0.0 - 1.0 mg/dL   Nitrite NEGATIVE NEGATIVE   Leukocytes, UA NEGATIVE NEGATIVE  Urine microscopic-add on     Status: None   Collection Time: 03/10/15  8:09 PM  Result Value Ref Range   RBC / HPF 0-2 <3 RBC/hpf    UA no evidence of UTI  Lab Results  Component Value Date   HGBA1C  04/21/2009    6.0 (NOTE) The ADA recommends the following therapeutic goal for glycemic control related to Hgb A1c measurement: Goal of therapy: <6.5 Hgb A1c  Reference: American Diabetes Association: Clinical Practice Recommendations 2010, Diabetes Care, 2010, 33: (Suppl  1).    CrCl cannot be calculated (Unknown ideal weight.).  BNP (last 3 results) No results for input(s): PROBNP in the last 8760 hours.  Other results:  I have pearsonaly reviewed this: ECG REPORT  Rate:55  Rhythm: Normal sinus  rhythm  ST&T Change: No ischemic changes EDC 422  There were no vitals filed for this visit.   Cultures: No results found for: SDES, SPECREQUEST, CULT, REPTSTATUS   Radiological Exams on Admission: Dg Chest Port 1 View  03/10/2015   CLINICAL DATA:  Chest and epigastric pain beginning in this morning. Diaphoresis. Coronary artery disease and previous myocardial infarct.  EXAM: PORTABLE CHEST - 1 VIEW  COMPARISON:  02/01/2015  FINDINGS: The heart size and mediastinal contours are within normal limits. Both lungs are clear. The visualized skeletal structures are unremarkable.  IMPRESSION: No active disease.   Electronically Signed   By: Earle Gell M.D.   On: 03/10/2015 18:47   US Abdomen Limited Ruq  03/10/2015   CLINICAL DATA:  Elevated liver enzymes.  EXAM: US ABDOMEN LIMITED - RIGHT UPPER QUADRANT  COMPARISON:  CT scan of the abdomen dated 10/10/2009  FINDINGS: Gallbladder:  Removed  Common bile duct:  Diameter: 4.9 mm, normal.  Liver:  No focal lesion identified. Within normal limits in parenchymal echogenicity.  IMPRESSION: Normal exam.  The gallbladder has been removed.   Electronically Signed   By: Lorriane Shire M.D.   On: 03/10/2015 21:49    Chart has been reviewed  Family not  at  Bedside    Assessment/Plan  81 year old gentleman history coronary artery disease and hypertension presents with somewhat atypical chest pain which was associated right upper quadrant pain and LFT abnormalities with normal ultrasound of the liver  Present on Admission:  . Chest pain - atypical but given history of coronary disease will admit and cycle cardiac enzymes and monitor on telemetry. If workup unremarkable Patient should follow up as an outpatient cardiology otherwise cardiology consult should be obtained  . HTN (hypertension) currently blood pressure stable hold off on lisinopril  . CAD (coronary artery disease) restart daily aspirin hold statins given given LFT abnormalities . Elevated LFTs -  etiology unclear denies alcohol abuse. Reports daily use of Tylenol but denies overuse. It's possibly that an elderly even daily use in moderate doses have led to accumulation. We'll obtain an acetaminophen level. Also will obtain hepatitis serologies and CK.    Prophylaxis:   Lovenox   CODE STATUS:  FULL CODE  as per patient    Disposition:  To home once workup is complete and patient is stable  Other plan as per orders.  I have spent a total of 32min on this admission  Cigi Bega 03/10/2015, 11:06 PM  [facility]  Pager [phone]   after 2 AM please page floor coverage PA If 7AM-7PM, please contact the day team taking care of the patient  [email or web address]  Password [email or web address]

## 2015-03-10 NOTE — ED Notes (Signed)
Pt presents to ED c/o chest pain and abdominal pain starting this morning. When pt arrived at registration he was unable to answer questions. Pt appeared to have difficulty focusing and was clutching his chest. Pt very diaphoretic. Pt unable to answer questions for this RN for about 4 minutes. Pain seemed to subside a bit and pt reports intermittent chest pain and abdominal pain starting at 3pm. Pt still diaphoretic and pale. Pt weak when transferring from wheelchair to bed. A&Ox4 at this time.

## 2015-03-10 NOTE — ED Notes (Addendum)
MD at bedside. 

## 2015-03-10 NOTE — ED Provider Notes (Signed)
CSN: [id number]     Arrival date & time 03/10/15  1707 History   First MD Initiated Contact with Patient 03/10/15 1730     Chief Complaint  Patient presents with  . Chest Pain  . Abdominal Pain     (Consider location/radiation/quality/duration/timing/severity/associated sxs/prior Treatment) HPI Comments: Patient presents with chest and abdominal pain. He has a history of coronary artery disease status post stent placement in 2010. He's also had prior cholecystectomy and appendectomy benign gastric tumor removed. He states that shortly before arrival to the ED he had a sudden onset of intense pain to his right lower abdomen. This lasted about 15 minutes and went away. Following that he had an onset of pain to the center of his chest. He states it radiated from his epigastrium all the way to the top of his chest. He states this lasted about 20 minutes or so. It was fairly intense on arrival to the ED and he had associated nausea and diaphoresis. There is no vomiting. Shortly after that the pain subsided. He denies any current pain. He denies any shortness of breath. He states he was doing minor activity when the pain started. He denies any change in bowel habits. He did have one bowel movement with the onset of the right lower quadrant pain. He denies any fevers. He denies any urinary symptoms.  Patient is a 81 y.o. male presenting with chest pain and abdominal pain.  Chest Pain Associated symptoms: abdominal pain, diaphoresis and nausea   Associated symptoms: no back pain, no cough, no dizziness, no fatigue, no fever, no headache, no numbness, no shortness of breath, not vomiting and no weakness   Abdominal Pain Associated symptoms: chest pain and nausea   Associated symptoms: no chills, no cough, no diarrhea, no fatigue, no fever, no hematuria, no shortness of breath and no vomiting     Past Medical History  Diagnosis Date  . Hypertension   . MI (myocardial infarction)   . History of benign  gastric tumor 03/29/2011  . CAD (coronary artery disease) 03/29/2011  . HTN (hypertension) 03/29/2011  . Hyperlipidemia 03/29/2011  . BPH (benign prostatic hypertrophy) 03/29/2011  . GERD (gastroesophageal reflux disease) 03/29/2011  . S/P cholecystectomy 03/29/2011  . 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 Thrombocytopenia 03/29/2011  . Dizziness - giddy 03/29/2011  . Allergic rhinitis, cause unspecified 03/29/2011  . S/P partial gastrectomy 03/29/2011  . Anemia 03/29/2011  . Arthritis   . Ulcer   . Urine incontinence   . Low back pain   . DJD (degenerative joint disease), lumbar 04/01/2011  . Prostate cancer 03/29/2011  . History of nuclear stress test 03/27/2010    exercise; no ischemic changes, normal study    Past Surgical History  Procedure Laterality Date  . Coronary angioplasty with stent placement  04/24/2009    2 Xience DES to LAD 3.5x16mm and 3.0x55mm overlapping  . Tonsillectomy  1951  . Cholecystectomy  1986  . 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 Transthoracic echocardiogram  10/09/2009    EF 55-60%; normaml LV systolic function with grade 1 diastolic dysfunction   Family History  Problem Relation Age of Onset  . Arthritis Mother   . Ovarian cancer Mother   . Heart failure Mother   . Arthritis Father   . Lung cancer Father   . Colon cancer Neg Hx    History  Substance Use Topics  . Smoking status: Former Smoker  Quit date: 10/07/1960  . Smokeless tobacco: Never Used     Comment: Stopped smoking in 1962  . Alcohol Use: 0.6 oz/week    1 Glasses of wine per week     Comment: occasional    Review of Systems  Constitutional: Positive for diaphoresis. Negative for fever, chills and fatigue.  HENT: Negative for congestion, rhinorrhea and sneezing.   Eyes: Negative.   Respiratory: Negative for cough, chest tightness and shortness of breath.   Cardiovascular: Positive for chest pain. Negative for leg swelling.   Gastrointestinal: Positive for nausea and abdominal pain. Negative for vomiting, diarrhea and blood in stool.  Genitourinary: Negative for frequency, hematuria, flank pain and difficulty urinating.  Musculoskeletal: Negative for back pain and arthralgias.  Skin: Negative for rash.  Neurological: Negative for dizziness, speech difficulty, weakness, numbness and headaches.      Allergies  Review of patient's allergies indicates no known allergies.  Home Medications   Prior to Admission medications   Medication Sig Start Date End Date Taking? Authorizing Provider  acetaminophen (TYLENOL) 500 MG tablet Take 500 mg by mouth every 6 (six) hours as needed for pain.   Yes Historical Provider, MD  aspirin 81 MG EC tablet Take 1 tablet (81 mg total) by mouth daily. Patient not taking: Reported on 03/10/2015 05/04/13   Erby Pian, NP  cetirizine (ZYRTEC) 10 MG tablet Take 1 tablet (10 mg total) by mouth daily. Patient not taking: Reported on 03/10/2015 02/01/15   Biagio Borg, MD  donepezil (ARICEPT) 5 MG tablet Take 1 tablet (5 mg total) by mouth at bedtime. Patient not taking: Reported on 03/10/2015 11/11/14   Biagio Borg, MD  levofloxacin (LEVAQUIN) 250 MG tablet Take 1 tablet (250 mg total) by mouth daily. Patient not taking: Reported on 03/10/2015 12/02/14   Biagio Borg, MD  lisinopril (PRINIVIL,ZESTRIL) 5 MG tablet Take 1 tablet (5 mg total) by mouth daily. Patient not taking: Reported on 03/10/2015 11/11/14   Biagio Borg, MD  pantoprazole (PROTONIX) 20 MG tablet Take 1 tablet (20 mg total) by mouth daily. Patient not taking: Reported on 03/10/2015 11/11/14   Biagio Borg, MD  simvastatin (ZOCOR) 10 MG tablet Take 1 tablet (10 mg total) by mouth daily. Patient not taking: Reported on 03/10/2015 11/11/14   Biagio Borg, MD  traMADol (ULTRAM) 50 MG tablet Take 1 tablet (50 mg total) by mouth every 6 (six) hours as needed. Patient not taking: Reported on 03/10/2015 02/01/15   Biagio Borg, MD  triamcinolone  (NASACORT AQ) 55 MCG/ACT AERO nasal inhaler Place 2 sprays into the nose daily. Patient not taking: Reported on 03/10/2015 02/01/15   Biagio Borg, MD   BP 126/66 mmHg  Pulse 88  Temp(Src) 97.5 F (36.4 C) (Oral)  Resp 29  SpO2 95% Physical Exam  Constitutional: He is oriented to person, place, and time. He appears well-developed and well-nourished.  HENT:  Head: Normocephalic and atraumatic.  Eyes: Pupils are equal, round, and reactive to light.  Neck: Normal range of motion. Neck supple.  Cardiovascular: Normal rate, regular rhythm and normal heart sounds.   Pulmonary/Chest: Effort normal and breath sounds normal. No respiratory distress. He has no wheezes. He has no rales. He exhibits no tenderness.  Abdominal: Soft. Bowel sounds are normal. There is no tenderness. There is no rebound and no guarding.  Musculoskeletal: Normal range of motion. He exhibits no edema.  No swelling or calf tenderness  Lymphadenopathy:    He has  no cervical adenopathy.  Neurological: He is alert and oriented to person, place, and time.  Skin: Skin is warm and dry. No rash noted.  Psychiatric: He has a normal mood and affect.    ED Course  Procedures (including critical care time) Labs Review Labs Reviewed  CBC - Abnormal; Notable for the following:    RBC 3.95 (*)    MCV 102.3 (*)    All other components within normal limits  COMPREHENSIVE METABOLIC PANEL - Abnormal; Notable for the following:    Glucose, Bld 137 (*)    BUN 30 (*)    AST 435 (*)    ALT 291 (*)    Alkaline Phosphatase 136 (*)    GFR calc non Af Amer 59 (*)    All other components within normal limits  LIPASE, BLOOD - Abnormal; Notable for the following:    Lipase 16 (*)    All other components within normal limits  URINALYSIS, ROUTINE W REFLEX MICROSCOPIC (NOT AT [facility]) - Abnormal; Notable for the following:    Hgb urine dipstick SMALL (*)    All other components within normal limits  CBG MONITORING, ED - Abnormal; Notable  for the following:    Glucose-Capillary 137 (*)    All other components within normal limits  I-STAT CHEM 8, ED - Abnormal; Notable for the following:    BUN 32 (*)    Glucose, Bld 135 (*)    All other components within normal limits  URINE MICROSCOPIC-ADD ON  ACETAMINOPHEN LEVEL  Randolm Idol, ED    Imaging Review Dg Chest Port 1 View  03/10/2015   CLINICAL DATA:  Chest and epigastric pain beginning in this morning. Diaphoresis. Coronary artery disease and previous myocardial infarct.  EXAM: PORTABLE CHEST - 1 VIEW  COMPARISON:  02/01/2015  FINDINGS: The heart size and mediastinal contours are within normal limits. Both lungs are clear. The visualized skeletal structures are unremarkable.  IMPRESSION: No active disease.   Electronically Signed   By: Earle Gell M.D.   On: 03/10/2015 18:47   US Abdomen Limited Ruq  03/10/2015   CLINICAL DATA:  Elevated liver enzymes.  EXAM: US ABDOMEN LIMITED - RIGHT UPPER QUADRANT  COMPARISON:  CT scan of the abdomen dated 10/10/2009  FINDINGS: Gallbladder:  Removed  Common bile duct:  Diameter: 4.9 mm, normal.  Liver:  No focal lesion identified. Within normal limits in parenchymal echogenicity.  IMPRESSION: Normal exam.  The gallbladder has been removed.   Electronically Signed   By: Lorriane Shire M.D.   On: 03/10/2015 21:49     EKG Interpretation   Date/Time:  Friday March 10 2015 17:13:48 EDT Ventricular Rate:  55 PR Interval:  170 QRS Duration: 87 QT Interval:  441 QTC Calculation: 422 R Axis:   52 Text Interpretation:  Sinus rhythm Baseline wander in lead(s) V6 Confirmed  by Jeriah Corkum  MD, Viviana Trimble ([id number]) on 03/10/2015 5:32:34 PM      MDM   Final diagnoses:  Elevated liver enzymes  Chest pain, unspecified chest pain type    Patient presents with atypical chest pain. He initially had an episode of pain in his right lower abdomen. This went away and he developed pain in his chest which lasted about 20 minutes. He was very diaphoretic on  arrival.his EKG does not show ischemic changes. His troponin is negative. His LFTs are elevated but he is status post cholecystectomy.right upper quadrant ultrasound does not indicate any abnormalities. Tylenol level is pending. His abdomen is  nontender on exam. I consulted with Dr. Roel Cluck who will admit the patient for further evaluation.    Malvin Johns, MD 03/10/15 [phone]

## 2015-03-10 NOTE — Telephone Encounter (Signed)
[facility] [facility] - Client [facility]  Patient Name: Eric Cooley  DOB: Nov 21, 1919    Initial Comment Back pain, nausea, sweating   Nurse Assessment  Nurse: Mechele Dawley, RN, Amy Date/Time Eilene Ghazi Time): 03/10/2015 3:48:56 PM  Confirm and document reason for call. If symptomatic, describe symptoms. ---caller states that he had some back pain, sweating and nausea. not sick recently. he states his pain was localized below the ribcage due to the pain. he feels like he was back to normal.  Has the patient traveled out of the country within the last 30 days? ---Not Applicable  Does the patient require triage? ---Yes  Related visit to physician within the last 2 weeks? ---No  Does the PT have any chronic conditions? (i.e. diabetes, asthma, etc.) ---Yes  List chronic conditions. ---stents,     Guidelines    Guideline Title Affirmed Question Affirmed Notes  Back Pain [1] SEVERE back pain (e.g., excruciating, unable to do any normal activities) AND [2] not improved 2 hours after pain medicine    Final Disposition User   See Physician within 4 Hours (or PCP triage) [name], RN, Amy    Comments  nurse did an over-ride for him to be seen today at the ed. he went on to tell the nurse that he had a lot of gas today as well as he thought he was going to vomit, but had a bm instead. he has not been sick in order to have that episode. he was not feeling well for about 45 minutes and it seemed to pass. no md's available for him to be seen in the office this afternoon. he agreed with the outcome.

## 2015-03-11 ENCOUNTER — Inpatient Hospital Stay (HOSPITAL_COMMUNITY): Payer: Medicare Other

## 2015-03-11 DIAGNOSIS — R7989 Other specified abnormal findings of blood chemistry: Secondary | ICD-10-CM

## 2015-03-11 DIAGNOSIS — I10 Essential (primary) hypertension: Secondary | ICD-10-CM | POA: Diagnosis not present

## 2015-03-11 DIAGNOSIS — R079 Chest pain, unspecified: Secondary | ICD-10-CM | POA: Diagnosis not present

## 2015-03-11 DIAGNOSIS — R772 Abnormality of alphafetoprotein: Secondary | ICD-10-CM | POA: Diagnosis not present

## 2015-03-11 DIAGNOSIS — R0789 Other chest pain: Secondary | ICD-10-CM | POA: Diagnosis not present

## 2015-03-11 DIAGNOSIS — F0390 Unspecified dementia without behavioral disturbance: Secondary | ICD-10-CM | POA: Diagnosis not present

## 2015-03-11 DIAGNOSIS — I2510 Atherosclerotic heart disease of native coronary artery without angina pectoris: Secondary | ICD-10-CM | POA: Diagnosis not present

## 2015-03-11 DIAGNOSIS — R740 Nonspecific elevation of levels of transaminase and lactic acid dehydrogenase [LDH]: Secondary | ICD-10-CM | POA: Diagnosis not present

## 2015-03-11 LAB — PROTIME-INR
INR: 1.03 (ref 0.00–1.49)
Prothrombin Time: 13.7 seconds (ref 11.6–15.2)

## 2015-03-11 LAB — COMPREHENSIVE METABOLIC PANEL
ALT: 1024 U/L — ABNORMAL HIGH (ref 17–63)
ANION GAP: 9 (ref 5–15)
AST: 1433 U/L — AB (ref 15–41)
Albumin: 3.8 g/dL (ref 3.5–5.0)
Alkaline Phosphatase: 283 U/L — ABNORMAL HIGH (ref 38–126)
BUN: 19 mg/dL (ref 6–20)
CO2: 26 mmol/L (ref 22–32)
Calcium: 9.3 mg/dL (ref 8.9–10.3)
Chloride: 105 mmol/L (ref 101–111)
Creatinine, Ser: 0.97 mg/dL (ref 0.61–1.24)
GFR calc Af Amer: >60 mL/min (ref >60)
GFR calc non Af Amer: >60 mL/min (ref >60)
Glucose, Bld: 97 mg/dL (ref 65–99)
POTASSIUM: 3.8 mmol/L (ref 3.5–5.1)
Sodium: 140 mmol/L (ref 135–145)
TOTAL PROTEIN: 7 g/dL (ref 6.5–8.1)
Total Bilirubin: 1 mg/dL (ref 0.3–1.2)

## 2015-03-11 LAB — TROPONIN I: Troponin I: <0.03 ng/mL (ref <0.031)

## 2015-03-11 LAB — AMMONIA: Ammonia: 37 umol/L — ABNORMAL HIGH (ref 9–35)

## 2015-03-11 LAB — ACETAMINOPHEN LEVEL

## 2015-03-11 LAB — CK: Total CK: 164 U/L (ref 49–397)

## 2015-03-11 MED ORDER — ASPIRIN 81 MG PO CHEW
81.0000 mg | CHEWABLE_TABLET | Freq: Every day | ORAL | Status: DC
  Administered 2015-03-11 – 2015-03-12 (×2): 81 mg via ORAL
  Filled 2015-03-11 (×2): qty 1

## 2015-03-11 MED ORDER — ONDANSETRON HCL 4 MG/2ML IJ SOLN: 4.0000 mg | Freq: Four times a day (QID) | INTRAMUSCULAR | Status: DC | PRN

## 2015-03-11 MED ORDER — DONEPEZIL HCL 5 MG PO TABS
5.0000 mg | ORAL_TABLET | Freq: Every day | ORAL | Status: DC
  Administered 2015-03-11 (×2): 5 mg via ORAL
  Filled 2015-03-11 (×2): qty 1

## 2015-03-11 MED ORDER — PANTOPRAZOLE SODIUM 20 MG PO TBEC
20.0000 mg | DELAYED_RELEASE_TABLET | Freq: Every day | ORAL | Status: DC
  Administered 2015-03-11 – 2015-03-12 (×2): 20 mg via ORAL
  Filled 2015-03-11 (×2): qty 1

## 2015-03-11 MED ORDER — ENOXAPARIN SODIUM 40 MG/0.4ML ~~LOC~~ SOLN
40.0000 mg | Freq: Every day | SUBCUTANEOUS | Status: DC
  Administered 2015-03-11 – 2015-03-13 (×4): 40 mg via SUBCUTANEOUS
  Filled 2015-03-11 (×4): qty 0.4

## 2015-03-11 MED ORDER — TRAMADOL HCL 50 MG PO TABS: 50.0000 mg | ORAL_TABLET | Freq: Four times a day (QID) | ORAL | Status: DC | PRN

## 2015-03-11 MED ORDER — MORPHINE SULFATE 2 MG/ML IJ SOLN: 2.0000 mg | INTRAMUSCULAR | Status: DC | PRN

## 2015-03-11 NOTE — Consult Note (Signed)
Consultation   History of Present Illness:  This is a 81 yo WM admitted with 2 episodes of RUQ abd. pain several hours apart, associated with nausea. Denies jaundice, dark urine. Pt had a chill, his pain was similar to gall bladder pain  Prior to cholecystectomy in 1986. He takes Tylenol 500mg , 2 tabs up to 3x/day. CT scan of the abdomen negative for ongoing process. Normal sizepleen- no evidence of chronic liver disease, but AST and ALT markedly elevated at 435/291 and 1433/1024 this am. Normal bili, ammonia 37. Ptime is pending. Partial gastric resection of a benign tumor 2011  Past Medical History  Diagnosis Date  . Hypertension   . MI (myocardial infarction)   . 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 CAD (coronary artery disease) 03/29/2011  . HTN (hypertension) 03/29/2011  . Hyperlipidemia 03/29/2011  . BPH (benign prostatic hypertrophy) 03/29/2011  . GERD (gastroesophageal reflux disease) 03/29/2011  . S/P cholecystectomy 03/29/2011  . 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 Thrombocytopenia 03/29/2011  . Dizziness - giddy 03/29/2011  . Allergic rhinitis, cause unspecified 03/29/2011  . S/P partial gastrectomy 03/29/2011  . Anemia 03/29/2011  . Arthritis   . Ulcer   . Urine incontinence   . Low back pain   . DJD (degenerative joint disease), lumbar 04/01/2011  . Prostate cancer 03/29/2011  . History of nuclear stress test 03/27/2010    exercise; no ischemic changes, normal study    Past Surgical History  Procedure Laterality Date  . Coronary angioplasty with stent placement  04/24/2009    2 Xience DES to LAD 3.5x19mm and 3.0x80mm overlapping  . Tonsillectomy  1951  . Cholecystectomy  1986  . 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 Transthoracic echocardiogram  10/09/2009    EF 55-60%; normaml LV systolic function with grade 1 diastolic dysfunction    reports that he quit smoking about 54 years ago. He has never used  smokeless tobacco. He reports that he drinks about 0.6 oz of alcohol per week. He reports that he does not use illicit drugs. family history includes Arthritis in his father and mother; Heart failure in his mother; Lung cancer in his father; Ovarian cancer in his mother. There is no history of Colon cancer. No Known Allergies      Review of Systems:multiple joint pains,   The remainder of the 10 point ROS is negative except as outlined in H&P   Physical Exam: General appearance  elderly, in no distress., very pleasant and informed, eating his lunch Eyes- non icteric. HEENT nontraumatic, normocephalic. Mouth no lesions, tongue papillated, no cheilosis. Neck supple without adenopathy, thyroid not enlarged, no carotid bruits, no JVD. Lungs Clear to auscultation bilaterally. Cor normal S1, normal S2, regular rhythm, no murmur,  quiet precordium. Abdomen: soft, non tender, post open cholecystectomy scar, post gastrectomy scar, Normal bowl sounds Rectal:not done Extremities no pedal edema. Skin no lesions. Neurological alert and oriented x 3. Psychological normal mood and affect.  Assessment and Plan:  81 yo WM admitted with  2 episodes of abdominal pain which resemble biliary colic, but his abdominal exam is normal and his imaging studies show no evidence of biliary obstruction. Consider microlithiasis or   SOOddi dysfunction. To explain the pain which has now subsided. Check amylase, lipase in case he passed a stone Abnormal elevation of LFT's, specifically transaminases is more suggestive of an acute hepatocellular injury than a biliary colic. Tylenol toxicity is a  possibility although Tylenol level on 03/10/2015 was normal. He has been taking up to 3gm/day.Suggest to follow LFT's daily, Ammonia, Protime,   03/11/2015 Delfin Edis

## 2015-03-11 NOTE — Care Management Note (Signed)
Case Management Note  Patient Details  Name: Eric Cooley MRN: [id number] Date of Birth: 1919-12-24  Subjective/Objective:  81 y/o m admitted w/ruq pain.Hx:lap chole 2012.From home.                  Action/Plan:anticipate d/c plan home.   Expected Discharge Date:                  Expected Discharge Plan:  Home/Self Care  In-House Referral:     Discharge planning Services  CM Consult  Post Acute Care Choice:    Choice offered to:     DME Arranged:    DME Agency:     HH Arranged:    HH Agency:     Status of Service:  In process, will continue to follow  Medicare Important Message Given:    Date Medicare IM Given:    Medicare IM give by:    Date Additional Medicare IM Given:    Additional Medicare Important Message give by:     If discussed at [place] of Stay Meetings, dates discussed:    Additional Comments:  Dessa Phi, RN 03/11/2015, 3:12 PM

## 2015-03-11 NOTE — Progress Notes (Signed)
  Echocardiogram 2D Echocardiogram has been performed.  Lysle Rubens 03/11/2015, 2:34 PM

## 2015-03-11 NOTE — Progress Notes (Signed)
[facility] PROGRESS NOTE  BRENTLY VOORHIS SWF:[id number] DOB: 04-11-1920 DOA: 03/10/2015 PCP: Cathlean Cower, MD   Brief narrative:  81 year old male with history of hypertension, CAD with history of MI, benign gastric tumor in 2012 status post partial  gastrectomy, BPH, GERD, history of cholecystectomy,, cytopenia, chronic back pain with DJD on tylenol who presented with acute onset of right upper quadrant pain associated with nausea and gaseous distention. This was followed by substernal chest pain with diaphoresis. In the ED patient had an unchanged EKG and normal troponin. LFTs were noted to be markedly elevated with AST of 435 and AST of 291, alkaline phosphatase of 136 with normal bilirubin. Ultrasound of the liver was unremarkable ( s/p cholecystectomy with normal CBD) . Patient admitted to hospitalist service for further management.    Assessment/Plan:  Acute transaminitis Worsened LFTs this am with ALT of 1400s, AST of 100 and Alk of 283. Normal total bilirubin. Hepatitis panel sent. INR normal. Ammonia level mildly elevated. Tylenol level <10. Possibly  acute ischemic/ viral hepatitis.  check hepatitis panel.  monitor LFTs closely. Avoid hepatotoxic agents including tylenol and tramadol. Discontinue statin. Consulted [facility]. Dr Olevia Perches to see.  Chest pain Appears atypical and currently resolved. Stable on telemetry and serial cardiac enzymes negative. Check 2-D echo.  DJD with chronic low back pain Has been following with PCP for chronic pain. Avoid Tylenol. Will place on when necessary morphine for now. Discontinue tramadol.  Coronary artery disease Continue aspirin. Discontinue statin.  Essential hypertension Stable. Continue lisinopril.   Dementia Mild. Continue Aricept.    Code Status: full code Family Communication: spoke with daughter Sonia Baller on the phone Disposition Plan: continue tele monitoring   Consultants:  [facility]  Procedures:  [place]  abd  Antibiotics:  None  HPI/Subjective: Patient seen and examined. Denies any abdominal or chest pain at this time.  Objective: Filed Vitals:   03/11/15 0454  BP: 134/69  Pulse: 77  Temp: 98.5 F (36.9 C)  Resp: 18    Intake/Output Summary (Last 24 hours) at 03/11/15 1155 Last data filed at 03/11/15 0746  Gross per 24 hour  Intake    240 ml  Output   1151 ml  Net   -911 ml   Filed Weights   03/11/15 0055  Weight: 59.1 kg (130 lb 4.7 oz)    Exam:   General:  Elderly male in no acute distress  HEENT: No pallor, no icterus, moist oral mucosa, supple  Chest: Clear to auscultation bilaterally, no added sounds  CVS: Normal S1 and S2, no murmurs rub or gallop  GI: Soft, nondistended, nontender, bowel sounds present  Musculoskeletal: Warm, no edema  CNS: Alert and oriented  Data Reviewed: Basic Metabolic Panel:  Recent Labs Lab 03/10/15 1746 03/10/15 1754 03/11/15 0504  NA 138 138 140  K 4.3 4.3 3.8  CL 103 104 105  CO2 25  --  26  GLUCOSE 137* 135* 97  BUN 30* 32* 19  CREATININE 1.05 1.00 0.97  CALCIUM 9.1  --  9.3   Liver Function Tests:  Recent Labs Lab 03/10/15 1746 03/11/15 0504  AST 435* 1433*  ALT 291* 1024*  ALKPHOS 136* 283*  BILITOT 0.8 1.0  PROT 7.0 7.0  ALBUMIN 3.8 3.8    Recent Labs Lab 03/10/15 1746  LIPASE 16*    Recent Labs Lab 03/11/15 0814  AMMONIA 37*   CBC:  Recent Labs Lab 03/10/15 1741 03/10/15 1754  WBC 6.0  --   HGB  13.0 14.6  HCT 40.4 43.0  MCV 102.3*  --   PLT 180  --    Cardiac Enzymes:  Recent Labs Lab 03/11/15 0018 03/11/15 0242 03/11/15 0504 03/11/15 0811  CKTOTAL 164  --   --   --   TROPONINI  --  <0.03 <0.03 <0.03   BNP (last 3 results) No results for input(s): BNP in the last 8760 hours.  ProBNP (last 3 results) No results for input(s): PROBNP in the last 8760 hours.  CBG:  Recent Labs Lab 03/10/15 1728  GLUCAP 137*    No results found for this or any previous visit  (from the past 240 hour(s)).   Studies: Dg Chest Port 1 View  03/10/2015   CLINICAL DATA:  Chest and epigastric pain beginning in this morning. Diaphoresis. Coronary artery disease and previous myocardial infarct.  EXAM: PORTABLE CHEST - 1 VIEW  COMPARISON:  02/01/2015  FINDINGS: The heart size and mediastinal contours are within normal limits. Both lungs are clear. The visualized skeletal structures are unremarkable.  IMPRESSION: No active disease.   Electronically Signed   By: John  Stahl M.D.   On: 03/10/2015 18:47   Us Abdomen Limited Ruq  03/10/2015   CLINICAL DATA:  Elevated liver enzymes.  EXAM: US ABDOMEN LIMITED - RIGHT UPPER QUADRANT  COMPARISON:  CT scan of the abdomen dated 10/10/2009  FINDINGS: Gallbladder:  Removed  Common bile duct:  Diameter: 4.9 mm, normal.  Liver:  No focal lesion identified. Within normal limits in parenchymal echogenicity.  IMPRESSION: Normal exam.  The gallbladder has been removed.   Electronically Signed   By: James  Maxwell M.D.   On: 03/10/2015 21:49    Scheduled Meds: . aspirin  324 mg Oral Once  . aspirin  81 mg Oral Daily  . donepezil  5 mg Oral QHS  . enoxaparin (LOVENOX) injection  40 mg Subcutaneous QHS  . pantoprazole  20 mg Oral Daily   Continuous Infusions:     Time spent: 25 minutes    ,   [facility] Pager [phone] If 7PM-7AM, please contact night-coverage at [email or web address], password [email or web address] 03/11/2015, 11:55 AM  LOS: 1 day             

## 2015-03-12 DIAGNOSIS — I10 Essential (primary) hypertension: Secondary | ICD-10-CM | POA: Diagnosis not present

## 2015-03-12 DIAGNOSIS — I2510 Atherosclerotic heart disease of native coronary artery without angina pectoris: Secondary | ICD-10-CM | POA: Diagnosis not present

## 2015-03-12 DIAGNOSIS — R740 Nonspecific elevation of levels of transaminase and lactic acid dehydrogenase [LDH]: Secondary | ICD-10-CM | POA: Diagnosis not present

## 2015-03-12 DIAGNOSIS — F0390 Unspecified dementia without behavioral disturbance: Secondary | ICD-10-CM | POA: Diagnosis not present

## 2015-03-12 DIAGNOSIS — R079 Chest pain, unspecified: Secondary | ICD-10-CM | POA: Diagnosis not present

## 2015-03-12 DIAGNOSIS — R7989 Other specified abnormal findings of blood chemistry: Secondary | ICD-10-CM | POA: Diagnosis not present

## 2015-03-12 LAB — COMPREHENSIVE METABOLIC PANEL
ALT: 1829 U/L — ABNORMAL HIGH (ref 17–63)
AST: 1969 U/L — AB (ref 15–41)
Albumin: 3.6 g/dL (ref 3.5–5.0)
Alkaline Phosphatase: 486 U/L — ABNORMAL HIGH (ref 38–126)
Anion gap: 10 (ref 5–15)
BUN: 20 mg/dL (ref 6–20)
CHLORIDE: 104 mmol/L (ref 101–111)
CO2: 26 mmol/L (ref 22–32)
Calcium: 9.1 mg/dL (ref 8.9–10.3)
Creatinine, Ser: 0.99 mg/dL (ref 0.61–1.24)
GFR calc Af Amer: >60 mL/min (ref >60)
Glucose, Bld: 95 mg/dL (ref 65–99)
POTASSIUM: 4.3 mmol/L (ref 3.5–5.1)
Sodium: 140 mmol/L (ref 135–145)
Total Bilirubin: 1.4 mg/dL — ABNORMAL HIGH (ref 0.3–1.2)
Total Protein: 6.8 g/dL (ref 6.5–8.1)

## 2015-03-12 LAB — HEPATITIS PANEL, ACUTE
Hep A IgM: NEGATIVE
Hep B C IgM: NEGATIVE
Hepatitis B Surface Ag: NEGATIVE

## 2015-03-12 LAB — LIPASE, BLOOD: LIPASE: 32 U/L (ref 22–51)

## 2015-03-12 LAB — PROTIME-INR
INR: 0.94 (ref 0.00–1.49)
Prothrombin Time: 12.8 seconds (ref 11.6–15.2)

## 2015-03-12 LAB — AMMONIA: Ammonia: 35 umol/L (ref 9–35)

## 2015-03-12 MED ORDER — IBUPROFEN 200 MG PO TABS
400.0000 mg | ORAL_TABLET | Freq: Four times a day (QID) | ORAL | Status: DC | PRN
  Filled 2015-03-12: qty 2

## 2015-03-12 NOTE — Progress Notes (Signed)
Subjective  Mild headache, otherwise no complaints, pt is eating all his meals, denies recurrence of abdominal pain which brought him to the hospital   Objective  Abnormal  LFT's still  getting more worrisome, AST 1969, ALT 1829,alk ph 486, INR 1.03, lipase is normal, amy 37 , bili 1.4 . Pt denies any abdominal pain, but again report he was having severe pain prior to the admission.  There is no clinical evidence of right sided heart failure, Tylenol levels were normal but he was taking up to 3gm/day ( could he have taken possibly more at times and not remember??)  Vital signs in last 24 hours: Temp:  [98.2 F (36.8 C)-98.3 F (36.8 C)] 98.2 F (36.8 C) (06/05 0421) Pulse Rate:  [60-88] 88 (06/05 0421) Resp:  [18] 18 (06/05 0421) BP: (111-155)/(63-75) 155/67 mmHg (06/05 0421) SpO2:  [93 %-99 %] 93 % (06/05 0421) Last BM Date: 03/10/15 General:    white male in NAD Heart:  Regular rate and rhythm; no murmurs Lungs: Respirations even and unlabored, lungs CTA bilaterally Abdomen:  Soft, nontender and nondistended.  2 well healed scars Normal bowel sounds. Extremities:  Without edema. Neurologic:  Alert and oriented,  grossly normal neurologically. Psych:  Cooperative. Normal mood and affect.  Intake/Output from previous day: 06/04 0701 - 06/05 0700 In: 720 [P.O.:720] Out: 350 [Urine:350] Intake/Output this shift:    Lab Results:  Recent Labs  03/10/15 1741 03/10/15 1754  WBC 6.0  --   HGB 13.0 14.6  HCT 40.4 43.0  PLT 180  --    BMET  Recent Labs  03/10/15 1746 03/10/15 1754 03/11/15 0504 03/12/15 0519  NA 138 138 140 140  K 4.3 4.3 3.8 4.3  CL 103 104 105 104  CO2 25  --  26 26  GLUCOSE 137* 135* 97 95  BUN 30* 32* 19 20  CREATININE 1.05 1.00 0.97 0.99  CALCIUM 9.1  --  9.3 9.1   LFT  Recent Labs  03/12/15 0519  PROT 6.8  ALBUMIN 3.6  AST 1969*  ALT 1829*  ALKPHOS 486*  BILITOT 1.4*   PT/INR  Recent Labs  03/11/15 0814  LABPROT 13.7  INR  1.03    Studies/Results: Dg Chest Port 1 View  03/10/2015   CLINICAL DATA:  Chest and epigastric pain beginning in this morning. Diaphoresis. Coronary artery disease and previous myocardial infarct.  EXAM: PORTABLE CHEST - 1 VIEW  COMPARISON:  02/01/2015  FINDINGS: The heart size and mediastinal contours are within normal limits. Both lungs are clear. The visualized skeletal structures are unremarkable.  IMPRESSION: No active disease.   Electronically Signed   By: Earle Gell M.D.   On: 03/10/2015 18:47   US Abdomen Limited Ruq  03/10/2015   CLINICAL DATA:  Elevated liver enzymes.  EXAM: US ABDOMEN LIMITED - RIGHT UPPER QUADRANT  COMPARISON:  CT scan of the abdomen dated 10/10/2009  FINDINGS: Gallbladder:  Removed  Common bile duct:  Diameter: 4.9 mm, normal.  Liver:  No focal lesion identified. Within normal limits in parenchymal echogenicity.  IMPRESSION: Normal exam.  The gallbladder has been removed.   Electronically Signed   By: Lorriane Shire M.D.   On: 03/10/2015 21:49       Assessment / Plan:   Rising transaminases but still normal synthetic function, all parameters need to be monitored daily, including ammonia, PT. I will schedule HIDA scan to r/o any possibility of retained CBD stone since his pain resembled biliary colic (  1986). Hold all medications which could even remotely affect his LFT's. May have Ibuprofen for head ache.  Active Problems:   CAD (coronary artery disease)   HTN (hypertension)   Chest pain   Elevated LFTs     LOS: 2 days   Delfin Edis  03/12/2015, 9:10 AM

## 2015-03-12 NOTE — Progress Notes (Signed)
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 This was followed by substernal chest pain with diaphoresis. In the ED patient had an unchanged EKG and normal troponin. LFTs were noted to be markedly elevated with AST of 435 and AST of 291, alkaline phosphatase of 136 with normal bilirubin. Ultrasound of the liver was unremarkable ( s/p cholecystectomy with normal CBD) . Patient admitted to hospitalist service for further management.    Assessment/Plan:  Acute transaminitis LFTs progressively worsening with AST of 1969 and AST of 1829, alkaline phosphatase of 4086, total bili of 1.4. Ammonia, INR normal. Acute hepatitis panel negative. Check CMV,  EBV and alpha-fetoprotein. Hepatitis panel sent. INR normal. Ammonia level mildly elevated. Tylenol level <10.  -Essential includes acute ischemic versus viral hepatitis, CBD stone vs HCC. Reports that she has noticed him to have lost weight recently.  monitor LFTs closely. Avoid hepatotoxic agents including tylenol and tramadol. Discontinued statin. -Appreciated GI evaluation. Plan on HIDA scan tomorrow.  Chest pain Appears atypical and currently resolved. Stable on telemetry and serial cardiac enzymes negative.  2-D echo with normal EF and no wall motion abnormality. Right Ventricle function normal..  DJD with chronic low back pain Has been following with PCP for chronic pain. Avoid Tylenol. Will place on NSAIDs when necessary. Discontinued tramadol.  Coronary artery disease Continue aspirin. Discontinued statin.  Essential  hypertension Stable. Continue lisinopril.   Dementia Mild. Continue Aricept.    Code Status: full code Family Communication: spoke with daughter Eric Cooley bedside Disposition Plan: Home once LFTs improved and workup completed.   Consultants:  [facility]  Procedures:  [place] abd  Antibiotics:  None  HPI/Subjective: Patient seen and examined. Denies any abdominal pain or chest discomfort. No nausea or vomiting.  Objective: Filed Vitals:   03/12/15 0421  BP: 155/67  Pulse: 88  Temp: 98.2 F (36.8 C)  Resp: 18    Intake/Output Summary (Last 24 hours) at 03/12/15 1331 Last data filed at 03/12/15 1114  Gross per 24 hour  Intake    240 ml  Output    750 ml  Net   -510 ml   Filed Weights   03/11/15 0055  Weight: 59.1 kg (130 lb 4.7 oz)    Exam:   General:   no acute distress  HEENT: No pallor, no icterus, moist oral mucosa,   Chest: Clear to auscultation bilaterally, no added sounds  CVS: Normal S1 and S2, no murmurs rub or gallop  GI: Soft, nondistended, nontender, bowel sounds present  Musculoskeletal: Warm, no edema  CNS: Alert and oriented  Data Reviewed: Basic Metabolic Panel:  Recent Labs Lab 03/10/15 1746 03/10/15 1754 03/11/15 0504 03/12/15 0519  NA 138 138 140 140  K 4.3 4.3 3.8 4.3  CL 103 104 105 104  CO2 25  --  26 26  GLUCOSE 137* 135* 97 95  BUN 30* 32* 19 20  CREATININE 1.05 1.00 0.97 0.99  CALCIUM 9.1  --  9.3 9.1   Liver Function Tests:  Recent Labs Lab 03/10/15 1746 03/11/15 0504 03/12/15 0519  AST 435* 1433* 1969*  ALT 291* 1024* 1829*  ALKPHOS  136* 283* 486*  BILITOT 0.8 1.0 1.4*  PROT 7.0 7.0 6.8  ALBUMIN 3.8 3.8 3.6    Recent Labs Lab 03/10/15 1746 03/12/15 0519  LIPASE 16* 32    Recent Labs Lab 03/11/15 0814 03/12/15 1123  AMMONIA 37* 35   CBC:  Recent Labs Lab 03/10/15 1741 03/10/15 1754  WBC 6.0  --   HGB 13.0 14.6  HCT 40.4 43.0  MCV 102.3*  --   PLT 180  --    Cardiac  Enzymes:  Recent Labs Lab 03/11/15 0018 03/11/15 0242 03/11/15 0504 03/11/15 0811  CKTOTAL 164  --   --   --   TROPONINI  --  <0.03 <0.03 <0.03   BNP (last 3 results) No results for input(s): BNP in the last 8760 hours.  ProBNP (last 3 results) No results for input(s): PROBNP in the last 8760 hours.  CBG:  Recent Labs Lab 03/10/15 1728  GLUCAP 137*    No results found for this or any previous visit (from the past 240 hour(s)).   Studies: Dg Chest Port 1 View  03/10/2015   CLINICAL DATA:  Chest and epigastric pain beginning in this morning. Diaphoresis. Coronary artery disease and previous myocardial infarct.  EXAM: PORTABLE CHEST - 1 VIEW  COMPARISON:  02/01/2015  FINDINGS: The heart size and mediastinal contours are within normal limits. Both lungs are clear. The visualized skeletal structures are unremarkable.  IMPRESSION: No active disease.   Electronically Signed   By: Earle Gell M.D.   On: 03/10/2015 18:47   US Abdomen Limited Ruq  03/10/2015   CLINICAL DATA:  Elevated liver enzymes.  EXAM: US ABDOMEN LIMITED - RIGHT UPPER QUADRANT  COMPARISON:  CT scan of the abdomen dated 10/10/2009  FINDINGS: Gallbladder:  Removed  Common bile duct:  Diameter: 4.9 mm, normal.  Liver:  No focal lesion identified. Within normal limits in parenchymal echogenicity.  IMPRESSION: Normal exam.  The gallbladder has been removed.   Electronically Signed   By: Lorriane Shire M.D.   On: 03/10/2015 21:49    Scheduled Meds: . aspirin  324 mg Oral Once  . enoxaparin (LOVENOX) injection  40 mg Subcutaneous QHS   Continuous Infusions:     Time spent: 25 minutes    [name]  [facility] Pager [phone] If 7PM-7AM, please contact night-coverage at [email or web address], password [facility] 03/12/2015, 1:31 PM  LOS: 2 days

## 2015-03-12 NOTE — Progress Notes (Signed)
Pt in bed AAOX3 no acute distress noted. Pt voice no complaints.

## 2015-03-13 ENCOUNTER — Observation Stay (HOSPITAL_COMMUNITY): Payer: Medicare Other

## 2015-03-13 DIAGNOSIS — R772 Abnormality of alphafetoprotein: Secondary | ICD-10-CM | POA: Diagnosis not present

## 2015-03-13 DIAGNOSIS — R945 Abnormal results of liver function studies: Secondary | ICD-10-CM | POA: Diagnosis present

## 2015-03-13 DIAGNOSIS — I10 Essential (primary) hypertension: Secondary | ICD-10-CM

## 2015-03-13 DIAGNOSIS — R7989 Other specified abnormal findings of blood chemistry: Secondary | ICD-10-CM | POA: Diagnosis present

## 2015-03-13 LAB — HEPATIC FUNCTION PANEL
ALT: 1050 U/L — AB (ref 17–63)
AST: 569 U/L — AB (ref 15–41)
Albumin: 3.2 g/dL — ABNORMAL LOW (ref 3.5–5.0)
Alkaline Phosphatase: 374 U/L — ABNORMAL HIGH (ref 38–126)
Bilirubin, Direct: 0.2 mg/dL (ref 0.1–0.5)
Indirect Bilirubin: 0.3 mg/dL (ref 0.3–0.9)
Total Bilirubin: 0.5 mg/dL (ref 0.3–1.2)
Total Protein: 6.2 g/dL — ABNORMAL LOW (ref 6.5–8.1)

## 2015-03-13 LAB — AFP TUMOR MARKER: AFP-Tumor Marker: 9.8 ng/mL — ABNORMAL HIGH (ref 0.0–8.3)

## 2015-03-13 LAB — PROTIME-INR
INR: 1.05 (ref 0.00–1.49)
PROTHROMBIN TIME: 13.9 s (ref 11.6–15.2)

## 2015-03-13 LAB — AMMONIA: Ammonia: 38 umol/L — ABNORMAL HIGH (ref 9–35)

## 2015-03-13 MED ORDER — SIMETHICONE 80 MG PO CHEW
160.0000 mg | CHEWABLE_TABLET | Freq: Four times a day (QID) | ORAL | Status: DC | PRN
  Administered 2015-03-13: 160 mg via ORAL
  Filled 2015-03-13 (×2): qty 2

## 2015-03-13 MED ORDER — TECHNETIUM TC 99M MEBROFENIN IV KIT
5.2000 | PACK | Freq: Once | INTRAVENOUS | Status: AC | PRN
  Administered 2015-03-13: 5 via INTRAVENOUS

## 2015-03-13 MED ORDER — GADOBENATE DIMEGLUMINE 529 MG/ML IV SOLN
12.0000 mL | Freq: Once | INTRAVENOUS | Status: AC | PRN
  Administered 2015-03-13: 12 mL via INTRAVENOUS

## 2015-03-13 NOTE — Progress Notes (Signed)
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 This was followed by substernal chest pain with diaphoresis. In the ED patient had an unchanged EKG and normal troponin. LFTs were noted to be markedly elevated with AST of 435 and AST of 291, alkaline phosphatase of 136 with normal bilirubin. Ultrasound of the liver was unremarkable ( s/p cholecystectomy with normal CBD) . Patient admitted to hospitalist service for further management.    Assessment/Plan:  Acute transaminitis LFTs worsened and peaked with  AST of 1969 and AST of 1829, alkaline phosphatase of 4086, total bili of 1.4. Improving this am.  Ammonia, INR normal. Acute hepatitis panel negative. Tylenol level normal. Check CMV,  EBV  Elevated AFP concerning for HCC. HIDA scan negative. D/w GI MRCP ordered  Hepatitis panel sent. INR normal. Ammonia level mildly elevated. Tylenol level <10.    Chest pain Appears atypical and currently resolved. Cardiac Enzymes negative. .  2-D echo with normal EF and no wall motion abnormality. Right Ventricle function normal..  DJD with chronic low back pain Has been following with PCP for chronic pain. Avoid Tylenol.  on NSAIDs when necessary. Discontinued tramadol.  Coronary artery disease Continue aspirin. Discontinued statin.  Essential hypertension Stable. Continue lisinopril.   Dementia Mild. Continue Aricept.    Code Status: full code Family Communication: will update daughter Disposition Plan: MRI pending .Home possibly tomorrow if LFTS  improving   Consultants:  [facility]  Procedures:  [place] abd  Antibiotics:  None  HPI/Subjective: Patient seen and examined. No overnight issues. Denies any symptoms.  Objective: Filed Vitals:   03/13/15 0445  BP: 115/53  Pulse: 63  Temp: 97.2 F (36.2 C)  Resp: 18    Intake/Output Summary (Last 24 hours) at 03/13/15 1245 Last data filed at 03/13/15 0076  Gross per 24 hour  Intake    480 ml  Output   1630 ml  Net  -1150 ml   Filed Weights   03/11/15 0055  Weight: 59.1 kg (130 lb 4.7 oz)    Exam:   General:   no acute distress  HEENT:no icterus, moist oral mucosa,   Chest: Clear bilaterally, no added sounds  CVS: Normal S1 and S2, no murmurs rub or gallop  GI: Soft, nondistended, nontender, bowel sounds present  Musculoskeletal: Warm, no edema    Data Reviewed: Basic Metabolic Panel:  Recent Labs Lab 03/10/15 1746 03/10/15 1754 03/11/15 0504 03/12/15 0519  NA 138 138 140 140  K 4.3 4.3 3.8 4.3  CL 103 104 105 104  CO2 25  --  26 26  GLUCOSE 137* 135* 97 95  BUN 30* 32* 19 20  CREATININE 1.05 1.00 0.97 0.99  CALCIUM 9.1  --  9.3 9.1   Liver Function Tests:  Recent Labs Lab 03/10/15 1746 03/11/15 0504 03/12/15 0519 03/13/15 0430  AST 435* 1433* 1969* 569*  ALT 291* 1024* 1829* 1050*  ALKPHOS 136* 283* 486* 374*  BILITOT 0.8 1.0 1.4* 0.5  PROT 7.0 7.0 6.8 6.2*  ALBUMIN 3.8 3.8 3.6 3.2*    Recent Labs Lab 03/10/15 1746 03/12/15 0519  LIPASE 16* 32  Recent Labs Lab 03/11/15 0814 03/12/15 1123 03/13/15 0430  AMMONIA 37* 35 38*   CBC:  Recent Labs Lab 03/10/15 1741 03/10/15 1754  WBC 6.0  --   HGB 13.0 14.6  HCT 40.4 43.0  MCV 102.3*  --   PLT 180  --    Cardiac Enzymes:  Recent Labs Lab 03/11/15 0018 03/11/15 0242 03/11/15 0504 03/11/15 0811  CKTOTAL 164  --   --   --   TROPONINI  --  <0.03 <0.03 <0.03   BNP (last 3 results) No results for input(s): BNP in the last 8760 hours.  ProBNP (last 3  results) No results for input(s): PROBNP in the last 8760 hours.  CBG:  Recent Labs Lab 03/10/15 1728  GLUCAP 137*    No results found for this or any previous visit (from the past 240 hour(s)).   Studies: Nm Hepatobiliary Including Gb  03/13/2015   CLINICAL DATA:  Abnormal liver function tests. Cholecystectomy in 1986. Abdominal pain and concern for retained CBD stone.  EXAM: NUCLEAR MEDICINE HEPATOBILIARY IMAGING  TECHNIQUE: Sequential images of the abdomen were obtained out to 60 minutes following intravenous administration of radiopharmaceutical.  RADIOPHARMACEUTICALS:  5.2 mCi Tc-25m Choletec IV  COMPARISON:  None.  FINDINGS: Despite the patient's liver function tests, there is prompt and homogeneous uptake into the liver with normal clearance of blood pool. The biliary tree is seen at 15 minutes and bowel activity is seen by 20 minutes. No gallbladder activity as expected post cholecystectomy. No bile leak.  IMPRESSION: Unremarkable hepatobiliary scan after cholecystectomy.   Electronically Signed   By: Monte Fantasia M.D.   On: 03/13/2015 10:02    Scheduled Meds: . aspirin  324 mg Oral Once  . enoxaparin (LOVENOX) injection  40 mg Subcutaneous QHS   Continuous Infusions:     Time spent: 25 minutes    Eric Cooley  [facility] Pager [phone] If 7PM-7AM, please contact night-coverage at [email or web address], password [facility] 03/13/2015, 12:45 PM  LOS: 3 days

## 2015-03-13 NOTE — Care Management Note (Signed)
Case Management Note  Patient Details  Name: VIREN LEBEAU MRN: [id number] Date of Birth: [birth year]  Subjective/Objective:  [facility] chosen for [facility].Await PT recommendations.                  Action/Plan:d/c plan home   Expected Discharge Date:                  Expected Discharge Plan:  [facility] chosen for [facility]. Await PT recommendations.)  In-House Referral:     Discharge planning Services  CM Consult  Post Acute Care Choice:    Choice offered to:  Patient  DME Arranged:    DME Agency:     HH Arranged:    [place] Agency:  [place]  Status of Service:  In process, will continue to follow  Medicare Important Message Given:    Date Medicare IM Given:    Medicare IM give by:    Date Additional Medicare IM Given:    Additional Medicare Important Message give by:     If discussed at [place] of Stay Meetings, dates discussed:    Additional Comments:  Dessa Phi, RN 03/13/2015, 4:30 PM

## 2015-03-13 NOTE — Progress Notes (Signed)
    Progress Note   Subjective  Back from HIDA. No further abdominal pain.    Objective   Vital signs in last 24 hours: Temp:  [97.2 F (36.2 C)-97.7 F (36.5 C)] 97.2 F (36.2 C) (06/06 0445) Pulse Rate:  [63-94] 63 (06/06 0445) Resp:  [18] 18 (06/06 0445) BP: (114-115)/(53-72) 115/53 mmHg (06/06 0445) SpO2:  [95 %-97 %] 95 % (06/06 0445) Last BM Date: 03/10/15 General:    white male in NAD Heart:  Regular rate and rhythm Lungs: Respirations even and unlabored, lungs CTA bilaterally Abdomen:  Soft, nontender and nondistended. Normal bowel sounds. Extremities:  Without edema. Neurologic:  Alert and oriented,  grossly normal neurologically. Psych:  Cooperative. Normal mood and affect.    Lab Results:  Recent Labs  03/10/15 1741 03/10/15 1754  WBC 6.0  --   HGB 13.0 14.6  HCT 40.4 43.0  PLT 180  --    BMET  Recent Labs  03/10/15 1746 03/10/15 1754 03/11/15 0504 03/12/15 0519  NA 138 138 140 140  K 4.3 4.3 3.8 4.3  CL 103 104 105 104  CO2 25  --  26 26  GLUCOSE 137* 135* 97 95  BUN 30* 32* 19 20  CREATININE 1.05 1.00 0.97 0.99  CALCIUM 9.1  --  9.3 9.1   LFT  Recent Labs  03/13/15 0430  PROT 6.2*  ALBUMIN 3.2*  AST 569*  ALT 1050*  ALKPHOS 374*  BILITOT 0.5  BILIDIR 0.2  IBILI 0.3   PT/INR  Recent Labs  03/12/15 1123 03/13/15 0430  LABPROT 12.8 13.9  INR 0.94 1.05    Studies/Results: Nm Hepatobiliary Including Gb  03/13/2015   CLINICAL DATA:  Abnormal liver function tests. Cholecystectomy in 1986. Abdominal pain and concern for retained CBD stone.  EXAM: NUCLEAR MEDICINE HEPATOBILIARY IMAGING  TECHNIQUE: Sequential images of the abdomen were obtained out to 60 minutes following intravenous administration of radiopharmaceutical.  RADIOPHARMACEUTICALS:  5.2 mCi Tc-8m Choletec IV  COMPARISON:  None.  FINDINGS: Despite the patient's liver function tests, there is prompt and homogeneous uptake into the liver with normal clearance of blood pool.  The biliary tree is seen at 15 minutes and bowel activity is seen by 20 minutes. No gallbladder activity as expected post cholecystectomy. No bile leak.  IMPRESSION: Unremarkable hepatobiliary scan after cholecystectomy.   Electronically Signed   By: Monte Fantasia M.D.   On: 03/13/2015 10:02     Assessment / Plan:   81 year old male admitted with RLQ pain and chest pain a few days ago, both now resolved. 2D echo normal. Troponin normal. LFTs found to be markedly abnormal (they were normal in February). Viral hep A, B and C acute antibodies are negative. EBV study pending. HIDA is negative. RUQ ultrasound unremarkable   Etiology of abnormal LFTs unclear but numbers improved overnight.  Will proceed with MRCP for further evaluation of biliary tree.       LOS: 3 days   Tye Savoy  03/13/2015, 12:45 PM     Attending physician's note   I have taken a history, examined the patient and reviewed the chart. I agree with the Advanced Practitioner's note, impression and recommendations. Patients daughter in room who is a Teacher, music from [place]. Pt has no abd pain or chest pain. LFTs improving. Awaiting MRCP report to R/O choledocholithiasis. AFP mildly elevated but with no hepatic lesions or history of cirrhosis, not likely significant.   Ladene Artist, MD [facility]

## 2015-03-13 NOTE — Care Management Note (Signed)
Case Management Note  Patient Details  Name: Eric Cooley MRN: [id number] Date of Birth: 1919/11/14  Subjective/Objective:  GI following-Hida scan today.                  Action/Plan:No anticipated d/c needs.   Expected Discharge Date:                  Expected Discharge Plan:  Home/Self Care  In-House Referral:     Discharge planning Services  CM Consult  Post Acute Care Choice:    Choice offered to:     DME Arranged:    DME Agency:     HH Arranged:    HH Agency:     Status of Service:  In process, will continue to follow  Medicare Important Message Given:    Date Medicare IM Given:    Medicare IM give by:    Date Additional Medicare IM Given:    Additional Medicare Important Message give by:     If discussed at [place] of Stay Meetings, dates discussed:    Additional Comments:  Dessa Phi, RN 03/13/2015, 9:22 AM

## 2015-03-14 ENCOUNTER — Telehealth: Payer: Self-pay | Admitting: *Deleted

## 2015-03-14 ENCOUNTER — Encounter (HOSPITAL_COMMUNITY): Payer: Self-pay | Admitting: Physician Assistant

## 2015-03-14 DIAGNOSIS — F03A0 Unspecified dementia, mild, without behavioral disturbance, psychotic disturbance, mood disturbance, and anxiety: Secondary | ICD-10-CM | POA: Diagnosis present

## 2015-03-14 DIAGNOSIS — I10 Essential (primary) hypertension: Secondary | ICD-10-CM | POA: Diagnosis not present

## 2015-03-14 DIAGNOSIS — R0789 Other chest pain: Secondary | ICD-10-CM

## 2015-03-14 DIAGNOSIS — F0390 Unspecified dementia without behavioral disturbance: Secondary | ICD-10-CM | POA: Diagnosis present

## 2015-03-14 DIAGNOSIS — R7989 Other specified abnormal findings of blood chemistry: Secondary | ICD-10-CM | POA: Diagnosis not present

## 2015-03-14 LAB — PROTIME-INR
INR: 0.96 (ref 0.00–1.49)
PROTHROMBIN TIME: 13 s (ref 11.6–15.2)

## 2015-03-14 LAB — HEPATIC FUNCTION PANEL
ALBUMIN: 3.2 g/dL — AB (ref 3.5–5.0)
ALK PHOS: 335 U/L — AB (ref 38–126)
ALT: 684 U/L — AB (ref 17–63)
AST: 243 U/L — ABNORMAL HIGH (ref 15–41)
Bilirubin, Direct: 0.1 mg/dL (ref 0.1–0.5)
Indirect Bilirubin: 0.7 mg/dL (ref 0.3–0.9)
Total Bilirubin: 0.8 mg/dL (ref 0.3–1.2)
Total Protein: 6.4 g/dL — ABNORMAL LOW (ref 6.5–8.1)

## 2015-03-14 LAB — AMMONIA: Ammonia: 35 umol/L (ref 9–35)

## 2015-03-14 LAB — EPSTEIN BARR VRS(EBV DNA BY PCR)
EBV DNA QN by PCR: NEGATIVE copies/mL
log10 EBV DNA Qn PCR: UNDETERMINED log10copy/mL

## 2015-03-14 MED ORDER — IBUPROFEN 400 MG PO TABS: 400.0000 mg | ORAL_TABLET | Freq: Four times a day (QID) | ORAL | Status: DC | PRN

## 2015-03-14 MED ORDER — TRAMADOL HCL 50 MG PO TABS: 50.0000 mg | ORAL_TABLET | Freq: Two times a day (BID) | ORAL | Status: DC | PRN

## 2015-03-14 NOTE — Telephone Encounter (Signed)
Transition Care Management Follow-up Telephone Call D/C 03/14/15  How have you been since you were released from the hospital? Pt states he is doing fairly well just came home this morning   Do you understand why you were in the hospital? YES   Do you understand the discharge instrcutions? YES  Items Reviewed:  Medications reviewed: YES  Allergies reviewed: YES  Dietary changes reviewed: NO  Referrals reviewed: YES, he stated that no one has contacted him yet, but he is ware of services  For PT   Functional Questionnaire:   Activities of Daily Living (ADLs):   He states he are independent in the following: ambulation, bathing and hygiene, feeding, continence, grooming, toileting and dressing States he require assistance with the following: ambulation   Any transportation issues/concerns?: NO   Any patient concerns? YES   Confirmed importance and date/time of follow-up visits scheduled: YES, appt made 03/21/15 w/Dr. Jenny Reichmann   Confirmed with patient if condition begins to worsen call PCP or go to the ER.

## 2015-03-14 NOTE — Discharge Summary (Signed)
Physician Discharge Summary  Eric Cooley YQI:[id number] DOB: 05-01-1920 DOA: 03/10/2015  PCP: Cathlean Cower, MD  Admit date: 03/10/2015 Discharge date: 03/14/2015  Time spent: <30 minutes  Recommendations for Outpatient Follow-up 1. D/c home with PCP follow up in 1 week 2. Please check LFTs during outpt visit. Also please follow up on CMV and EBV PCR.  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 This was followed by substernal chest pain with diaphoresis. In the ED patient had an unchanged EKG and normal troponin. LFTs were noted to be markedly elevated with AST of 435 and AST of 291, alkaline phosphatase of 136 with normal bilirubin. Ultrasound of the liver was unremarkable ( s/p cholecystectomy with normal CBD) . Patient admitted to hospitalist service for further management.  Hospital Course:  Acute transaminitis LFTs worsened and peaked with AST of 1969 and AST of 1829, alkaline phosphatase of 4086, total bili of 1.4. Ammonia, INR normal. Acute hepatitis panel negative. Tylenol level normal.  CMV, EBV pending Elevated AFP concerning for HCC. HIDA scan negative. MRCP was ordered which was unremarkable and did not show any hepatic mass. Patient is asymptomatic. LFTs have improved this morning with AST of 243, ALT of 684 and alkaline  phosphatase of 335 and normal total bilirubin. It is likely that patient may have passed a CBD stone vs an acute viral illness. Patient is clinically stable and can be discharged home with outpatient follow-up with PCP and will need repeat LFTs checked during the visit.    Chest pain Appears atypical and currently resolved. Cardiac Enzymes negative. . 2-D echo with normal EF and no wall motion abnormality. Right Ventricle function normal..  DJD with chronic low back pain Has been following with PCP for chronic pain. Instructed clearly to avoid Tylenol.Instructed to take Motrin  when necessary with food. Decreased dose of tramadol to twice a day when necessary.  Coronary artery disease Continue aspirin. Discontinued statin.  Essential hypertension Stable. Continue lisinopril.   Dementia Mild. Continue Aricept.    Code Status: full code Family Communication: Scars with daughter on the phone. Disposition Plan: Seen by physical therapy who recommended home health PT. Patient is stable to be discharged home. Will arrange home health services.   Consultants:  [facility]  Procedures:  [place] abd  HIDA scan  MRCP  Antibiotics:  None  Discharge Exam: Filed Vitals:   03/14/15 0415  BP: 151/92  Pulse: 68  Temp: 97.4 F (36.3 C)  Resp: 18    General: Elderly male in no acute distress HEENT: No pallor, moist oral mucosa, supple neck, no icterus Chest: Clear to auscultation bilaterally, no added sounds CVS: Normal S1 and S2, no murmurs rub or gallop GI: Soft, nondistended, nontender, bowel sounds present Musculoskeletal: Warm, no edema CNS: Alert and oriented  Discharge Instructions    Current Discharge Medication List  START taking these medications   Details  ibuprofen (ADVIL,MOTRIN) 400 MG tablet Take 1 tablet (400 mg total) by mouth every 6 (six) hours as needed for headache or moderate pain. Qty: 30 tablet, Refills: 0      CONTINUE these medications  which have CHANGED   Details  traMADol (ULTRAM) 50 MG tablet Take 1 tablet (50 mg total) by mouth every 12 (twelve) hours as needed for severe pain. Qty: 60 tablet, Refills: 0      CONTINUE these medications which have NOT CHANGED   Details  aspirin 81 MG EC tablet Take 1 tablet (81 mg total) by mouth daily. Qty: 30 tablet, Refills: 12    cetirizine (ZYRTEC) 10 MG tablet Take 1 tablet (10 mg total) by mouth daily. Qty: 30 tablet, Refills: 11    donepezil (ARICEPT) 5 MG tablet Take 1 tablet (5 mg total) by mouth at bedtime. Qty: 90 tablet, Refills: 3    lisinopril (PRINIVIL,ZESTRIL) 5 MG tablet Take 1 tablet (5 mg total) by mouth daily. Qty: 90 tablet, Refills: 3    pantoprazole (PROTONIX) 20 MG tablet Take 1 tablet (20 mg total) by mouth daily. Qty: 90 tablet, Refills: 3    triamcinolone (NASACORT AQ) 55 MCG/ACT AERO nasal inhaler Place 2 sprays into the nose daily. Qty: 1 Inhaler, Refills: 12      STOP taking these medications     acetaminophen (TYLENOL) 500 MG tablet      levofloxacin (LEVAQUIN) 250 MG tablet      simvastatin (ZOCOR) 10 MG tablet        No Known Allergies Follow-up Information    Follow up with [facility].   Contact information:   [address] [place] [address] [phone]       Follow up with Cathlean Cower, MD In 1 week.   Specialties:  Internal Medicine, Radiology   Contact information:   [place] [address] [phone]        The results of significant diagnostics from this hospitalization (including imaging, microbiology, ancillary and laboratory) are listed below for reference.    Significant Diagnostic Studies: Nm Hepatobiliary Including Gb  03/13/2015   CLINICAL DATA:  Abnormal liver function tests. Cholecystectomy in 1986. Abdominal pain and concern for retained CBD stone.  EXAM: NUCLEAR MEDICINE HEPATOBILIARY IMAGING  TECHNIQUE: Sequential images of the abdomen were obtained  out to 60 minutes following intravenous administration of radiopharmaceutical.  RADIOPHARMACEUTICALS:  5.2 mCi Tc-97m Choletec IV  COMPARISON:  None.  FINDINGS: Despite the patient's liver function tests, there is prompt and homogeneous uptake into the liver with normal clearance of blood pool. The biliary tree is seen at 15 minutes and bowel activity is seen by 20 minutes. No gallbladder activity as expected post cholecystectomy. No bile leak.  IMPRESSION: Unremarkable hepatobiliary scan after cholecystectomy.   Electronically Signed   By: Monte Fantasia M.D.   On: 03/13/2015 10:02   Mr 3d Recon At Scanner  03/13/2015   CLINICAL DATA:  Initial encounter for elevated LFTs with right upper quadrant pain.  EXAM: MRI ABDOMEN WITHOUT AND WITH CONTRAST (INCLUDING MRCP)  TECHNIQUE: Multiplanar multisequence MR imaging of the abdomen was performed both before and after the administration of intravenous contrast. Heavily T2-weighted images of the biliary and pancreatic ducts were obtained, and three-dimensional MRCP images were rendered by post processing.  CONTRAST:  72mL MULTIHANCE GADOBENATE DIMEGLUMINE 529 MG/ML IV SOLN  COMPARISON:  CT scan from 10/10/2009.  FINDINGS:  Lower chest:  Bibasilar compressive atelectasis is noted.  Hepatobiliary: Tiny hepatic cyst in the dome of the liver is unchanged. Other tiny hepatic cysts are new in the interval. Gallbladder is surgically absent. Mild prominence of the intrahepatic bile ducts is unchanged. Susceptibility artifact from the patient's left gastric vascular embolization coils obscures much of the pancreatic head and body. Despite this artifact, the common duct and common bile duct are visualized on many pulse sequences. Although the MRCP images are markedly degraded by the artifact, axial images show no focal dilatation of the extrahepatic biliary tree. There is no intraluminal filling defect within the extrahepatic common duct to suggest stone. The common bile duct  measured 6 mm in the head of the pancreas unchanged from the prior CT scan.  Pancreas: Susceptibility artifacts from the patient's left gastric vascular embolization coils obscures much of the pancreatic head and body. No evidence for mass lesion in the pancreatic tail.  Spleen: No splenomegaly. No focal mass lesion.  Adrenals/Urinary Tract: 9 mm left renal cyst was 4 mm previously. Very tiny cortical cysts are seen in the right kidney. No evidence for enhancing renal lesion on either side.  Stomach/Bowel: Stomach is largely obscured by susceptibility artifact. No evidence for small bowel obstruction. Abdominal segments of the colon appear grossly normal aside from some scattered diverticular change.  Vascular/Lymphatic: No abdominal aortic aneurysm. No evidence for abdominal lymphadenopathy with some obscuration of the retroperitoneal space by artifact.  Other: No intraperitoneal free fluid.  Musculoskeletal: No abnormal marrow signal or enhancement within the visualized bony anatomy.  IMPRESSION: 1. No CT findings to explain the patient's history of elevated LFTs and right upper quadrant pain. Mild intra and extrahepatic biliary duct distention is unchanged comparing back to a CT scan from 5 years ago. No evidence for choledocholithiasis on today's exam. 2. Hepatic and renal cysts.   Electronically Signed   By: Misty Stanley M.D.   On: 03/13/2015 15:00   Dg Chest Port 1 View  03/10/2015   CLINICAL DATA:  Chest and epigastric pain beginning in this morning. Diaphoresis. Coronary artery disease and previous myocardial infarct.  EXAM: PORTABLE CHEST - 1 VIEW  COMPARISON:  02/01/2015  FINDINGS: The heart size and mediastinal contours are within normal limits. Both lungs are clear. The visualized skeletal structures are unremarkable.  IMPRESSION: No active disease.   Electronically Signed   By: Earle Gell M.D.   On: 03/10/2015 18:47   Mr Jeananne Rama W/wo Cm/mrcp  03/13/2015   CLINICAL DATA:  Initial encounter for elevated  LFTs with right upper quadrant pain.  EXAM: MRI ABDOMEN WITHOUT AND WITH CONTRAST (INCLUDING MRCP)  TECHNIQUE: Multiplanar multisequence MR imaging of the abdomen was performed both before and after the administration of intravenous contrast. Heavily T2-weighted images of the biliary and pancreatic ducts were obtained, and three-dimensional MRCP images were rendered by post processing.  CONTRAST:  50mL MULTIHANCE GADOBENATE DIMEGLUMINE 529 MG/ML IV SOLN  COMPARISON:  CT scan from 10/10/2009.  FINDINGS: Lower chest:  Bibasilar compressive atelectasis is noted.  Hepatobiliary: Tiny hepatic cyst in the dome of the liver is unchanged. Other tiny hepatic cysts are new in the interval. Gallbladder is surgically absent. Mild prominence of the intrahepatic bile ducts is unchanged. Susceptibility artifact from the patient's left gastric vascular embolization coils obscures much of the pancreatic head and body. Despite this artifact, the common duct and common bile duct are visualized on many pulse sequences. Although the MRCP images are markedly degraded by the artifact, axial  images show no focal dilatation of the extrahepatic biliary tree. There is no intraluminal filling defect within the extrahepatic common duct to suggest stone. The common bile duct measured 6 mm in the head of the pancreas unchanged from the prior CT scan.  Pancreas: Susceptibility artifacts from the patient's left gastric vascular embolization coils obscures much of the pancreatic head and body. No evidence for mass lesion in the pancreatic tail.  Spleen: No splenomegaly. No focal mass lesion.  Adrenals/Urinary Tract: 9 mm left renal cyst was 4 mm previously. Very tiny cortical cysts are seen in the right kidney. No evidence for enhancing renal lesion on either side.  Stomach/Bowel: Stomach is largely obscured by susceptibility artifact. No evidence for small bowel obstruction. Abdominal segments of the colon appear grossly normal aside from some  scattered diverticular change.  Vascular/Lymphatic: No abdominal aortic aneurysm. No evidence for abdominal lymphadenopathy with some obscuration of the retroperitoneal space by artifact.  Other: No intraperitoneal free fluid.  Musculoskeletal: No abnormal marrow signal or enhancement within the visualized bony anatomy.  IMPRESSION: 1. No CT findings to explain the patient's history of elevated LFTs and right upper quadrant pain. Mild intra and extrahepatic biliary duct distention is unchanged comparing back to a CT scan from 5 years ago. No evidence for choledocholithiasis on today's exam. 2. Hepatic and renal cysts.   Electronically Signed   By: Misty Stanley M.D.   On: 03/13/2015 15:00   US Abdomen Limited Ruq  03/10/2015   CLINICAL DATA:  Elevated liver enzymes.  EXAM: US ABDOMEN LIMITED - RIGHT UPPER QUADRANT  COMPARISON:  CT scan of the abdomen dated 10/10/2009  FINDINGS: Gallbladder:  Removed  Common bile duct:  Diameter: 4.9 mm, normal.  Liver:  No focal lesion identified. Within normal limits in parenchymal echogenicity.  IMPRESSION: Normal exam.  The gallbladder has been removed.   Electronically Signed   By: Lorriane Shire M.D.   On: 03/10/2015 21:49    Microbiology: No results found for this or any previous visit (from the past 240 hour(s)).   Labs: Basic Metabolic Panel:  Recent Labs Lab 03/10/15 1746 03/10/15 1754 03/11/15 0504 03/12/15 0519  NA 138 138 140 140  K 4.3 4.3 3.8 4.3  CL 103 104 105 104  CO2 25  --  26 26  GLUCOSE 137* 135* 97 95  BUN 30* 32* 19 20  CREATININE 1.05 1.00 0.97 0.99  CALCIUM 9.1  --  9.3 9.1   Liver Function Tests:  Recent Labs Lab 03/10/15 1746 03/11/15 0504 03/12/15 0519 03/13/15 0430 03/14/15 0410  AST 435* 1433* 1969* 569* 243*  ALT 291* 1024* 1829* 1050* 684*  ALKPHOS 136* 283* 486* 374* 335*  BILITOT 0.8 1.0 1.4* 0.5 0.8  PROT 7.0 7.0 6.8 6.2* 6.4*  ALBUMIN 3.8 3.8 3.6 3.2* 3.2*    Recent Labs Lab 03/10/15 1746 03/12/15 0519   LIPASE 16* 32    Recent Labs Lab 03/11/15 0814 03/12/15 1123 03/13/15 0430 03/14/15 0410  AMMONIA 37* 35 38* 35   CBC:  Recent Labs Lab 03/10/15 1741 03/10/15 1754  WBC 6.0  --   HGB 13.0 14.6  HCT 40.4 43.0  MCV 102.3*  --   PLT 180  --    Cardiac Enzymes:  Recent Labs Lab 03/11/15 0018 03/11/15 0242 03/11/15 0504 03/11/15 0811  CKTOTAL 164  --   --   --   TROPONINI  --  <0.03 <0.03 <0.03   BNP: BNP (last 3 results) No results  for input(s): BNP in the last 8760 hours.  ProBNP (last 3 results) No results for input(s): PROBNP in the last 8760 hours.  CBG:  Recent Labs Lab 03/10/15 1728  GLUCAP 137*       Signed:  Oley Lahaie  [facility] 03/14/2015, 8:50 AM

## 2015-03-14 NOTE — Telephone Encounter (Signed)
PLEASE NOTE: All timestamps contained within this report are represented as [place] Standard Time. CONFIDENTIALTY NOTICE: This fax transmission is intended only for the addressee. It contains information that is legally privileged, confidential or otherwise protected from use or disclosure. If you are not the intended recipient, you are strictly prohibited from reviewing, disclosing, copying using or disseminating any of this information or taking any action in reliance on or regarding this information. If you have received this fax in error, please notify us immediately by telephone so that we can arrange for its return to [place]. Phone: [phone], Toll-Free: [phone], Fax: [phone] Page: 1 of 2 Call Id: [id number] [facility] Patient Name: Eric Cooley Gender: Male DOB: April 10, 1920 Age: 81 Y 10 M 14 D Return Phone Number: [phone] (Primary), [phone] (Secondary) Address: [address] City/State/Zip: [place]  [address] Client [facility] - Client Client Site [facility] [place] - Day Physician Cathlean Cower Contact Type Call Call Type Triage / Clinical Relationship To Patient Self Appointment Disposition EMR Appointment Attempted - Not Scheduled Info pasted into Epic Yes Return Phone Number [phone] (Primary) Chief Complaint Back Pain - General Initial Comment Back pain, nausea, sweating PreDisposition Did not know what to do Nurse Assessment Nurse: Mechele Dawley, RN, Amy Date/Time (Eastern Time): 03/10/2015 3:48:56 PM Confirm and document reason for call. If symptomatic, describe symptoms. ---caller states that he had some back pain, sweating and nausea. not sick recently. he states his pain was localized below the ribcage due to the pain. he feels like he was back to normal. Has the patient traveled out of the country within the last 30 days? ---Not  Applicable Does the patient require triage? ---Yes Related visit to physician within the last 2 weeks? ---No Does the PT have any chronic conditions? (i.e. diabetes, asthma, etc.) ---Yes List chronic conditions. ---stents, Guidelines Guideline Title Affirmed Question Affirmed Notes Nurse Date/Time (Eastern Time) Back Pain [1] SEVERE back pain (e.g., excruciating, unable to do any normal activities) AND [2] not improved 2 hours after pain medicine [place], RN, Amy 03/10/2015 3:51:51 PM Disp. Time Eilene Ghazi Time) Disposition Final User 03/10/2015 3:53:36 PM See Physician within 4 Hours (or PCP triage) Yes Mechele Dawley, RN, Amy Caller Understands: Yes PLEASE NOTE: All timestamps contained within this report are represented as [place] Standard Time. CONFIDENTIALTY NOTICE: This fax transmission is intended only for the addressee. It contains information that is legally privileged, confidential or otherwise protected from use or disclosure. If you are not the intended recipient, you are strictly prohibited from reviewing, disclosing, copying using or disseminating any of this information or taking any action in reliance on or regarding this information. If you have received this fax in error, please notify us immediately by telephone so that we can arrange for its return to [place]. Phone: [phone], Toll-Free: [phone], Fax: [phone] Page: 2 of 2 Call Id: [id number] Disagree/Comply: Comply Care Advice Given Per Guideline SEE PHYSICIAN WITHIN 4 HOURS (or PCP triage): CALL BACK IF: * You become worse. CARE ADVICE given per Back Pain (Adult) guideline. After Care Instructions Given Call Event Type User Date / Time Description Comments User: Susanne Borders, RN Date/Time Eilene Ghazi Time): 03/10/2015 4:06:48 PM nurse did an over-ride for him to be seen today at the ed. he went on to tell the nurse that he had a lot of gas today as well as he thought he was going to vomit, but had a bm instead. he  has not  been sick in order to have that episode. he was not feeling well for about 45 minutes and it seemed to pass. no md's available for him to be seen in the office this afternoon. he agreed with the outcome.

## 2015-03-14 NOTE — Evaluation (Signed)
Physical Therapy Evaluation Patient Details Name: Eric Cooley MRN: [id number] DOB: May 04, 1920 Today's Date: 03/14/2015   History of Present Illness  81 yo male admitted with HTN. hx of MI, dizziness, prostatce cancer, LBP, DJD, urinary incontinence.   Clinical Impression  On eval, pt was Min guard assist for mobility-able to ambulate ~135 feet without assistive device. Stumbled x2 (R foot). Recommend HHPT for balance training. Pt states he has RW, cane at home that he can use if he needs to.     Follow Up Recommendations Home health PT;Supervision - Intermittent    Equipment Recommendations  None recommended by PT (pt has RW and cane at home already)    Recommendations for Other Services       Precautions / Restrictions Precautions Precautions: Fall Restrictions Weight Bearing Restrictions: No      Mobility  Bed Mobility               General bed mobility comments: pt oob in recliner  Transfers Overall transfer level: Needs assistance   Transfers: Sit to/from Stand Sit to Stand: Min guard         General transfer comment: close guard for safety  Ambulation/Gait Ambulation/Gait assistance: Min guard Ambulation Distance (Feet): 175 Feet Assistive device: None Gait Pattern/deviations: Decreased stride length;Trunk flexed     General Gait Details: close guard for safety. Stumbles x 2 with R foot. R foot externally rotated. slow gait speed.   Stairs            Wheelchair Mobility    Modified Rankin (Stroke Patients Only)       Balance Overall balance assessment: Needs assistance;History of Falls         Standing balance support: No upper extremity supported Standing balance-Leahy Scale: Good Standing balance comment: static standing with EO/EC, narrow BOS, withstanding of external perturbations,k pick up of object-Min guard assist. Step-to test Min assist to stabilize.              High level balance activites: Side stepping;Backward  walking;Direction changes;Turns;Head turns High Level Balance Comments: Min guard assist and increased time for all tasks. Pt had difficulty with maintaining head turns while walking             Pertinent Vitals/Pain Pain Assessment: No/denies pain    Home Living Family/patient expects to be discharged to:: Private residence Living Arrangements: Children Available Help at Discharge: Family Type of Home: House Home Access: Stairs to enter   Secretary/administrator of Steps: 1 Home Layout: One level Home Equipment: Environmental consultant - 2 wheels;Cane - single point      Prior Function Level of Independence: Independent               Hand Dominance        Extremity/Trunk Assessment   Upper Extremity Assessment: Generalized weakness           Lower Extremity Assessment: Generalized weakness      Cervical / Trunk Assessment: Kyphotic  Communication   Communication: No difficulties  Cognition Arousal/Alertness: Awake/alert Behavior During Therapy: WFL for tasks assessed/performed Overall Cognitive Status: Within Functional Limits for tasks assessed                      General Comments      Exercises        Assessment/Plan    PT Assessment All further PT needs can be met in the next venue of care (HHPT)  PT Diagnosis Difficulty walking;Generalized weakness  PT Problem List Decreased balance;Decreased mobility;Decreased strength  PT Treatment Interventions     PT Goals (Current goals can be found in the Care Plan section) Acute Rehab PT Goals Patient Stated Goal: home PT Goal Formulation: All assessment and education complete, DC therapy    Frequency     Barriers to discharge        Co-evaluation               End of Session Equipment Utilized During Treatment: Gait belt Activity Tolerance: Patient tolerated treatment well Patient left: in chair;with call bell/phone within reach;with nursing/sitter in room      Functional Assessment  Tool Used: clinical judgement Functional Limitation: Mobility: Walking and moving around Mobility: Walking and Moving Around Current Status (T7409): At least 1 percent but less than 20 percent impaired, limited or restricted Mobility: Walking and Moving Around Goal Status [phone]): At least 1 percent but less than 20 percent impaired, limited or restricted Mobility: Walking and Moving Around Discharge Status [phone]): At least 1 percent but less than 20 percent impaired, limited or restricted    Time: 1121-1134 PT Time Calculation (min) (ACUTE ONLY): 13 min   Charges:   PT Evaluation $Initial PT Evaluation Tier I: 1 Procedure     PT G Codes:   PT G-Codes **NOT FOR INPATIENT CLASS** Functional Assessment Tool Used: clinical judgement Functional Limitation: Mobility: Walking and moving around Mobility: Walking and Moving Around Current Status (X8063): At least 1 percent but less than 20 percent impaired, limited or restricted Mobility: Walking and Moving Around Goal Status [phone]): At least 1 percent but less than 20 percent impaired, limited or restricted Mobility: Walking and Moving Around Discharge Status [phone]): At least 1 percent but less than 20 percent impaired, limited or restricted    Eric Cooley, MPT Pager: [phone]

## 2015-03-14 NOTE — Care Management Note (Signed)
Case Management Note  Patient Details  Name: Eric Cooley MRN: [id number] Date of Birth: [birth year]  Subjective/Objective:                    Action/Plan:PT recc-HHPT. D/c home w/[facility] HHRN/HHPT, rep aware of d/c & HHC orders. No further d/c needs.   Expected Discharge Date:                  Expected Discharge Plan:  [facility] chosen for [facility]. Await PT recommendations.)  In-House Referral:     Discharge planning Services  CM Consult  Post Acute Care Choice:    Choice offered to:  Patient  DME Arranged:    DME Agency:     HH Arranged:  RN, PT [place] Agency:  [facility]  Status of Service:  Completed, signed off  Medicare Important Message Given:    Date Medicare IM Given:    Medicare IM give by:    Date Additional Medicare IM Given:    Additional Medicare Important Message give by:     If discussed at [place] of Stay Meetings, dates discussed:    Additional Comments:  Dessa Phi, RN 03/14/2015, 11:50 AM

## 2015-03-15 ENCOUNTER — Telehealth: Payer: Self-pay | Admitting: Internal Medicine

## 2015-03-15 NOTE — Telephone Encounter (Signed)
[facility] [phone]   Discharged home from hospital yesterday,  Pt Declined PT

## 2015-03-16 LAB — CMV (CYTOMEGALOVIRUS) DNA ULTRAQUANT, PCR
CMV DNA QUANT: NEGATIVE [IU]/mL
Log10 CMV Qn DNA Pl: UNDETERMINED log10 IU/mL

## 2015-03-21 ENCOUNTER — Encounter: Payer: Self-pay | Admitting: Internal Medicine

## 2015-03-21 ENCOUNTER — Ambulatory Visit (INDEPENDENT_AMBULATORY_CARE_PROVIDER_SITE_OTHER): Payer: Medicare Other | Admitting: Internal Medicine

## 2015-03-21 VITALS — BP 152/72 | HR 72 | Temp 97.9°F | Ht 67.0 in | Wt 133.0 lb

## 2015-03-21 DIAGNOSIS — I10 Essential (primary) hypertension: Secondary | ICD-10-CM | POA: Diagnosis not present

## 2015-03-21 DIAGNOSIS — R7989 Other specified abnormal findings of blood chemistry: Secondary | ICD-10-CM

## 2015-03-21 DIAGNOSIS — F03A0 Unspecified dementia, mild, without behavioral disturbance, psychotic disturbance, mood disturbance, and anxiety: Secondary | ICD-10-CM

## 2015-03-21 DIAGNOSIS — F0390 Unspecified dementia without behavioral disturbance: Secondary | ICD-10-CM | POA: Diagnosis not present

## 2015-03-21 DIAGNOSIS — R945 Abnormal results of liver function studies: Secondary | ICD-10-CM

## 2015-03-21 NOTE — Patient Instructions (Addendum)

## 2015-03-21 NOTE — Assessment & Plan Note (Signed)
stable overall by history and exam,and pt to continue medical treatment as before,  to f/u any worsening symptoms or concerns Lab Results  Component Value Date   WBC 6.0 03/10/2015   HGB 14.6 03/10/2015   HCT 43.0 03/10/2015   PLT 180 03/10/2015   GLUCOSE 95 03/12/2015   CHOL 234* 11/11/2014   TRIG 93.0 11/11/2014   HDL 114.80 11/11/2014   LDLDIRECT 84.2 01/14/2013   LDLCALC 101* 11/11/2014   ALT 684* 03/14/2015   AST 243* 03/14/2015   NA 140 03/12/2015   K 4.3 03/12/2015   CL 104 03/12/2015   CREATININE 0.99 03/12/2015   BUN 20 03/12/2015   CO2 26 03/12/2015   TSH 3.56 11/11/2014   PSA 3.65 07/16/2013   INR 0.96 03/14/2015   HGBA1C  04/21/2009    6.0 (NOTE) The ADA recommends the following therapeutic goal for glycemic control related to Hgb A1c measurement: Goal of therapy: <6.5 Hgb A1c  Reference: American Diabetes Association: Clinical Practice Recommendations 2010, Diabetes Care, 2010, 33: (Suppl  1).

## 2015-03-21 NOTE — Assessment & Plan Note (Signed)
stable overall by history and exam, recent data reviewed with pt, and pt to continue medical treatment as before,  to f/u any worsening symptoms or concerns BP Readings from Last 3 Encounters:  03/21/15 152/72  03/14/15 151/92  02/01/15 118/68

## 2015-03-21 NOTE — Progress Notes (Signed)
Subjective:    Patient ID: Eric Cooley, male    DOB: Oct 14, 1919, 81 y.o.   MRN: [id number]  HPI   Here after recen thospn 6/3-6/7 after suddent onset RUQ pain, nausea, sweats, CP, found to have acute hepatitis unclear etiology, U/s neg for acute, admitted , MRI done after elevated AFP - no acute, EBV and CMV neg. LFT's improved, presumed then for ? CBD stone passed vs other Since leaving hospital has no further n/v, pain.  Pt denies fever, wt loss, night sweats, loss of appetite, or other constitutional symptoms  Did lose some wt with illness  Did stop tylenol for pain, declines other pain med. Wt Readings from Last 3 Encounters:  03/21/15 133 lb (60.328 kg)  03/11/15 130 lb 4.7 oz (59.1 kg)  02/01/15 136 lb (61.689 kg)  Pt denies chest pain, increased sob or doe, wheezing, orthopnea, PND, increased LE swelling, palpitations, dizziness or syncope. Denies worsening reflux, abd pain, dysphagia, n/v, bowel change or blood.  Past Medical History  Diagnosis Date  . Hypertension   . MI (myocardial infarction)   . Gastrointestinal stromal tumor (GIST) of stomach 05/2009, 10/2009    2010: gastric ulcer with GIST: IR embolization left gastric artery.  2011: partial gastrectomy  . CAD (coronary artery disease) 05/2012  . HTN (hypertension) 03/29/2011  . Hyperlipidemia 03/29/2011  . BPH (benign prostatic hypertrophy) 03/29/2011  . GERD (gastroesophageal reflux disease) 03/29/2011  . S/P cholecystectomy 03/29/2011  . 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 Allergic rhinitis, cause unspecified 03/29/2011  . Anemia 05/2009    blood loss anemia, transfused 6 units.   . Arthritis   . Urine incontinence   . Low back pain   . DJD (degenerative joint disease), lumbar 04/01/2011  . Prostate cancer 03/29/2011  . History of nuclear stress test 03/27/2010    exercise; no ischemic changes, normal study   . Colon polyp 05/2012    cecal, ascending tubular adenomas.    Past  Surgical History  Procedure Laterality Date  . Coronary angioplasty with stent placement  04/24/2009    2 Xience DES to LAD 3.5x74mm and 3.0x83mm overlapping  . Tonsillectomy  1951  . Cholecystectomy  1986  . Partial gastrectomy  10/2009    GIST  . Transthoracic echocardiogram  10/09/2009    EF 55-60%; normaml LV systolic function with grade 1 diastolic dysfunction  . Gastric artery emoblization Left 05/2009    reports that he quit smoking about 54 years ago. He has never used smokeless tobacco. He reports that he drinks about 0.6 oz of alcohol per week. He reports that he does not use illicit drugs. family history includes Arthritis in his father and mother; Heart failure in his mother; Lung cancer in his father; Ovarian cancer in his mother. There is no history of Colon cancer. No Known Allergies Current Outpatient Prescriptions on File Prior to Visit  Medication Sig Dispense Refill  . aspirin 81 MG EC tablet Take 1 tablet (81 mg total) by mouth daily. 30 tablet 12  . cetirizine (ZYRTEC) 10 MG tablet Take 1 tablet (10 mg total) by mouth daily. 30 tablet 11  . donepezil (ARICEPT) 5 MG tablet Take 1 tablet (5 mg total) by mouth at bedtime. 90 tablet 3  . ibuprofen (ADVIL,MOTRIN) 400 MG tablet Take 1 tablet (400 mg total) by mouth every 6 (six) hours as needed for headache or moderate pain. 30 tablet 0  . lisinopril (PRINIVIL,ZESTRIL) 5 MG tablet Take 1  tablet (5 mg total) by mouth daily. 90 tablet 3  . pantoprazole (PROTONIX) 20 MG tablet Take 1 tablet (20 mg total) by mouth daily. 90 tablet 3  . traMADol (ULTRAM) 50 MG tablet Take 1 tablet (50 mg total) by mouth every 12 (twelve) hours as needed for severe pain. 60 tablet 0  . triamcinolone (NASACORT AQ) 55 MCG/ACT AERO nasal inhaler Place 2 sprays into the nose daily. (Patient not taking: Reported on 03/10/2015) 1 Inhaler 12  . [DISCONTINUED] fluticasone (FLONASE) 50 MCG/ACT nasal spray Place 2 sprays into the nose daily.       No current  facility-administered medications on file prior to visit.   Review of Systems  Constitutional: Negative for unusual diaphoresis or night sweats HENT: Negative for ringing in ear or discharge Eyes: Negative for double vision or worsening visual disturbance.  Respiratory: Negative for choking and stridor.   Gastrointestinal: Negative for vomiting or other signifcant bowel change Genitourinary: Negative for hematuria or change in urine volume.  Musculoskeletal: Negative for other MSK pain or swelling Skin: Negative for color change and worsening wound.  Neurological: Negative for tremors and numbness other than noted  Psychiatric/Behavioral: Negative for decreased concentration or agitation other than above       Objective:   Physical Exam BP 152/72 mmHg  Pulse 72  Temp(Src) 97.9 F (36.6 C) (Oral)  Ht 5\' 7"  (1.702 m)  Wt 133 lb (60.328 kg)  BMI 20.83 kg/m2  SpO2 95% VS noted,  Constitutional: Pt appears in no significant distress HENT: Head: NCAT.  Right Ear: External ear normal.  Left Ear: External ear normal.  Eyes: . Pupils are equal, round, and reactive to light. Conjunctivae and EOM are normal Neck: Normal range of motion. Neck supple.  Cardiovascular: Normal rate and regular rhythm.   Pulmonary/Chest: Effort normal and breath sounds without rales or wheezing.  Abd:  Soft, NT, ND, + BS Neurological: Pt is alert. Not confused , motor grossly intact Skin: Skin is warm. No rash, no LE edema Psychiatric: Pt behavior is normal. No agitation.      Assessment & Plan:

## 2015-03-21 NOTE — Progress Notes (Signed)
Pre visit review using our clinic review tool, if applicable. No additional management support is needed unless otherwise documented below in the visit note. 

## 2015-03-21 NOTE — Assessment & Plan Note (Signed)
Asympt, likelyt improving, for f/u lFT's today

## 2015-03-28 ENCOUNTER — Telehealth: Payer: Self-pay | Admitting: Internal Medicine

## 2015-03-28 NOTE — Telephone Encounter (Signed)
Patient would like more info on referral to pain rehab, he ask if you could please call him today, he would like to pick info up today.

## 2015-03-28 NOTE — Telephone Encounter (Signed)
Patient called in regards seeing the other doctor that Dr. Jenny Reichmann suggested. Please call patient

## 2015-03-29 NOTE — Telephone Encounter (Signed)
Pt states that he got the information he was requesting from his insurance company, no further questions at this time

## 2015-03-30 ENCOUNTER — Ambulatory Visit (HOSPITAL_BASED_OUTPATIENT_CLINIC_OR_DEPARTMENT_OTHER): Payer: Medicare Other | Admitting: Physical Medicine & Rehabilitation

## 2015-03-30 ENCOUNTER — Ambulatory Visit: Payer: Medicare Other | Admitting: Physical Medicine & Rehabilitation

## 2015-03-30 ENCOUNTER — Encounter: Payer: Self-pay | Admitting: Physical Medicine & Rehabilitation

## 2015-03-30 ENCOUNTER — Encounter: Payer: Medicare Other | Attending: Physical Medicine & Rehabilitation

## 2015-03-30 ENCOUNTER — Other Ambulatory Visit: Payer: Self-pay | Admitting: Physical Medicine & Rehabilitation

## 2015-03-30 VITALS — BP 137/69 | HR 57 | Resp 14

## 2015-03-30 DIAGNOSIS — Z79899 Other long term (current) drug therapy: Secondary | ICD-10-CM | POA: Insufficient documentation

## 2015-03-30 DIAGNOSIS — M4124 Other idiopathic scoliosis, thoracic region: Secondary | ICD-10-CM | POA: Diagnosis not present

## 2015-03-30 DIAGNOSIS — G894 Chronic pain syndrome: Secondary | ICD-10-CM | POA: Insufficient documentation

## 2015-03-30 DIAGNOSIS — M791 Myalgia: Secondary | ICD-10-CM | POA: Insufficient documentation

## 2015-03-30 DIAGNOSIS — M545 Low back pain: Secondary | ICD-10-CM | POA: Diagnosis not present

## 2015-03-30 DIAGNOSIS — M7918 Myalgia, other site: Secondary | ICD-10-CM

## 2015-03-30 DIAGNOSIS — Z5181 Encounter for therapeutic drug level monitoring: Secondary | ICD-10-CM | POA: Insufficient documentation

## 2015-03-30 DIAGNOSIS — M419 Scoliosis, unspecified: Secondary | ICD-10-CM

## 2015-03-30 DIAGNOSIS — M47816 Spondylosis without myelopathy or radiculopathy, lumbar region: Secondary | ICD-10-CM | POA: Insufficient documentation

## 2015-03-30 NOTE — Progress Notes (Signed)
Subjective:    Patient ID: Eric Cooley, male    DOB: 08-30-1920, 81 y.o.   MRN: [id number] Chief complaint: Low back pain and right-sided HPI 81 year old male with 12 year history of low back pain this is mainly right-sided, insidious onset, no history of trauma. Pain worsened after a fall possibly 2 years ago where he dislocated his right shoulder. Pain has somewhat improved over this time but now states seems to be worsening once again. Patient did have physical therapy after the fall 2 years ago, they mainly concentrated on his shoulder and this was very helpful. He did not have any devoted physical therapy for his back. Patient had another fall in March 2016 , back pain may have worsened after that as well.. Has leg cramps but no shooting pains from the back down to the legs. Patient feels a little weaker in the legs, pain is not limiting his walking however. Bending and twisting exacerbates the pain, Prefers ice to heat, sitting seems to help the most.  Past history also positive for prostate carcinoma, no nighttime back pain. His pain is activity related. No change in the location of his low back pain over last  12 years or so. Pain Inventory Average Pain fluctuates with activity Pain Right Now 3 My pain is burning  In the last 24 hours, has pain interfered with the following? General activity 5 Relation with others 2 Enjoyment of life 5 What TIME of day is your pain at its worst? varies Sleep (in general) Good  Pain is worse with: walking, bending, standing and some activites Pain improves with: rest, heat/ice and medication Relief from Meds: 2  Mobility walk without assistance ability to climb steps?  yes do you drive?  yes  Function retired  Neuro/Psych No problems in this area  Prior Studies new visit  Physicians involved in your care new visit   Family History  Problem Relation Age of Onset  . Arthritis Mother   . Ovarian cancer Mother   . Heart  failure Mother   . Arthritis Father   . Lung cancer Father   . Colon cancer Neg Hx    History   Social History  . Marital Status: Widowed    Spouse Name: N/A  . Number of Children: 3  . Years of Education: 16   Occupational History  . retired Art gallery manager    Social History Main Topics  . Smoking status: Former Smoker    Quit date: 10/07/1960  . Smokeless tobacco: Never Used     Comment: Stopped smoking in 1962  . Alcohol Use: 0.6 oz/week    1 Glasses of wine per week     Comment: very rare  . Drug Use: No  . Sexual Activity: Not on file   Other Topics Concern  . None   Social History Narrative   Past Surgical History  Procedure Laterality Date  . Coronary angioplasty with stent placement  04/24/2009    2 Xience DES to LAD 3.5x80mm and 3.0x10mm overlapping  . Tonsillectomy  1951  . Cholecystectomy  1986  . Partial gastrectomy  10/2009    GIST  . Transthoracic echocardiogram  10/09/2009    EF 55-60%; normaml LV systolic function with grade 1 diastolic dysfunction  . Gastric artery emoblization Left 05/2009   Past Medical History  Diagnosis Date  . Hypertension   . MI (myocardial infarction)   . Gastrointestinal stromal tumor (GIST) of stomach 05/2009, 10/2009    2010: gastric  ulcer with GIST: IR embolization left gastric artery.  2011: partial gastrectomy  . CAD (coronary artery disease) 05/2012  . HTN (hypertension) 03/29/2011  . Hyperlipidemia 03/29/2011  . BPH (benign prostatic hypertrophy) 03/29/2011  . GERD (gastroesophageal reflux disease) 03/29/2011  . S/P cholecystectomy 03/29/2011  . 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 Allergic rhinitis, cause unspecified 03/29/2011  . Anemia 05/2009    blood loss anemia, transfused 6 units.   . Arthritis   . Urine incontinence   . Low back pain   . DJD (degenerative joint disease), lumbar 04/01/2011  . Prostate cancer 03/29/2011  . History of nuclear stress test 03/27/2010     exercise; no ischemic changes, normal study   . Colon polyp 05/2012    cecal, ascending tubular adenomas.    BP 137/69 mmHg  Pulse 57  Resp 14  SpO2 97%  Opioid Risk Score: 0 Fall Risk Score: Moderate Fall Risk (6-13 points)`1  Depression screen PHQ 2/9  Depression screen PHQ 2/9 03/30/2015  Decreased Interest 0  Down, Depressed, Hopeless 0  PHQ - 2 Score 0  Altered sleeping 0  Tired, decreased energy 0  Change in appetite 0  Feeling bad or failure about yourself  0  Trouble concentrating 0  Moving slowly or fidgety/restless 0  Suicidal thoughts 0  PHQ-9 Score 0     Review of Systems  All other systems reviewed and are negative.      Objective:   Physical Exam  Constitutional: He is oriented to person, place, and time. He appears well-developed and well-nourished.  HENT:  Head: Normocephalic and atraumatic.  Hard of hearing  Eyes: Conjunctivae and EOM are normal. Pupils are equal, round, and reactive to light.  Neck: Normal range of motion.  Cardiovascular: Normal rate and regular rhythm.   Pulmonary/Chest: Effort normal and breath sounds normal.  Abdominal: Soft. Bowel sounds are normal.  Musculoskeletal:  Evidence of thoracic scoliosis convex to right No compensatory lumbar scoliosis Tenderness to palpation along the inferior margin of T12 also pain on the right costovertebral angle. Range of motion is diminished by about 50% with flexion Patient has less than 25% of normal extension of the lumbar spine 50% of normal lateral bending to the right and to the left  Neurological: He is alert and oriented to person, place, and time.  Psychiatric: He has a normal mood and affect.  Nursing note and vitals reviewed.         Assessment & Plan:  1. Chronic right-sided low back pain. He has evidence of thoracic scoliosis and pain is right at the thoracic or lumbar junction. There is evidence of pain over the quadratus lumborum muscle insertion. This is likely a  multifactorial pain including chronic myofascial pain Upper lumbar facet syndrome  Recommend referral to outpatient physical therapy. Return to clinic one month if not better consider right sided T12 L1 L2 medial branch blocks At this point do not think he needs any further imaging studies, no red flags, no radicular symptoms  Discussed my findings with the patient, used anatomic model, discussed with daughter who is in room and taking notes

## 2015-03-30 NOTE — Patient Instructions (Signed)
Therapy will contact you to set up an appointment  My diagnosis is Quadratus lumborum myofascial pain Right convex thoracic scoliosis Upper right lumbar facet syndrome

## 2015-03-31 LAB — PRESCRIPTION MONITORING PROFILE (SOLSTAS)
Amphetamine/Meth: NEGATIVE ng/mL
Barbiturate Screen, Urine: NEGATIVE ng/mL
Benzodiazepine Screen, Urine: NEGATIVE ng/mL
Buprenorphine, Urine: NEGATIVE ng/mL
Cannabinoid Scrn, Ur: NEGATIVE ng/mL
Carisoprodol, Urine: NEGATIVE ng/mL
Cocaine Metabolites: NEGATIVE ng/mL
Creatinine, Urine: 33.46 mg/dL (ref >20.0)
Fentanyl, Ur: NEGATIVE ng/mL
MDMA URINE: NEGATIVE ng/mL
Meperidine, Ur: NEGATIVE ng/mL
Methadone Screen, Urine: NEGATIVE ng/mL
Nitrites, Initial: NEGATIVE ug/mL
Opiate Screen, Urine: NEGATIVE ng/mL
Oxycodone Screen, Ur: NEGATIVE ng/mL
Propoxyphene: NEGATIVE ng/mL
Tapentadol, urine: NEGATIVE ng/mL
Tramadol Scrn, Ur: NEGATIVE ng/mL
Zolpidem, Urine: NEGATIVE ng/mL
pH, Initial: 5 pH (ref 4.5–8.9)

## 2015-03-31 LAB — PMP ALCOHOL METABOLITE (ETG): Ethyl Glucuronide (EtG): NEGATIVE ng/mL

## 2015-03-31 NOTE — Telephone Encounter (Signed)
Pt saw Dr. Letta Pate on 6/23.

## 2015-04-03 ENCOUNTER — Emergency Department (HOSPITAL_COMMUNITY)
Admission: EM | Admit: 2015-04-03 | Discharge: 2015-04-03 | Disposition: A | Discharge status: Home / Self Care | Payer: Medicare Other | Attending: Emergency Medicine | Admitting: Emergency Medicine

## 2015-04-03 ENCOUNTER — Encounter (HOSPITAL_COMMUNITY): Payer: Self-pay | Admitting: Emergency Medicine

## 2015-04-03 DIAGNOSIS — Z8709 Personal history of other diseases of the respiratory system: Secondary | ICD-10-CM | POA: Insufficient documentation

## 2015-04-03 DIAGNOSIS — Z8639 Personal history of other endocrine, nutritional and metabolic disease: Secondary | ICD-10-CM | POA: Insufficient documentation

## 2015-04-03 DIAGNOSIS — Z862 Personal history of diseases of the blood and blood-forming organs and certain disorders involving the immune mechanism: Secondary | ICD-10-CM | POA: Diagnosis not present

## 2015-04-03 DIAGNOSIS — I252 Old myocardial infarction: Secondary | ICD-10-CM | POA: Insufficient documentation

## 2015-04-03 DIAGNOSIS — R197 Diarrhea, unspecified: Secondary | ICD-10-CM | POA: Diagnosis not present

## 2015-04-03 DIAGNOSIS — Z87891 Personal history of nicotine dependence: Secondary | ICD-10-CM | POA: Diagnosis not present

## 2015-04-03 DIAGNOSIS — Z7982 Long term (current) use of aspirin: Secondary | ICD-10-CM | POA: Insufficient documentation

## 2015-04-03 DIAGNOSIS — K219 Gastro-esophageal reflux disease without esophagitis: Secondary | ICD-10-CM | POA: Diagnosis not present

## 2015-04-03 DIAGNOSIS — Z9049 Acquired absence of other specified parts of digestive tract: Secondary | ICD-10-CM | POA: Diagnosis not present

## 2015-04-03 DIAGNOSIS — I2510 Atherosclerotic heart disease of native coronary artery without angina pectoris: Secondary | ICD-10-CM | POA: Insufficient documentation

## 2015-04-03 DIAGNOSIS — Z8546 Personal history of malignant neoplasm of prostate: Secondary | ICD-10-CM | POA: Insufficient documentation

## 2015-04-03 DIAGNOSIS — I10 Essential (primary) hypertension: Secondary | ICD-10-CM | POA: Insufficient documentation

## 2015-04-03 DIAGNOSIS — Z9861 Coronary angioplasty status: Secondary | ICD-10-CM | POA: Insufficient documentation

## 2015-04-03 DIAGNOSIS — Z86010 Personal history of colonic polyps: Secondary | ICD-10-CM | POA: Insufficient documentation

## 2015-04-03 DIAGNOSIS — Z79899 Other long term (current) drug therapy: Secondary | ICD-10-CM | POA: Insufficient documentation

## 2015-04-03 DIAGNOSIS — R1915 Other abnormal bowel sounds: Secondary | ICD-10-CM | POA: Diagnosis not present

## 2015-04-03 DIAGNOSIS — Z7951 Long term (current) use of inhaled steroids: Secondary | ICD-10-CM | POA: Insufficient documentation

## 2015-04-03 DIAGNOSIS — Z87448 Personal history of other diseases of urinary system: Secondary | ICD-10-CM | POA: Insufficient documentation

## 2015-04-03 DIAGNOSIS — M1990 Unspecified osteoarthritis, unspecified site: Secondary | ICD-10-CM | POA: Insufficient documentation

## 2015-04-03 DIAGNOSIS — R112 Nausea with vomiting, unspecified: Secondary | ICD-10-CM | POA: Diagnosis not present

## 2015-04-03 DIAGNOSIS — R42 Dizziness and giddiness: Secondary | ICD-10-CM | POA: Diagnosis not present

## 2015-04-03 LAB — URINALYSIS, ROUTINE W REFLEX MICROSCOPIC
Bilirubin Urine: NEGATIVE
Glucose, UA: NEGATIVE mg/dL
Ketones, ur: NEGATIVE mg/dL
LEUKOCYTES UA: NEGATIVE
Nitrite: NEGATIVE
PROTEIN: NEGATIVE mg/dL
Specific Gravity, Urine: 1.016 (ref 1.005–1.030)
Urobilinogen, UA: 0.2 mg/dL (ref 0.0–1.0)
pH: 6.5 (ref 5.0–8.0)

## 2015-04-03 LAB — COMPREHENSIVE METABOLIC PANEL
ALK PHOS: 109 U/L (ref 38–126)
ALT: 23 U/L (ref 17–63)
AST: 31 U/L (ref 15–41)
Albumin: 3.8 g/dL (ref 3.5–5.0)
Anion gap: 10 (ref 5–15)
BUN: 23 mg/dL — ABNORMAL HIGH (ref 6–20)
CHLORIDE: 101 mmol/L (ref 101–111)
CO2: 25 mmol/L (ref 22–32)
CREATININE: 0.98 mg/dL (ref 0.61–1.24)
Calcium: 9 mg/dL (ref 8.9–10.3)
GFR calc Af Amer: >60 mL/min (ref >60)
Glucose, Bld: 124 mg/dL — ABNORMAL HIGH (ref 65–99)
Potassium: 4.2 mmol/L (ref 3.5–5.1)
Sodium: 136 mmol/L (ref 135–145)
Total Bilirubin: 0.4 mg/dL (ref 0.3–1.2)
Total Protein: 7.1 g/dL (ref 6.5–8.1)

## 2015-04-03 LAB — CBC WITH DIFFERENTIAL/PLATELET
BASOS ABS: 0 10*3/uL (ref 0.0–0.1)
Basophils Relative: 0 % (ref 0–1)
EOS ABS: 0 10*3/uL (ref 0.0–0.7)
EOS PCT: 0 % (ref 0–5)
HEMATOCRIT: 39.7 % (ref 39.0–52.0)
Hemoglobin: 12.8 g/dL — ABNORMAL LOW (ref 13.0–17.0)
Lymphocytes Relative: 8 % — ABNORMAL LOW (ref 12–46)
Lymphs Abs: 0.7 10*3/uL (ref 0.7–4.0)
MCH: 32 pg (ref 26.0–34.0)
MCHC: 32.2 g/dL (ref 30.0–36.0)
MCV: 99.3 fL (ref 78.0–100.0)
MONOS PCT: 5 % (ref 3–12)
Monocytes Absolute: 0.5 10*3/uL (ref 0.1–1.0)
NEUTROS ABS: 7.7 10*3/uL (ref 1.7–7.7)
Neutrophils Relative %: 87 % — ABNORMAL HIGH (ref 43–77)
Platelets: 195 10*3/uL (ref 150–400)
RBC: 4 MIL/uL — ABNORMAL LOW (ref 4.22–5.81)
RDW: 13.5 % (ref 11.5–15.5)
WBC: 8.8 10*3/uL (ref 4.0–10.5)

## 2015-04-03 LAB — URINE MICROSCOPIC-ADD ON

## 2015-04-03 LAB — LIPASE, BLOOD: LIPASE: 13 U/L — AB (ref 22–51)

## 2015-04-03 MED ORDER — SODIUM CHLORIDE 0.9 % IV SOLN: INTRAVENOUS | Status: DC

## 2015-04-03 MED ORDER — ONDANSETRON HCL 8 MG PO TABS: 8.0000 mg | ORAL_TABLET | Freq: Three times a day (TID) | ORAL | Status: DC | PRN

## 2015-04-03 MED ORDER — SODIUM CHLORIDE 0.9 % IV BOLUS (SEPSIS)
500.0000 mL | Freq: Once | INTRAVENOUS | Status: AC
  Administered 2015-04-03: 500 mL via INTRAVENOUS

## 2015-04-03 MED ORDER — ONDANSETRON HCL 4 MG/2ML IJ SOLN
4.0000 mg | Freq: Once | INTRAMUSCULAR | Status: AC
  Administered 2015-04-03: 4 mg via INTRAVENOUS
  Filled 2015-04-03: qty 2

## 2015-04-03 NOTE — ED Notes (Addendum)
Pt here with complaints of dizziness, nausea and weakness.   He has had emesis x6 w/in last 24 hours.  "No energy"  Denies pain anywhere.  Urinating okay.  Appetite is good.  Pt states that starting about a month ago he began having cramps in his legs.  He denies any episodes of shortness of breath.

## 2015-04-03 NOTE — Progress Notes (Addendum)
Per chart review, patient currently active with [facility] for RN and PT.  Patient was admitted and discharged from hospital from 06/03 to 06/07.  Patient's pcp is Dr. Cathlean Cower whom the patient has seen on 06/14.  04/03/2015  2132pm A.Audryna Wendt RNCM [facility] went to speak to patient to confirm home health services.  Patient's wife reports he does not have home health services but was seen by a physician who has arranged out patient physical therapy for the patient.

## 2015-04-03 NOTE — ED Notes (Signed)
Pt tolerating po fluids.  Received approx 300 cc ivf.  IV dislodged.  Will d/c.  Voiding well.  Will do orthostatic vital signs and hold off on iv restart for now

## 2015-04-03 NOTE — ED Notes (Signed)
Ambulated patient around department.  Patient was assisted by staff member x1.  Patient unable to walk unassisted and continued to lean to the right while assistance was provided by staff.  MD made aware.  Patient given sandwich and drink.

## 2015-04-03 NOTE — ED Notes (Signed)
Pt started having vomiting around 8pm last night with some weakness and little diarrhea.  Pt states that he denies pain but has "gas in my stomach".

## 2015-04-03 NOTE — Discharge Instructions (Signed)
Dizziness  Dizziness means you feel unsteady or lightheaded. You might feel like you are going to pass out (faint). HOME CARE   Drink enough fluids to keep your pee (urine) clear or pale yellow.  Take your medicines exactly as told by your doctor. If you take blood pressure medicine, always stand up slowly from the lying or sitting position. Hold on to something to steady yourself.  If you need to stand in one place for a long time, move your legs often. Tighten and relax your leg muscles.  Have someone stay with you until you feel okay.  Do not drive or use heavy machinery if you feel dizzy.  Do not drink alcohol. GET HELP RIGHT AWAY IF:   You feel dizzy or lightheaded and it gets worse.  You feel sick to your stomach (nauseous), or you throw up (vomit).  You have trouble talking or walking.  You feel weak or have trouble using your arms, hands, or legs.  You cannot think clearly or have trouble forming sentences.  You have chest pain, belly (abdominal) pain, sweating, or you are short of breath.  Your vision changes.  You are bleeding.  You have problems from your medicine that seem to be getting worse. MAKE SURE YOU:   Understand these instructions.  Will watch your condition.  Will get help right away if you are not doing well or get worse. Document Released: 09/12/2011 Document Revised: 12/16/2011 Document Reviewed: 09/12/2011 [facility] Patient Information 2015 [place], [place]. This information is not intended to replace advice given to you by your health care provider. Make sure you discuss any questions you have with your health care provider.  Nausea and Vomiting Nausea is a sick feeling that often comes before throwing up (vomiting). Vomiting is a reflex where stomach contents come out of your mouth. Vomiting can cause severe loss of body fluids (dehydration). Children and elderly adults can become dehydrated quickly, especially if they also have diarrhea. Nausea  and vomiting are symptoms of a condition or disease. It is important to find the cause of your symptoms. CAUSES   Direct irritation of the stomach lining. This irritation can result from increased acid production (gastroesophageal reflux disease), infection, food poisoning, taking certain medicines (such as nonsteroidal anti-inflammatory drugs), alcohol use, or tobacco use.  Signals from the brain.These signals could be caused by a headache, heat exposure, an inner ear disturbance, increased pressure in the brain from injury, infection, a tumor, or a concussion, pain, emotional stimulus, or metabolic problems.  An obstruction in the gastrointestinal tract (bowel obstruction).  Illnesses such as diabetes, hepatitis, gallbladder problems, appendicitis, kidney problems, cancer, sepsis, atypical symptoms of a heart attack, or eating disorders.  Medical treatments such as chemotherapy and radiation.  Receiving medicine that makes you sleep (general anesthetic) during surgery. DIAGNOSIS Your caregiver may ask for tests to be done if the problems do not improve after a few days. Tests may also be done if symptoms are severe or if the reason for the nausea and vomiting is not clear. Tests may include:  Urine tests.  Blood tests.  Stool tests.  Cultures (to look for evidence of infection).  X-rays or other imaging studies. Test results can help your caregiver make decisions about treatment or the need for additional tests. TREATMENT You need to stay well hydrated. Drink frequently but in small amounts.You may wish to drink water, sports drinks, clear broth, or eat frozen ice pops or gelatin dessert to help stay hydrated.When you eat,  eating slowly may help prevent nausea.There are also some antinausea medicines that may help prevent nausea. HOME CARE INSTRUCTIONS   Take all medicine as directed by your caregiver.  If you do not have an appetite, do not force yourself to eat. However, you  must continue to drink fluids.  If you have an appetite, eat a normal diet unless your caregiver tells you differently.  Eat a variety of complex carbohydrates (rice, wheat, potatoes, bread), lean meats, yogurt, fruits, and vegetables.  Avoid high-fat foods because they are more difficult to digest.  Drink enough water and fluids to keep your urine clear or pale yellow.  If you are dehydrated, ask your caregiver for specific rehydration instructions. Signs of dehydration may include:  Severe thirst.  Dry lips and mouth.  Dizziness.  Dark urine.  Decreasing urine frequency and amount.  Confusion.  Rapid breathing or pulse. SEEK IMMEDIATE MEDICAL CARE IF:   You have blood or brown flecks (like coffee grounds) in your vomit.  You have black or bloody stools.  You have a severe headache or stiff neck.  You are confused.  You have severe abdominal pain.  You have chest pain or trouble breathing.  You do not urinate at least once every 8 hours.  You develop cold or clammy skin.  You continue to vomit for longer than 24 to 48 hours.  You have a fever. MAKE SURE YOU:   Understand these instructions.  Will watch your condition.  Will get help right away if you are not doing well or get worse. Document Released: 09/23/2005 Document Revised: 12/16/2011 Document Reviewed: 02/20/2011 [facility] Patient Information 2015 [place], [place]. This information is not intended to replace advice given to you by your health care provider. Make sure you discuss any questions you have with your health care provider.

## 2015-04-03 NOTE — ED Notes (Signed)
MD at bedside. 

## 2015-04-03 NOTE — ED Notes (Signed)
I attempted to collect patient blood and was unsuccessful.  I will attempted again when in room.  It was very difficult collect in the wheelchair.  I made nurse aware.

## 2015-04-03 NOTE — ED Notes (Signed)
Normal saline bolus infusing, zofran given.  Patient repositioned.  Currently sipping on some water.  Denies nausea at this time

## 2015-04-03 NOTE — ED Provider Notes (Signed)
CSN: [id number]     Arrival date & time 04/03/15  1146 History   First MD Initiated Contact with Patient 04/03/15 1458     Chief Complaint  Patient presents with  . Emesis  . Fatigue  . Diarrhea     (Consider location/radiation/quality/duration/timing/severity/associated sxs/prior Treatment) HPI   Eric Cooley is a 81 y.o. male who presents for evaluation of nausea, vomiting and diarrhea. Symptoms present for 2 days. Diarrhea is minimal. No blood in emesis. He has not been able to tolerate anything orally today. No fever, chills, or known sick contacts. No suspected abnormal food ingestion. He feels dizzy when he stands up and has fallen but did not hit the floor. He has not had any injuries. He presents for evaluation, with his daughter. He does with his son. No other recent illnesses. There are no other known modifying factors.   Past Medical History  Diagnosis Date  . Hypertension   . MI (myocardial infarction)   . Gastrointestinal stromal tumor (GIST) of stomach 05/2009, 10/2009    2010: gastric ulcer with GIST: IR embolization left gastric artery.  2011: partial gastrectomy  . CAD (coronary artery disease) 05/2012  . HTN (hypertension) 03/29/2011  . Hyperlipidemia 03/29/2011  . BPH (benign prostatic hypertrophy) 03/29/2011  . GERD (gastroesophageal reflux disease) 03/29/2011  . S/P cholecystectomy 03/29/2011  . 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 Allergic rhinitis, cause unspecified 03/29/2011  . Anemia 05/2009    blood loss anemia, transfused 6 units.   . Arthritis   . Urine incontinence   . Low back pain   . DJD (degenerative joint disease), lumbar 04/01/2011  . Prostate cancer 03/29/2011  . History of nuclear stress test 03/27/2010    exercise; no ischemic changes, normal study   . Colon polyp 05/2012    cecal, ascending tubular adenomas.    Past Surgical History  Procedure Laterality Date  . Coronary angioplasty with stent placement   04/24/2009    2 Xience DES to LAD 3.5x7mm and 3.0x22mm overlapping  . Tonsillectomy  1951  . Cholecystectomy  1986  . Partial gastrectomy  10/2009    GIST  . Transthoracic echocardiogram  10/09/2009    EF 55-60%; normaml LV systolic function with grade 1 diastolic dysfunction  . Gastric artery emoblization Left 05/2009   Family History  Problem Relation Age of Onset  . Arthritis Mother   . Ovarian cancer Mother   . Heart failure Mother   . Arthritis Father   . Lung cancer Father   . Colon cancer Neg Hx    History  Substance Use Topics  . Smoking status: Former Smoker    Quit date: 10/07/1960  . Smokeless tobacco: Never Used     Comment: Stopped smoking in 1962  . Alcohol Use: 0.6 oz/week    1 Glasses of wine per week     Comment: very rare    Review of Systems  All other systems reviewed and are negative.     Allergies  Review of patient's allergies indicates no known allergies.  Home Medications   Prior to Admission medications   Medication Sig Start Date End Date Taking? Authorizing Provider  cetirizine (ZYRTEC) 10 MG tablet Take 1 tablet (10 mg total) by mouth daily. 02/01/15  Yes Biagio Borg, MD  donepezil (ARICEPT) 5 MG tablet Take 1 tablet (5 mg total) by mouth at bedtime. 11/11/14  Yes Biagio Borg, MD  ibuprofen (ADVIL,MOTRIN) 400 MG tablet Take 1  tablet (400 mg total) by mouth every 6 (six) hours as needed for headache or moderate pain. 03/14/15  Yes Nishant Dhungel, MD  lisinopril (PRINIVIL,ZESTRIL) 5 MG tablet Take 1 tablet (5 mg total) by mouth daily. 11/11/14  Yes Biagio Borg, MD  pantoprazole (PROTONIX) 20 MG tablet Take 1 tablet (20 mg total) by mouth daily. 11/11/14  Yes Biagio Borg, MD  triamcinolone (NASACORT AQ) 55 MCG/ACT AERO nasal inhaler Place 2 sprays into the nose daily. 02/01/15  Yes Biagio Borg, MD  aspirin 81 MG EC tablet Take 1 tablet (81 mg total) by mouth daily. Patient not taking: Reported on 03/30/2015 05/04/13   Erby Pian, NP  traMADol  (ULTRAM) 50 MG tablet Take 1 tablet (50 mg total) by mouth every 12 (twelve) hours as needed for severe pain. Patient not taking: Reported on 03/30/2015 03/14/15   Nishant Dhungel, MD   BP 113/43 mmHg  Pulse 52  Temp(Src) 97.4 F (36.3 C) (Oral)  Resp 17  SpO2 99% Physical Exam  Constitutional: He is oriented to person, place, and time. He appears well-developed.  Elderly, frail  HENT:  Head: Normocephalic and atraumatic.  Right Ear: External ear normal.  Left Ear: External ear normal.  Eyes: Conjunctivae and EOM are normal. Pupils are equal, round, and reactive to light.  Neck: Normal range of motion and phonation normal. Neck supple.  Cardiovascular: Normal rate, regular rhythm and normal heart sounds.   Pulmonary/Chest: Effort normal and breath sounds normal. No respiratory distress. He exhibits no bony tenderness.  Abdominal: Soft. There is no tenderness. There is no rebound and no guarding.  Hypoactive bowel sounds. Abdomen is soft, nontender to palpation in all quadrants.  Musculoskeletal: Normal range of motion.  Neurological: He is alert and oriented to person, place, and time. No cranial nerve deficit or sensory deficit. He exhibits normal muscle tone. Coordination normal.  Skin: Skin is warm, dry and intact.  Psychiatric: He has a normal mood and affect. His behavior is normal.  Nursing note and vitals reviewed.   ED Course  Procedures (including critical care time) Medications  0.9 %  sodium chloride infusion (not administered)  sodium chloride 0.9 % bolus 500 mL (0 mLs Intravenous Stopped 04/03/15 1601)  ondansetron (ZOFRAN) injection 4 mg (4 mg Intravenous Given 04/03/15 1531)    Patient Vitals for the past 24 hrs:  BP Temp Temp src Pulse Resp SpO2  04/03/15 1833 (!) 113/43 mmHg - - (!) 52 17 99 %  04/03/15 1608 121/68 mmHg 97.4 F (36.3 C) Oral 65 18 95 %  04/03/15 1152 144/60 mmHg 97.8 F (36.6 C) Oral 65 15 98 %    8:29 PM Reevaluation with update and  discussion. After initial assessment and treatment, an updated evaluation reveals patient is comfortable now and feels like he could go home. He has been drinking water, and tolerating it without vomiting. He is not ready yet, as was requested earlier. IV was dislodged at 1600 hrs., and not been restarted as of yet. We will try ambulation trial, and reevaluate. Kathaleya Mcduffee L   21:05- . At this time, the patient is eating a sandwich, and tolerating well. He now relates intermittent episodes of dizziness about every 3 months, which last between one and 2 days. His dizziness causes him to have trouble walking. On ambulation trial here, he required support to walk. The patient currently has help at home to help him get around in the house. Additionally, he saw his rehabilitation physician last  week who has arranged for in-home physical therapy, by home health services. That is apparently going to be arranged in the next couple of days. Currently, the patient and his daughter feel that he is safe to go home.  Labs Review Labs Reviewed  CBC WITH DIFFERENTIAL/PLATELET - Abnormal; Notable for the following:    RBC 4.00 (*)    Hemoglobin 12.8 (*)    Neutrophils Relative % 87 (*)    Lymphocytes Relative 8 (*)    All other components within normal limits  COMPREHENSIVE METABOLIC PANEL - Abnormal; Notable for the following:    Glucose, Bld 124 (*)    BUN 23 (*)    All other components within normal limits  LIPASE, BLOOD - Abnormal; Notable for the following:    Lipase 13 (*)    All other components within normal limits  URINALYSIS, ROUTINE W REFLEX MICROSCOPIC (NOT AT [facility]) - Abnormal; Notable for the following:    APPearance CLOUDY (*)    Hgb urine dipstick TRACE (*)    All other components within normal limits  URINE MICROSCOPIC-ADD ON    Imaging Review No results found.   EKG Interpretation None      MDM   Final diagnoses:  Nausea and vomiting, vomiting of unspecified type  Dizziness     Nonspecific, vomiting, improved with treatment. Intermittent chronic and ongoing episodes of dizziness. I doubt CVA, metabolic instability or serious bacterial infection.   Nursing Notes Reviewed/ Care Coordinated Applicable Imaging Reviewed Interpretation of Laboratory Data incorporated into ED treatment  The patient appears reasonably screened and/or stabilized for discharge and I doubt any other medical condition or other [facility] requiring further screening, evaluation, or treatment in the ED at this time prior to discharge.  Plan: Home Medications- Zofran; Home Treatments- rest, Get assistance when ambulating; return here if the recommended treatment, does not improve the symptoms; Recommended follow up- PCP prn. Arrange in-home PT asap    Daleen Bo, MD 04/03/15 2114

## 2015-04-05 NOTE — Progress Notes (Signed)
Urine drug screen for this encounter is consistent for no prescribed or unprescribed medication

## 2015-04-27 ENCOUNTER — Encounter: Payer: Self-pay | Admitting: Physical Medicine & Rehabilitation

## 2015-04-27 ENCOUNTER — Encounter: Payer: Medicare Other | Attending: Physical Medicine & Rehabilitation

## 2015-04-27 ENCOUNTER — Ambulatory Visit (HOSPITAL_BASED_OUTPATIENT_CLINIC_OR_DEPARTMENT_OTHER): Payer: Medicare Other | Admitting: Physical Medicine & Rehabilitation

## 2015-04-27 VITALS — BP 159/58 | HR 68 | Resp 14

## 2015-04-27 DIAGNOSIS — M4124 Other idiopathic scoliosis, thoracic region: Secondary | ICD-10-CM

## 2015-04-27 DIAGNOSIS — M419 Scoliosis, unspecified: Secondary | ICD-10-CM

## 2015-04-27 DIAGNOSIS — G894 Chronic pain syndrome: Secondary | ICD-10-CM | POA: Diagnosis not present

## 2015-04-27 DIAGNOSIS — Z79899 Other long term (current) drug therapy: Secondary | ICD-10-CM | POA: Insufficient documentation

## 2015-04-27 DIAGNOSIS — M545 Low back pain: Secondary | ICD-10-CM | POA: Insufficient documentation

## 2015-04-27 DIAGNOSIS — M791 Myalgia: Secondary | ICD-10-CM | POA: Diagnosis not present

## 2015-04-27 DIAGNOSIS — M47816 Spondylosis without myelopathy or radiculopathy, lumbar region: Secondary | ICD-10-CM

## 2015-04-27 DIAGNOSIS — Z5181 Encounter for therapeutic drug level monitoring: Secondary | ICD-10-CM | POA: Diagnosis not present

## 2015-04-27 MED ORDER — TRAMADOL HCL 50 MG PO TABS: 50.0000 mg | ORAL_TABLET | Freq: Two times a day (BID) | ORAL | Status: DC | PRN

## 2015-04-27 NOTE — Patient Instructions (Signed)
Prescription for tramadol today  Therapy has been contacted and they should call you next week to schedule appointment  Please let us know if you do not hear from [facility] outpatient therapy on [address]

## 2015-04-27 NOTE — Progress Notes (Signed)
Subjective:    Patient ID: Eric Cooley, male    DOB: 08/27/1920, 81 y.o.   MRN: [id number]  HPI 81 yo male with hx of thoracic and upper lumbar scoliosis. No shooting pains or numbness or tingling  No falls or injury Pain Inventory Average Pain varies Pain Right Now 3 My pain is burning  In the last 24 hours, has pain interfered with the following? General activity 5 Relation with others 2 Enjoyment of life 5 What TIME of day is your pain at its worst? varies Sleep (in general) Good  Pain is worse with: walking, bending, standing and some activites Pain improves with: rest, heat/ice and medication Relief from Meds: 2  Mobility walk without assistance ability to climb steps?  yes do you drive?  yes  Function retired  Neuro/Psych No problems in this area  Prior Studies Any changes since last visit?  no  Physicians involved in your care Any changes since last visit?  no   Family History  Problem Relation Age of Onset  . Arthritis Mother   . Ovarian cancer Mother   . Heart failure Mother   . Arthritis Father   . Lung cancer Father   . Colon cancer Neg Hx    History   Social History  . Marital Status: Widowed    Spouse Name: N/A  . Number of Children: 3  . Years of Education: 16   Occupational History  . retired Art gallery manager    Social History Main Topics  . Smoking status: Former Smoker    Quit date: 10/07/1960  . Smokeless tobacco: Never Used     Comment: Stopped smoking in 1962  . Alcohol Use: 0.6 oz/week    1 Glasses of wine per week     Comment: very rare  . Drug Use: No  . Sexual Activity: Not on file   Other Topics Concern  . None   Social History Narrative   Past Surgical History  Procedure Laterality Date  . Coronary angioplasty with stent placement  04/24/2009    2 Xience DES to LAD 3.5x52mm and 3.0x72mm overlapping  . Tonsillectomy  1951  . Cholecystectomy  1986  . Partial gastrectomy  10/2009    GIST  .  Transthoracic echocardiogram  10/09/2009    EF 55-60%; normaml LV systolic function with grade 1 diastolic dysfunction  . Gastric artery emoblization Left 05/2009   Past Medical History  Diagnosis Date  . Hypertension   . MI (myocardial infarction)   . Gastrointestinal stromal tumor (GIST) of stomach 05/2009, 10/2009    2010: gastric ulcer with GIST: IR embolization left gastric artery.  2011: partial gastrectomy  . CAD (coronary artery disease) 05/2012  . HTN (hypertension) 03/29/2011  . Hyperlipidemia 03/29/2011  . BPH (benign prostatic hypertrophy) 03/29/2011  . GERD (gastroesophageal reflux disease) 03/29/2011  . S/P cholecystectomy 03/29/2011  . 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 Allergic rhinitis, cause unspecified 03/29/2011  . Anemia 05/2009    blood loss anemia, transfused 6 units.   . Arthritis   . Urine incontinence   . Low back pain   . DJD (degenerative joint disease), lumbar 04/01/2011  . Prostate cancer 03/29/2011  . History of nuclear stress test 03/27/2010    exercise; no ischemic changes, normal study   . Colon polyp 05/2012    cecal, ascending tubular adenomas.    BP 159/58 mmHg  Pulse 68  Resp 14  SpO2 96%  Opioid Risk Score:  Fall Risk Score:  `1  Depression screen PHQ 2/9  Depression screen PHQ 2/9 03/30/2015  Decreased Interest 0  Down, Depressed, Hopeless 0  PHQ - 2 Score 0  Altered sleeping 0  Tired, decreased energy 0  Change in appetite 0  Feeling bad or failure about yourself  0  Trouble concentrating 0  Moving slowly or fidgety/restless 0  Suicidal thoughts 0  PHQ-9 Score 0     Review of Systems  All other systems reviewed and are negative.      Objective:   Physical Exam  Constitutional: He is oriented to person, place, and time. He appears well-developed and well-nourished.  HENT:  Head: Normocephalic and atraumatic.  Eyes: Conjunctivae and EOM are normal. Pupils are equal, round, and reactive to  light.  Musculoskeletal:       Thoracic back: He exhibits decreased range of motion and deformity.  Neurological: He is alert and oriented to person, place, and time.  Psychiatric: He has a normal mood and affect.  Nursing note and vitals reviewed.  Thoracic scoliosis centered on the right at T10. Tenderness to palpation, minimal around T12-L1 area  Negative straight leg raising Motor strength is 5/5 bilateral hip flexor and extensor ankle dorsiflexor  Sit to stand is independent Gait with kyphotic posture but no evidence of toe drag or knee instability.  Standing balance is good      Assessment & Plan:  1. Chronic right-sided mid  Back pain evidence of scoliosis in the lower thoracic area dextroconvex.  Patient has not gone through physical therapy as recommended yet. Our office has contacted the physical therapy office and they are behind on the referrals. They should be contacting the patient this week or next week. Once the patient has trialed physical therapy will make further recommendations. Potentially may need T12 L1 L2 medial branch blocks.  Discussed with patient agrees with plan

## 2015-05-03 ENCOUNTER — Ambulatory Visit: Payer: Medicare Other | Attending: Physical Medicine & Rehabilitation | Admitting: Physical Therapy

## 2015-05-03 DIAGNOSIS — M545 Low back pain, unspecified: Secondary | ICD-10-CM

## 2015-05-03 DIAGNOSIS — M4124 Other idiopathic scoliosis, thoracic region: Secondary | ICD-10-CM | POA: Diagnosis present

## 2015-05-03 DIAGNOSIS — M62830 Muscle spasm of back: Secondary | ICD-10-CM | POA: Diagnosis present

## 2015-05-03 DIAGNOSIS — M436 Torticollis: Secondary | ICD-10-CM | POA: Insufficient documentation

## 2015-05-03 DIAGNOSIS — R293 Abnormal posture: Secondary | ICD-10-CM | POA: Insufficient documentation

## 2015-05-03 DIAGNOSIS — M419 Scoliosis, unspecified: Secondary | ICD-10-CM

## 2015-05-03 NOTE — Patient Instructions (Addendum)
   Bijou Easler PT, DPT, LAT, ATC  [facility] Phone: [phone]     

## 2015-05-03 NOTE — Therapy (Signed)
[place], [place], [address] Phone: [phone]   Fax:  [phone]  Physical Therapy Evaluation  Patient Details  Name: Eric Cooley MRN: [id number] Date of Birth: 08-16-1920 Referring Provider:  Charlett Blake, MD  Encounter Date: 05/03/2015      PT End of Session - 05/03/15 1736    Visit Number 1   Number of Visits 12   Date for PT Re-Evaluation 06/14/15   PT Start Time 8563   PT Stop Time 1630   PT Time Calculation (min) 45 min   Activity Tolerance Patient tolerated treatment well   Behavior During Therapy [facility] for tasks assessed/performed      Past Medical History  Diagnosis Date  . Hypertension   . MI (myocardial infarction)   . Gastrointestinal stromal tumor (GIST) of stomach 05/2009, 10/2009    2010: gastric ulcer with GIST: IR embolization left gastric artery.  2011: partial gastrectomy  . CAD (coronary artery disease) 05/2012  . HTN (hypertension) 03/29/2011  . Hyperlipidemia 03/29/2011  . BPH (benign prostatic hypertrophy) 03/29/2011  . GERD (gastroesophageal reflux disease) 03/29/2011  . S/P cholecystectomy 03/29/2011  . 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 Allergic rhinitis, cause unspecified 03/29/2011  . Anemia 05/2009    blood loss anemia, transfused 6 units.   . Arthritis   . Urine incontinence   . Low back pain   . DJD (degenerative joint disease), lumbar 04/01/2011  . Prostate cancer 03/29/2011  . History of nuclear stress test 03/27/2010    exercise; no ischemic changes, normal study   . Colon polyp 05/2012    cecal, ascending tubular adenomas.     Past Surgical History  Procedure Laterality Date  . Coronary angioplasty with stent placement  04/24/2009    2 Xience DES to LAD 3.5x25mm and 3.0x22mm overlapping  . Tonsillectomy  1951  . Cholecystectomy  1986  . Partial gastrectomy  10/2009    GIST  . Transthoracic echocardiogram  10/09/2009     EF 55-60%; normaml LV systolic function with grade 1 diastolic dysfunction  . Gastric artery emoblization Left 05/2009    There were no vitals filed for this visit.  Visit Diagnosis:  Right-sided low back pain without sciatica - Plan: PT plan of care cert/re-cert  Stiffness of cervical spine - Plan: PT plan of care cert/re-cert  Abnormal posture - Plan: PT plan of care cert/re-cert  Muscle spasm of back - Plan: PT plan of care cert/re-cert  Thoracic scoliosis - Plan: PT plan of care cert/re-cert      Subjective Assessment - 05/03/15 1554    Subjective pt is a 81 y.o M with CC or low back pain that started about 10 years ago that was mild and gradually worsened after a fall in 2014 falling on his face and elbow. Thoracic spine he reports he has continued to remain the same.    Patient is accompained by: Family member  daughter   Limitations Standing;Walking;House hold activities;Sitting;Lifting   How long can you sit comfortably? varys 10- 45 min   How long can you stand comfortably? 30 min   How long can you walk comfortably? 30 min   Diagnostic tests pt reports last month, and found athrtic changes   Patient Stated Goals to be pain free, to be able to do normal everyday tasks including cooking, prolong walking/ standing   Currently in Pain? Yes   Pain Score 3   at worse 7-8/10  Pain Location Back   Pain Orientation Mid;Right;Lower   Pain Descriptors / Indicators Sharp   Pain Type Chronic pain   Pain Onset More than a month ago   Pain Frequency Intermittent   Aggravating Factors  twisting and bending forward,    Pain Relieving Factors sit down and rest, ice pack            [facility] PT Assessment - 05/03/15 1603    Assessment   Medical Diagnosis low back pain and thoracic scoliosis   Onset Date/Surgical Date --  lumbar spine 10 years ago,    Hand Dominance Right   Next MD Visit --  about a month in August 2016   Prior Therapy Yes  low back   Precautions    Precautions None   Restrictions   Weight Bearing Restrictions No   Balance Screen   Has the patient fallen in the past 6 months Yes   How many times? 1   Has the patient had a decrease in activity level because of a fear of falling?  Yes   Is the patient reluctant to leave their home because of a fear of falling?  Yes   [place] Private residence   Living Arrangements Children   Available Help at Discharge Available 24 hours/day;Available PRN/intermittently   Type of Home House   Home Access Stairs to enter   Entrance Stairs-Number of Steps 2   Entrance Stairs-Rails Can reach both   Home Layout Two level   Alternate Level Stairs-Number of Steps 20   Alternate Level Stairs-Rails Can reach both   [place] - single point   Prior Function   Level of Independence Independent;Independent with basic ADLs   Vocation Retired   Leisure reading, Oceanographer, drive,    Cognition   Overall Cognitive Status Within Functional Limits for tasks assessed   Observation/Other Assessments   Focus on Therapeutic Outcomes (FOTO)  62% limited   predicted 47%   ROM / Strength   AROM / PROM / Strength AROM;Strength   AROM   AROM Assessment Site Lumbar   Lumbar Flexion 50   Lumbar Extension 18   Lumbar - Right Side Bend 26   Lumbar - Left Side Bend 26   Lumbar - Right Rotation 25%   Lumbar - Left Rotation 25%   Strength   Right/Left Hip Right;Left   Right Hip Flexion 4+/5   Right Hip ABduction 4+/5   Right Hip ADduction 5/5   Left Hip Flexion 4+/5   Left Hip ABduction 4/5   Left Hip ADduction 5/5   Right/Left Knee Right;Left   Right Knee Flexion 5/5   Right Knee Extension 5/5   Left Knee Flexion 5/5   Left Knee Extension 5/5   Standardized Balance Assessment   Standardized Balance Assessment Timed Up and Go Test                           PT Education - 05/03/15 1735    Education provided Yes   Education Details Evaluation  findings, POC, goals, HEP, anatomical education   Person(s) Educated Patient   Methods Explanation   Comprehension Verbalized understanding          PT Short Term Goals - 05/03/15 1743    PT SHORT TERM GOAL #1   Title PT will be I with basic HEP (05/24/2015)   Time 3   Period Weeks   Status  New   PT SHORT TERM GOAL #2   Title Pt will be able to verbalize and demonstrate techniques to reduce low back pain/reinjury via postural awareness, lifting and carrying mechanics, and HEP (05/24/2015)   Time 3   Period Weeks   Status New           PT Long Term Goals - May 15, 2015 1744    PT LONG TERM GOAL #1   Title upon discharge pt will be I with all HEp given throughout therapy (06/14/2015)   Time 6   Period Weeks   Status New   PT LONG TERM GOAL #2   Title pt will increase lumbar mobility by > 10 degrees in all planes to help with ADLS (01/07/3535)   Time 6   Period Weeks   Status New   PT LONG TERM GOAL #3   Title pt will be able to tolerate standing/walking for > 30-45 minutes with < 2/10 Pain to increase endurace with personal goal of being able to cook and walk more (06/14/2015)   Time 6   Period Weeks   Status New   PT LONG TERM GOAL #4   Title pt will be able to perform lifting and carrying activities of > 10# while maintaining good posture to help with ADLs (06/14/2015)   Time 6   Period Weeks   Status New   PT LONG TERM GOAL #5   Title pt will increase FOTO score to > 53 to indicated improvment of funcitonal capacity upon discharge (06/14/2015)   Time 6   Period Weeks   Status New               Plan - 05/15/15 1736    Clinical Impression Statement Rajendra presents to OPPT with report of low back pain that has gradually worsened since falling in 2014. He has a history of Right sided thoracic scoliosis.  He demonstrates limited trunk mobility in all planes secondary to pain and tightness in the low back. Palpation revealed hypomobility of Lumber invertebreal Posterio to  anterior movement with mild discomfort upon palapiton, as well tenderness at the R Quadratus lomborum and thoracolumbar parapsinals with R>L. HE demonstrates increased thoracic kyphosis and decreased lumbar lordosis with forward head posture and anteriorly rolled shoulders. He reports feeling unsteady and a little off balance. he would benefit from skilled physical therapy to maximize his function by addressing the impairments listed.    Pt will benefit from skilled therapeutic intervention in order to improve on the following deficits Abnormal gait;Decreased activity tolerance;Decreased range of motion;Decreased endurance;Hypomobility;Increased muscle spasms;Pain;Improper body mechanics;Postural dysfunction;Decreased balance   Rehab Potential Good   PT Frequency 2x / week   PT Duration 6 weeks   PT Treatment/Interventions ADLs/Self Care Home Management;Cryotherapy;Air traffic controller;Therapeutic activities;Therapeutic exercise;Balance training;Neuromuscular re-education;Patient/family education;Manual techniques;Passive range of motion;Dry needling;Taping   PT Next Visit Plan assess response to HEP, lumbar mobility, STM/instrument massage with parapsinals, thoracic mobility, Berg balance, quadratus lomborum stretching R>L   PT Home Exercise Plan See HEP handout   Consulted and Agree with Plan of Care Patient;Family member/caregiver   Family Member Consulted daughter          G-Codes - 15-May-2015 1750    Functional Assessment Tool Used FOTO 62% limitation   Functional Limitation Changing and maintaining body position   Changing and Maintaining Body Position Current Status [phone]) At least 60 percent but less than 80 percent impaired, limited or restricted   Changing and Maintaining Body Position Goal Status [phone])  At least 40 percent but less than 60 percent impaired, limited or restricted       Problem List Patient Active Problem List   Diagnosis Date  Noted  . Thoracic scoliosis 03/30/2015  . Facet syndrome, lumbar 03/30/2015  . Myofascial pain on right side 03/30/2015  . Mild dementia 03/14/2015  . Abnormal LFTs   . Chest pain 03/10/2015  . Left shoulder pain 02/01/2015  . Pleuritic chest pain 02/01/2015  . Abnormal CXR 12/16/2014  . Chronic back pain 12/16/2014  . Unsteady gait 12/02/2014  . Memory loss 07/19/2013  . Loss of weight 07/16/2013  . Chest pain with low risk for cardiac etiology 07/12/2013  . Protein-calorie malnutrition, severe 07/12/2013  . Subacute confusional state 05/28/2013  . Gait disorder 05/28/2013  . Subarachnoid hemorrhage following injury 04/16/2013  . Shoulder dislocation-right 04/16/2013  . Intermittent low back pain 04/15/2012  . Constipation 03/04/2012  . Dysuria 03/04/2012  . Pneumonia 01/29/2012  . Dizziness 12/18/2011  . Balance disorder 12/18/2011  . Pre-ulcerative corn or callous 10/13/2011  . DJD (degenerative joint disease), lumbar 04/01/2011  . CAD (coronary artery disease) 03/29/2011  . Prostate cancer 03/29/2011  . 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 HTN (hypertension) 03/29/2011  . Hyperlipidemia 03/29/2011  . BPH (benign prostatic hypertrophy) 03/29/2011  . GERD (gastroesophageal reflux disease) 03/29/2011  . Preventative health care 03/29/2011  . S/P cholecystectomy 03/29/2011  . Allergic rhinitis 03/29/2011  . S/P partial gastrectomy 03/29/2011  . Anemia 03/29/2011   Starr Lake PT, DPT, LAT, ATC  05/03/2015  5:52 PM    [facility] [address] [place], [place], [address] Phone: [phone]   Fax:  [phone]

## 2015-05-07 ENCOUNTER — Other Ambulatory Visit: Payer: Self-pay | Admitting: Internal Medicine

## 2015-05-08 ENCOUNTER — Other Ambulatory Visit: Payer: Self-pay | Admitting: Internal Medicine

## 2015-05-12 ENCOUNTER — Other Ambulatory Visit: Payer: Self-pay | Admitting: Physical Medicine & Rehabilitation

## 2015-05-12 ENCOUNTER — Telehealth: Payer: Self-pay | Admitting: Internal Medicine

## 2015-05-12 NOTE — Telephone Encounter (Signed)
Patient is under the care of Dr. Alysia Penna and he has prescribed him Tramadol. He went to pick up the prescription and they said this medication has ben blocked by Dr. Jenny Reichmann. Pharmacy is [facility] on [address]. Please advise patient [phone]

## 2015-05-12 NOTE — Telephone Encounter (Signed)
Pt advised that Rx was denied, this medication is managed by D Kirstein's office. Pt aware and will contact that office

## 2015-05-17 ENCOUNTER — Ambulatory Visit: Payer: Medicare Other | Attending: Physical Medicine & Rehabilitation | Admitting: Physical Therapy

## 2015-05-17 DIAGNOSIS — R293 Abnormal posture: Secondary | ICD-10-CM

## 2015-05-17 DIAGNOSIS — M62830 Muscle spasm of back: Secondary | ICD-10-CM

## 2015-05-17 DIAGNOSIS — M545 Low back pain, unspecified: Secondary | ICD-10-CM

## 2015-05-17 DIAGNOSIS — M436 Torticollis: Secondary | ICD-10-CM | POA: Insufficient documentation

## 2015-05-17 DIAGNOSIS — M4124 Other idiopathic scoliosis, thoracic region: Secondary | ICD-10-CM | POA: Diagnosis present

## 2015-05-17 NOTE — Therapy (Signed)
[place], [place], [address] Phone: [phone]   Fax:  [phone]  Physical Therapy Treatment  Patient Details  Name: Eric Cooley MRN: [id number] Date of Birth: [birth year] Referring Provider:  Biagio Borg, MD  Encounter Date: 05/17/2015      PT End of Session - 05/17/15 1723    Visit Number 2   Number of Visits 12   Date for PT Re-Evaluation 06/14/15   PT Start Time 1505   PT Stop Time 1550   PT Time Calculation (min) 45 min   Activity Tolerance Patient tolerated treatment well;No increased pain   Behavior During Therapy [facility] for tasks assessed/performed      Past Medical History  Diagnosis Date  . Hypertension   . MI (myocardial infarction)   . Gastrointestinal stromal tumor (GIST) of stomach 05/2009, 10/2009    2010: gastric ulcer with GIST: IR embolization left gastric artery.  2011: partial gastrectomy  . CAD (coronary artery disease) 05/2012  . HTN (hypertension) 03/29/2011  . Hyperlipidemia 03/29/2011  . BPH (benign prostatic hypertrophy) 03/29/2011  . GERD (gastroesophageal reflux disease) 03/29/2011  . S/P cholecystectomy 03/29/2011  . 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 Allergic rhinitis, cause unspecified 03/29/2011  . Anemia 05/2009    blood loss anemia, transfused 6 units.   . Arthritis   . Urine incontinence   . Low back pain   . DJD (degenerative joint disease), lumbar 04/01/2011  . Prostate cancer 03/29/2011  . History of nuclear stress test 03/27/2010    exercise; no ischemic changes, normal study   . Colon polyp 05/2012    cecal, ascending tubular adenomas.     Past Surgical History  Procedure Laterality Date  . Coronary angioplasty with stent placement  04/24/2009    2 Xience DES to LAD 3.5x24mm and 3.0x65mm overlapping  . Tonsillectomy  1951  . Cholecystectomy  1986  . Partial gastrectomy  10/2009    GIST  . Transthoracic  echocardiogram  10/09/2009    EF 55-60%; normaml LV systolic function with grade 1 diastolic dysfunction  . Gastric artery emoblization Left 05/2009    There were no vitals filed for this visit.  Visit Diagnosis:  Right-sided low back pain without sciatica  Abnormal posture  Muscle spasm of back      Subjective Assessment - 05/17/15 1517    Subjective 2 questions about home exercises.  No pain   Currently in Pain? No/denies                         [facility] Adult PT Treatment/Exercise - 05/17/15 1506    Berg Balance Test   Sit to Stand Able to stand  independently using hands   Standing Unsupported Able to stand safely 2 minutes   Sitting with Back Unsupported but Feet Supported on Floor or Stool Able to sit safely and securely 2 minutes   Stand to Sit Controls descent by using hands   Transfers Able to transfer safely, definite need of hands   Standing Unsupported with Eyes Closed Able to stand 10 seconds safely   Standing Ubsupported with Feet Together Able to place feet together independently and stand 1 minute safely   From Standing, Reach Forward with Outstretched Arm Can reach confidently >25 cm (10")   From Standing Position, Pick up Object from Floor Able to pick up shoe, needs supervision   From Standing Position, Turn to Look  Behind Over each Shoulder Looks behind from both sides and weight shifts well   Turn 360 Degrees Able to turn 360 degrees safely but slowly   Standing Unsupported, Alternately Place Feet on Step/Stool Able to stand independently and complete 8 steps >20 seconds   Standing Unsupported, One Foot in Front Needs help to step but can hold 15 seconds   Standing on One Leg Able to lift leg independently and hold equal to or more than 3 seconds   Total Score 44   Lumbar Exercises: Stretches   Passive Hamstring Stretch 3 reps;30 seconds   Lower Trunk Rotation --  10 reps 1 second hold   Pelvic Tilt --  10 reps 5 seconds.   Lumbar Exercises:  Supine   Clam 10 reps   Clam Limitations manually resisted, could not do in sidelying   Bridge 10 reps                  PT Short Term Goals - 05/17/15 1729    PT SHORT TERM GOAL #1   Title PT will be I with basic HEP (05/24/2015)   Baseline still needs cues   Time 3   Period Weeks   Status On-going   PT SHORT TERM GOAL #2   Title Pt will be able to verbalize and demonstrate techniques to reduce low back pain/reinjury via postural awareness, lifting and carrying mechanics, and HEP (05/24/2015)   Time 3   Period Weeks   Status On-going           PT Long Term Goals - 05/03/15 1744    PT LONG TERM GOAL #1   Title upon discharge pt will be I with all HEp given throughout therapy (06/14/2015)   Time 6   Period Weeks   Status New   PT LONG TERM GOAL #2   Title pt will increase lumbar mobility by > 10 degrees in all planes to help with ADLS (01/09/8184)   Time 6   Period Weeks   Status New   PT LONG TERM GOAL #3   Title pt will be able to tolerate standing/walking for > 30-45 minutes with < 2/10 Pain to increase endurace with personal goal of being able to cook and walk more (06/14/2015)   Time 6   Period Weeks   Status New   PT LONG TERM GOAL #4   Title pt will be able to perform lifting and carrying activities of > 10# while maintaining good posture to help with ADLs (06/14/2015)   Time 6   Period Weeks   Status New   PT LONG TERM GOAL #5   Title pt will increase FOTO score to > 53 to indicated improvment of funcitonal capacity upon discharge (06/14/2015)   Time 6   Period Weeks   Status New               Plan - 05/17/15 1725    Clinical Impression Statement Clarified home exercise questions.  Patient adherent.  Berg score 44/56.  Progress toward home exercise goal.  No pain at end of session.   PT Next Visit Plan Manual if painful.  Try nustep, hip strength.  work toward goals.   Consulted and Agree with Plan of Care Patient     Posture handout for  AFDL's   Problem List Patient Active Problem List   Diagnosis Date Noted  . Thoracic scoliosis 03/30/2015  . Facet syndrome, lumbar 03/30/2015  . Myofascial pain on right side 03/30/2015  .  Mild dementia 03/14/2015  . Abnormal LFTs   . Chest pain 03/10/2015  . Left shoulder pain 02/01/2015  . Pleuritic chest pain 02/01/2015  . Abnormal CXR 12/16/2014  . Chronic back pain 12/16/2014  . Unsteady gait 12/02/2014  . Memory loss 07/19/2013  . Loss of weight 07/16/2013  . Chest pain with low risk for cardiac etiology 07/12/2013  . Protein-calorie malnutrition, severe 07/12/2013  . Subacute confusional state 05/28/2013  . Gait disorder 05/28/2013  . Subarachnoid hemorrhage following injury 04/16/2013  . Shoulder dislocation-right 04/16/2013  . Intermittent low back pain 04/15/2012  . Constipation 03/04/2012  . Dysuria 03/04/2012  . Pneumonia 01/29/2012  . Dizziness 12/18/2011  . Balance disorder 12/18/2011  . Pre-ulcerative corn or callous 10/13/2011  . DJD (degenerative joint disease), lumbar 04/01/2011  . CAD (coronary artery disease) 03/29/2011  . Prostate cancer 03/29/2011  . 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 HTN (hypertension) 03/29/2011  . Hyperlipidemia 03/29/2011  . BPH (benign prostatic hypertrophy) 03/29/2011  . GERD (gastroesophageal reflux disease) 03/29/2011  . Preventative health care 03/29/2011  . S/P cholecystectomy 03/29/2011  . Allergic rhinitis 03/29/2011  . S/P partial gastrectomy 03/29/2011  . Anemia 03/29/2011    HARRIS,KAREN 05/17/2015, 5:30 PM  [facility] [address] [place], [place], [address] Phone: [phone]   Fax:  [phone]     Melvenia Needles, PTA 05/17/2015 5:30 PM Phone: [phone] Fax: [phone]

## 2015-05-22 ENCOUNTER — Ambulatory Visit: Payer: Medicare Other | Admitting: Physical Therapy

## 2015-05-22 DIAGNOSIS — M436 Torticollis: Secondary | ICD-10-CM

## 2015-05-22 DIAGNOSIS — M545 Low back pain, unspecified: Secondary | ICD-10-CM

## 2015-05-22 DIAGNOSIS — R293 Abnormal posture: Secondary | ICD-10-CM

## 2015-05-22 DIAGNOSIS — M419 Scoliosis, unspecified: Secondary | ICD-10-CM

## 2015-05-22 DIAGNOSIS — M62830 Muscle spasm of back: Secondary | ICD-10-CM

## 2015-05-22 NOTE — Therapy (Signed)
[place], [place], [address] Phone: [phone]   Fax:  [phone]  Physical Therapy Treatment  Patient Details  Name: Eric Cooley MRN: [id number] Date of Birth: Dec 22, 1919 Referring Provider:  Biagio Borg, MD  Encounter Date: 05/22/2015      PT End of Session - 05/22/15 1742    Visit Number 3   Number of Visits 12   Date for PT Re-Evaluation 06/14/15      Past Medical History  Diagnosis Date  . Hypertension   . MI (myocardial infarction)   . Gastrointestinal stromal tumor (GIST) of stomach 05/2009, 10/2009    2010: gastric ulcer with GIST: IR embolization left gastric artery.  2011: partial gastrectomy  . CAD (coronary artery disease) 05/2012  . HTN (hypertension) 03/29/2011  . Hyperlipidemia 03/29/2011  . BPH (benign prostatic hypertrophy) 03/29/2011  . GERD (gastroesophageal reflux disease) 03/29/2011  . S/P cholecystectomy 03/29/2011  . 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 Allergic rhinitis, cause unspecified 03/29/2011  . Anemia 05/2009    blood loss anemia, transfused 6 units.   . Arthritis   . Urine incontinence   . Low back pain   . DJD (degenerative joint disease), lumbar 04/01/2011  . Prostate cancer 03/29/2011  . History of nuclear stress test 03/27/2010    exercise; no ischemic changes, normal study   . Colon polyp 05/2012    cecal, ascending tubular adenomas.     Past Surgical History  Procedure Laterality Date  . Coronary angioplasty with stent placement  04/24/2009    2 Xience DES to LAD 3.5x84m and 3.0x172moverlapping  . Tonsillectomy  1951  . Cholecystectomy  1986  . Partial gastrectomy  10/2009    GIST  . Transthoracic echocardiogram  10/09/2009    EF 55-60%; normaml LV systolic function with grade 1 diastolic dysfunction  . Gastric artery emoblization Left 05/2009    There were no vitals filed for this visit.  Visit Diagnosis:   Abnormal posture  Right-sided low back pain without sciatica  Muscle spasm of back  Stiffness of cervical spine  Thoracic scoliosis      Subjective Assessment - 05/22/15 1021    Subjective Less am pain lately. Not sure if it is cionsistant.  I don't want to get my hopes up too much.     Currently in Pain? No/denies                         [facility] 1024    Lumbar Exercises: Stretches   Passive Hamstring Stretch 3 reps;30 seconds   Single Knee to Chest Stretch 5 reps  5 seconds, cues.   Lower Trunk Rotation 5 reps;10 seconds  low bach stiff.   Lumbar Exercises: Aerobic   Stationary Bike Nustep 6.5 minutes L4   Lumbar Exercises: Supine   Glut Set 10 reps  5 seconds. cues.   Bridge 10 reps   Other Supine Lumbar Exercises leg lengthener 10 regs multiple cues. each   Other Supine Lumbar Exercises leg press 10 reps 5 seconds   Lumbar Exercises: Sidelying   Clam 10 reps  unable to do las visit.                  PT Short Term Goals - 05/22/15 1105    PT SHORT TERM GOAL #1   Title PT will be I with basic HEP (05/24/2015)  Baseline indepennt.   Time 3   Period Weeks   Status Achieved   PT SHORT TERM GOAL #2   Title Pt will be able to verbalize and demonstrate techniques to reduce low back pain/reinjury via postural awareness, lifting and carrying mechanics, and HEP (05/24/2015)   Time 3   Period Weeks   Status On-going           PT Long Term Goals - 05/03/15 1744    PT LONG TERM GOAL #1   Title upon discharge pt will be I with all HEp given throughout therapy (06/14/2015)   Time 6   Period Weeks   Status New   PT LONG TERM GOAL #2   Title pt will increase lumbar mobility by > 10 degrees in all planes to help with ADLS (04/06/6966)   Time 6   Period Weeks   Status New   PT LONG TERM GOAL #3   Title pt will be able to tolerate standing/walking for > 30-45 minutes with < 2/10 Pain to increase endurace with  personal goal of being able to cook and walk more (06/14/2015)   Time 6   Period Weeks   Status New   PT LONG TERM GOAL #4   Title pt will be able to perform lifting and carrying activities of > 10# while maintaining good posture to help with ADLs (06/14/2015)   Time 6   Period Weeks   Status New   PT LONG TERM GOAL #5   Title pt will increase FOTO score to > 53 to indicated improvment of funcitonal capacity upon discharge (06/14/2015)   Time 6   Period Weeks   Status New               Plan - 05/22/15 1103    Clinical Impression Statement Hips now 3/5 .  able to do clams in sidelying.  Pain improving.  No new goals met  No pain at end of session.   PT Next Visit Plan clams to home.  Written.  Blue band issued today.  Stabilization.  Hip Hinge?   Consulted and Agree with Plan of Care Patient        Problem List Patient Active Problem List   Diagnosis Date Noted  . Thoracic scoliosis 03/30/2015  . Facet syndrome, lumbar 03/30/2015  . Myofascial pain on right side 03/30/2015  . Mild dementia 03/14/2015  . Abnormal LFTs   . Chest pain 03/10/2015  . Left shoulder pain 02/01/2015  . Pleuritic chest pain 02/01/2015  . Abnormal CXR 12/16/2014  . Chronic back pain 12/16/2014  . Unsteady gait 12/02/2014  . Memory loss 07/19/2013  . Loss of weight 07/16/2013  . Chest pain with low risk for cardiac etiology 07/12/2013  . Protein-calorie malnutrition, severe 07/12/2013  . Subacute confusional state 05/28/2013  . Gait disorder 05/28/2013  . Subarachnoid hemorrhage following injury 04/16/2013  . Shoulder dislocation-right 04/16/2013  . Intermittent low back pain 04/15/2012  . Constipation 03/04/2012  . Dysuria 03/04/2012  . Pneumonia 01/29/2012  . Dizziness 12/18/2011  . Balance disorder 12/18/2011  . Pre-ulcerative corn or callous 10/13/2011  . DJD (degenerative joint disease), lumbar 04/01/2011  . CAD (coronary artery disease) 03/29/2011  . Prostate cancer 03/29/2011  .  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 HTN (hypertension) 03/29/2011  . Hyperlipidemia 03/29/2011  . BPH (benign prostatic hypertrophy) 03/29/2011  . GERD (gastroesophageal reflux disease) 03/29/2011  . Preventative health care 03/29/2011  . S/P cholecystectomy 03/29/2011  .  Allergic rhinitis 03/29/2011  . S/P partial gastrectomy 03/29/2011  . Anemia 03/29/2011    HARRIS,KAREN 05/22/2015, 5:43 PM  [facility] [address] [place], [place], [address] Phone: [phone]   Fax:  [phone]     Melvenia Needles, PTA 05/22/2015 5:43 PM Phone: [phone] Fax: [phone]

## 2015-05-24 ENCOUNTER — Ambulatory Visit: Payer: Medicare Other | Admitting: Physical Therapy

## 2015-05-24 DIAGNOSIS — R293 Abnormal posture: Secondary | ICD-10-CM

## 2015-05-24 DIAGNOSIS — M545 Low back pain, unspecified: Secondary | ICD-10-CM

## 2015-05-24 DIAGNOSIS — M419 Scoliosis, unspecified: Secondary | ICD-10-CM

## 2015-05-24 DIAGNOSIS — M436 Torticollis: Secondary | ICD-10-CM

## 2015-05-24 DIAGNOSIS — M62830 Muscle spasm of back: Secondary | ICD-10-CM

## 2015-05-24 NOTE — Therapy (Signed)
[place], [place], [address] Phone: [phone]   Fax:  [phone]  Physical Therapy Treatment  Patient Details  Name: Eric Cooley MRN: [id number] Date of Birth: May 08, 1920 Referring Provider:  Biagio Borg, MD  Encounter Date: 05/24/2015      PT End of Session - 05/24/15 1602    Visit Number 4   Number of Visits 12   Date for PT Re-Evaluation 06/14/15   PT Start Time 1500   PT Stop Time 1545   PT Time Calculation (min) 45 min   Activity Tolerance Patient tolerated treatment well   Behavior During Therapy [facility] for tasks assessed/performed      Past Medical History  Diagnosis Date  . Hypertension   . MI (myocardial infarction)   . Gastrointestinal stromal tumor (GIST) of stomach 05/2009, 10/2009    2010: gastric ulcer with GIST: IR embolization left gastric artery.  2011: partial gastrectomy  . CAD (coronary artery disease) 05/2012  . HTN (hypertension) 03/29/2011  . Hyperlipidemia 03/29/2011  . BPH (benign prostatic hypertrophy) 03/29/2011  . GERD (gastroesophageal reflux disease) 03/29/2011  . S/P cholecystectomy 03/29/2011  . 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 Allergic rhinitis, cause unspecified 03/29/2011  . Anemia 05/2009    blood loss anemia, transfused 6 units.   . Arthritis   . Urine incontinence   . Low back pain   . DJD (degenerative joint disease), lumbar 04/01/2011  . Prostate cancer 03/29/2011  . History of nuclear stress test 03/27/2010    exercise; no ischemic changes, normal study   . Colon polyp 05/2012    cecal, ascending tubular adenomas.     Past Surgical History  Procedure Laterality Date  . Coronary angioplasty with stent placement  04/24/2009    2 Xience DES to LAD 3.5x24mm and 3.0x36mm overlapping  . Tonsillectomy  1951  . Cholecystectomy  1986  . Partial gastrectomy  10/2009    GIST  . Transthoracic echocardiogram  10/09/2009    EF  55-60%; normaml LV systolic function with grade 1 diastolic dysfunction  . Gastric artery emoblization Left 05/2009    There were no vitals filed for this visit.  Visit Diagnosis:  Abnormal posture  Right-sided low back pain without sciatica  Muscle spasm of back  Stiffness of cervical spine  Thoracic scoliosis      Subjective Assessment - 05/24/15 1508    Subjective "I am feeling a little sore today, I am not sure if it is from the muscle soreness the other day or not"   Currently in Pain? Yes   Pain Score 5    Pain Location Back   Pain Orientation Mid;Right;Lower   Pain Descriptors / Indicators Aching   Pain Type Chronic pain   Pain Onset More than a month ago   Pain Frequency Intermittent   Aggravating Factors  twisting, bending forward   Pain Relieving Factors sit down and rest, ice pack                         [facility] Adult PT Treatment/Exercise - 05/24/15 1510    Lumbar Exercises: Stretches   Passive Hamstring Stretch 3 reps;20 seconds   Single Knee to Chest Stretch 3 reps;20 seconds   Lower Trunk Rotation 5 reps;10 seconds   Lumbar Exercises: Aerobic   Stationary Bike L7 x 79min  using both UE/LE   Lumbar Exercises: Seated   Sit to Stand  5 reps  x 2 sets with 2 inch step beneath L foot    Lumbar Exercises: Supine   Glut Set 10 reps;2 seconds   Bridge 10 reps  VC to squeeze glutes at [employer] had to do 2 sets of 10    Other Supine Lumbar Exercises leg lengthener 10 regs multiple cues. each   Other Supine Lumbar Exercises leg press 10 reps 5 seconds  with manual resistance   Lumbar Exercises: Sidelying   Clam 10 reps   Manual Therapy   Manual Therapy Soft tissue mobilization   Soft tissue mobilization R lumbar/thoracic paraspinal STM, rolling with tennis ball  performed in L sidelying                PT Education - 05/24/15 1601    Education provided Yes   Education Details answered questions regarding scoliosis     Person(s) Educated Patient   Methods Explanation   Comprehension Verbalized understanding          PT Short Term Goals - 05/22/15 1105    PT SHORT TERM GOAL #1   Title PT will be I with basic HEP (05/24/2015)   Baseline indepennt.   Time 3   Period Weeks   Status Achieved   PT SHORT TERM GOAL #2   Title Pt will be able to verbalize and demonstrate techniques to reduce low back pain/reinjury via postural awareness, lifting and carrying mechanics, and HEP (05/24/2015)   Time 3   Period Weeks   Status On-going           PT Long Term Goals - 05/03/15 1744    PT LONG TERM GOAL #1   Title upon discharge pt will be I with all HEp given throughout therapy (06/14/2015)   Time 6   Period Weeks   Status New   PT LONG TERM GOAL #2   Title pt will increase lumbar mobility by > 10 degrees in all planes to help with ADLS (05/10/6658)   Time 6   Period Weeks   Status New   PT LONG TERM GOAL #3   Title pt will be able to tolerate standing/walking for > 30-45 minutes with < 2/10 Pain to increase endurace with personal goal of being able to cook and walk more (06/14/2015)   Time 6   Period Weeks   Status New   PT LONG TERM GOAL #4   Title pt will be able to perform lifting and carrying activities of > 10# while maintaining good posture to help with ADLs (06/14/2015)   Time 6   Period Weeks   Status New   PT LONG TERM GOAL #5   Title pt will increase FOTO score to > 53 to indicated improvment of funcitonal capacity upon discharge (06/14/2015)   Time 6   Period Weeks   Status New               Plan - 05/24/15 1602    Clinical Impression Statement Eric Cooley presents to therapy today with report of feeling soreness in the low back possibly due to delayed onset muscle sorenss. He was able to perform and complete all exercises requiring VC for proper form. performed exercises to help promoted pelvic leveling via leg lengthing exercises, and sit to stand with L foot on 2 in step to help  push the hip up.    PT Next Visit Plan clams to home.  Written.  Blue band issued today.  Stabilization.  Hip Hinge, manual for paraspinals   Consulted and Agree with Plan of Care Patient        Problem List Patient Active Problem List   Diagnosis Date Noted  . Thoracic scoliosis 03/30/2015  . Facet syndrome, lumbar 03/30/2015  . Myofascial pain on right side 03/30/2015  . Mild dementia 03/14/2015  . Abnormal LFTs   . Chest pain 03/10/2015  . Left shoulder pain 02/01/2015  . Pleuritic chest pain 02/01/2015  . Abnormal CXR 12/16/2014  . Chronic back pain 12/16/2014  . Unsteady gait 12/02/2014  . Memory loss 07/19/2013  . Loss of weight 07/16/2013  . Chest pain with low risk for cardiac etiology 07/12/2013  . Protein-calorie malnutrition, severe 07/12/2013  . Subacute confusional state 05/28/2013  . Gait disorder 05/28/2013  . Subarachnoid hemorrhage following injury 04/16/2013  . Shoulder dislocation-right 04/16/2013  . Intermittent low back pain 04/15/2012  . Constipation 03/04/2012  . Dysuria 03/04/2012  . Pneumonia 01/29/2012  . Dizziness 12/18/2011  . Balance disorder 12/18/2011  . Pre-ulcerative corn or callous 10/13/2011  . DJD (degenerative joint disease), lumbar 04/01/2011  . CAD (coronary artery disease) 03/29/2011  . Prostate cancer 03/29/2011  . 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 HTN (hypertension) 03/29/2011  . Hyperlipidemia 03/29/2011  . BPH (benign prostatic hypertrophy) 03/29/2011  . GERD (gastroesophageal reflux disease) 03/29/2011  . Preventative health care 03/29/2011  . S/P cholecystectomy 03/29/2011  . Allergic rhinitis 03/29/2011  . S/P partial gastrectomy 03/29/2011  . Anemia 03/29/2011   Starr Lake PT, DPT, LAT, ATC  05/24/2015  4:05 PM    [facility] [address] [place], [place], [address] Phone: [phone]   Fax:  [phone]

## 2015-05-29 ENCOUNTER — Ambulatory Visit: Payer: Medicare Other | Admitting: Physical Therapy

## 2015-05-29 DIAGNOSIS — M545 Low back pain, unspecified: Secondary | ICD-10-CM

## 2015-05-29 DIAGNOSIS — R293 Abnormal posture: Secondary | ICD-10-CM

## 2015-05-29 DIAGNOSIS — M62830 Muscle spasm of back: Secondary | ICD-10-CM

## 2015-05-29 DIAGNOSIS — M419 Scoliosis, unspecified: Secondary | ICD-10-CM

## 2015-05-29 NOTE — Therapy (Signed)
[place], [place], [address] Phone: [phone]   Fax:  [phone]  Physical Therapy Treatment  Patient Details  Name: Eric Cooley MRN: [id number] Date of Birth: November 20, 1919 Referring Provider:  Biagio Borg, MD  Encounter Date: 05/29/2015      PT End of Session - 05/29/15 1517    Visit Number 5   Number of Visits 12   Date for PT Re-Evaluation 06/14/15   PT Start Time 6962   PT Stop Time 1505   PT Time Calculation (min) 50 min   Activity Tolerance Patient tolerated treatment well;No increased pain   Behavior During Therapy [facility] for tasks assessed/performed      Past Medical History  Diagnosis Date  . Hypertension   . MI (myocardial infarction)   . Gastrointestinal stromal tumor (GIST) of stomach 05/2009, 10/2009    2010: gastric ulcer with GIST: IR embolization left gastric artery.  2011: partial gastrectomy  . CAD (coronary artery disease) 05/2012  . HTN (hypertension) 03/29/2011  . Hyperlipidemia 03/29/2011  . BPH (benign prostatic hypertrophy) 03/29/2011  . GERD (gastroesophageal reflux disease) 03/29/2011  . S/P cholecystectomy 03/29/2011  . 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 Allergic rhinitis, cause unspecified 03/29/2011  . Anemia 05/2009    blood loss anemia, transfused 6 units.   . Arthritis   . Urine incontinence   . Low back pain   . DJD (degenerative joint disease), lumbar 04/01/2011  . Prostate cancer 03/29/2011  . History of nuclear stress test 03/27/2010    exercise; no ischemic changes, normal study   . Colon polyp 05/2012    cecal, ascending tubular adenomas.     Past Surgical History  Procedure Laterality Date  . Coronary angioplasty with stent placement  04/24/2009    2 Xience DES to LAD 3.5x35mm and 3.0x83mm overlapping  . Tonsillectomy  1951  . Cholecystectomy  1986  . Partial gastrectomy  10/2009    GIST  . Transthoracic  echocardiogram  10/09/2009    EF 55-60%; normaml LV systolic function with grade 1 diastolic dysfunction  . Gastric artery emoblization Left 05/2009    There were no vitals filed for this visit.  Visit Diagnosis:  Abnormal posture  Right-sided low back pain without sciatica  Muscle spasm of back  Thoracic scoliosis      Subjective Assessment - 05/29/15 1428    Subjective Back about the same.  Some days it does not bother me and at other tines I notice it.  It gets worse in the afternoons when I am more busy.  I wish I could say it was different.                          [place] Adult PT Treatment/Exercise - 05/29/15 1437    High Level Balance   High Level Balance Comments ststic and dynamic stand narrowed base and wide moving to narrowed base.   Self-Care   Self-Care --  suggested lace up supportive shoes with arches.   Lumbar Exercises: Stretches   Passive Hamstring Stretch 3 reps;30 seconds   Single Knee to Chest Stretch 3 reps;20 seconds   Lower Trunk Rotation 10 seconds;30 seconds   Lumbar Exercises: Standing   Other Standing Lumbar Exercises hip SLR  abduction 10  X ,    Lumbar Exercises: Supine   Other Supine Lumbar Exercises leg lengthener  PT Short Term Goals - 05/29/15 1519    PT SHORT TERM GOAL #1   Title PT will be I with basic HEP (05/24/2015)   Baseline indepennt.   Time 3   Period Weeks   Status Achieved   PT SHORT TERM GOAL #2   Title Pt will be able to verbalize and demonstrate techniques to reduce low back pain/reinjury via postural awareness, lifting and carrying mechanics, and HEP (05/24/2015)   Time 3   Period Weeks   Status On-going           PT Long Term Goals - 05/29/15 1519    PT LONG TERM GOAL #1   Title upon discharge pt will be I with all HEp given throughout therapy (06/14/2015)   Time 6   Period Weeks   Status On-going   PT LONG TERM GOAL #2   Title pt will increase lumbar mobility by > 10  degrees in all planes to help with ADLS (0/05/6760)   Baseline continues to stretch   Time 6   Period Weeks   Status On-going   PT LONG TERM GOAL #3   Title pt will be able to tolerate standing/walking for > 30-45 minutes with < 2/10 Pain to increase endurace with personal goal of being able to cook and walk more (06/14/2015)   Baseline says he does not have a good place to walk traffic is an issue.   Time 6   Period Weeks   Status On-going   PT LONG TERM GOAL #4   Title pt will be able to perform lifting and carrying activities of > 10# while maintaining good posture to help with ADLs (06/14/2015)   Time 6   Period Weeks   Status Unable to assess   PT LONG TERM GOAL #5   Title pt will increase FOTO score to > 53 to indicated improvment of funcitonal capacity upon discharge (06/14/2015)   Time 6   Period Weeks   Status Unable to assess               Plan - 05/29/15 1518    Clinical Impression Statement Patient fell since last visit with narrowed base of support getting up from table.  Some balance and strengthening work focus today,  No pain increase,   PT Next Visit Plan Balance with hip strengthening.    Consulted and Agree with Plan of Care Patient        Problem List Patient Active Problem List   Diagnosis Date Noted  . Thoracic scoliosis 03/30/2015  . Facet syndrome, lumbar 03/30/2015  . Myofascial pain on right side 03/30/2015  . Mild dementia 03/14/2015  . Abnormal LFTs   . Chest pain 03/10/2015  . Left shoulder pain 02/01/2015  . Pleuritic chest pain 02/01/2015  . Abnormal CXR 12/16/2014  . Chronic back pain 12/16/2014  . Unsteady gait 12/02/2014  . Memory loss 07/19/2013  . Loss of weight 07/16/2013  . Chest pain with low risk for cardiac etiology 07/12/2013  . Protein-calorie malnutrition, severe 07/12/2013  . Subacute confusional state 05/28/2013  . Gait disorder 05/28/2013  . Subarachnoid hemorrhage following injury 04/16/2013  . Shoulder  dislocation-right 04/16/2013  . Intermittent low back pain 04/15/2012  . Constipation 03/04/2012  . Dysuria 03/04/2012  . Pneumonia 01/29/2012  . Dizziness 12/18/2011  . Balance disorder 12/18/2011  . Pre-ulcerative corn or callous 10/13/2011  . DJD (degenerative joint disease), lumbar 04/01/2011  . CAD (coronary artery disease) 03/29/2011  . Prostate cancer  03/29/2011  . 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 HTN (hypertension) 03/29/2011  . Hyperlipidemia 03/29/2011  . BPH (benign prostatic hypertrophy) 03/29/2011  . GERD (gastroesophageal reflux disease) 03/29/2011  . Preventative health care 03/29/2011  . S/P cholecystectomy 03/29/2011  . Allergic rhinitis 03/29/2011  . S/P partial gastrectomy 03/29/2011  . Anemia 03/29/2011    HARRIS,KAREN 05/29/2015, 3:21 PM  [facility] [address] [place], [place], [address] Phone: [phone]   Fax:  [phone]     Melvenia Needles, PTA 05/29/2015 3:21 PM Phone: [phone] Fax: [phone]

## 2015-05-31 ENCOUNTER — Ambulatory Visit: Payer: Medicare Other | Admitting: Physical Therapy

## 2015-05-31 DIAGNOSIS — M436 Torticollis: Secondary | ICD-10-CM

## 2015-05-31 DIAGNOSIS — M545 Low back pain, unspecified: Secondary | ICD-10-CM

## 2015-05-31 DIAGNOSIS — M419 Scoliosis, unspecified: Secondary | ICD-10-CM

## 2015-05-31 DIAGNOSIS — R293 Abnormal posture: Secondary | ICD-10-CM

## 2015-05-31 DIAGNOSIS — M62830 Muscle spasm of back: Secondary | ICD-10-CM

## 2015-05-31 NOTE — Patient Instructions (Signed)
  Perform this stretch while sitting on the couch with a pillow beside the right hip and leaning over the pillow with the Left arm overhead (you do not need to grab toes)    Starr Lake PT, DPT, LAT, ATC  [facility] Phone: [phone]

## 2015-05-31 NOTE — Therapy (Signed)
[place] [place], [place], [address] Phone: [phone]   Fax:  [phone]  Physical Therapy Treatment  Patient Details  Name: Eric Cooley MRN: [id number] Date of Birth: 01-Dec-1919 Referring Provider:  Biagio Borg, MD  Encounter Date: 05/31/2015      PT End of Session - 05/31/15 1430    Visit Number 6   Number of Visits 12   PT Start Time 1330   PT Stop Time 1415   PT Time Calculation (min) 45 min   Activity Tolerance Patient tolerated treatment well   Behavior During Therapy [facility] for tasks assessed/performed      Past Medical History  Diagnosis Date  . Hypertension   . MI (myocardial infarction)   . Gastrointestinal stromal tumor (GIST) of stomach 05/2009, 10/2009    2010: gastric ulcer with GIST: IR embolization left gastric artery.  2011: partial gastrectomy  . CAD (coronary artery disease) 05/2012  . HTN (hypertension) 03/29/2011  . Hyperlipidemia 03/29/2011  . BPH (benign prostatic hypertrophy) 03/29/2011  . GERD (gastroesophageal reflux disease) 03/29/2011  . S/P cholecystectomy 03/29/2011  . 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 Allergic rhinitis, cause unspecified 03/29/2011  . Anemia 05/2009    blood loss anemia, transfused 6 units.   . Arthritis   . Urine incontinence   . Low back pain   . DJD (degenerative joint disease), lumbar 04/01/2011  . Prostate cancer 03/29/2011  . History of nuclear stress test 03/27/2010    exercise; no ischemic changes, normal study   . Colon polyp 05/2012    cecal, ascending tubular adenomas.     Past Surgical History  Procedure Laterality Date  . Coronary angioplasty with stent placement  04/24/2009    2 Xience DES to LAD 3.5x55mm and 3.0x69mm overlapping  . Tonsillectomy  1951  . Cholecystectomy  1986  . Partial gastrectomy  10/2009    GIST  . Transthoracic echocardiogram  10/09/2009    EF 55-60%; normaml LV systolic function  with grade 1 diastolic dysfunction  . Gastric artery emoblization Left 05/2009    There were no vitals filed for this visit.  Visit Diagnosis:  Abnormal posture  Right-sided low back pain without sciatica  Muscle spasm of back  Thoracic scoliosis  Stiffness of cervical spine      Subjective Assessment - 05/31/15 1328    Subjective "some days are good and some days are worse, it depends on what I am doing and when I wake up",    Currently in Pain? Yes   Pain Score 2    Pain Location Back   Pain Orientation Mid;Right;Lower   Pain Descriptors / Indicators Aching   Pain Type Chronic pain   Pain Onset More than a month ago   Pain Frequency Intermittent   Aggravating Factors  prolonged standing/walking, waking up in the mornings   Pain Relieving Factors resting                         [facility] Adult PT Treatment/Exercise - 05/31/15 1336    Lumbar Exercises: Stretches   Passive Hamstring Stretch 3 reps;30 seconds   Single Knee to Chest Stretch 3 reps;20 seconds   Lower Trunk Rotation 10 seconds;30 seconds   Standing Side Bend 3 reps;30 seconds  L QL stretch seated leaning over bolster    Lumbar Exercises: Aerobic   Stationary Bike L6 x 8 min   Lumbar Exercises: Standing  Other Standing Lumbar Exercises hip extension/abduction 2 x 10   HHA onto counter   Lumbar Exercises: Seated   Sit to Stand 5 reps  VC to sit down when he can feel the table against legs   Lumbar Exercises: Supine   Clam 10 reps   Bridge 10 reps   Other Supine Lumbar Exercises leg lengthener, with leg press on the R and hip distraction on the L                PT Education - 05/31/15 1430    Education provided Yes   Education Details updated HEP, answered questions regarding scoliosis   Person(s) Educated Patient   Methods Explanation   Comprehension Verbalized understanding          PT Short Term Goals - 05/29/15 1519    PT SHORT TERM GOAL #1   Title PT will be I with  basic HEP (05/24/2015)   Baseline indepennt.   Time 3   Period Weeks   Status Achieved   PT SHORT TERM GOAL #2   Title Pt will be able to verbalize and demonstrate techniques to reduce low back pain/reinjury via postural awareness, lifting and carrying mechanics, and HEP (05/24/2015)   Time 3   Period Weeks   Status On-going           PT Long Term Goals - 05/29/15 1519    PT LONG TERM GOAL #1   Title upon discharge pt will be I with all HEp given throughout therapy (06/14/2015)   Time 6   Period Weeks   Status On-going   PT LONG TERM GOAL #2   Title pt will increase lumbar mobility by > 10 degrees in all planes to help with ADLS (11/10/2681)   Baseline continues to stretch   Time 6   Period Weeks   Status On-going   PT LONG TERM GOAL #3   Title pt will be able to tolerate standing/walking for > 30-45 minutes with < 2/10 Pain to increase endurace with personal goal of being able to cook and walk more (06/14/2015)   Baseline says he does not have a good place to walk traffic is an issue.   Time 6   Period Weeks   Status On-going   PT LONG TERM GOAL #4   Title pt will be able to perform lifting and carrying activities of > 10# while maintaining good posture to help with ADLs (06/14/2015)   Time 6   Period Weeks   Status Unable to assess   PT LONG TERM GOAL #5   Title pt will increase FOTO score to > 53 to indicated improvment of funcitonal capacity upon discharge (06/14/2015)   Time 6   Period Weeks   Status Unable to assess               Plan - 05/31/15 1431    Clinical Impression Statement Aarion continues to report intermittent pain inthe low back with the R>L. He was able to finish all exercises with report of some tenderness in the low back, during manual. He was able to maintain rhomberg balacne with eyes open/ closed but demonstrated difficulty during tandem balacne with moderate postural sway requiring CGA/min A  to maintain balance. Updated HEP to include QL  stretches and tandem balance in corner holding onto a chair.    PT Next Visit Plan balance training, QL stretch, hip strengthening    PT Home Exercise Plan QL stretches and tandem balance in  corner holding onto a chair   Consulted and Agree with Plan of Care Patient        Problem List Patient Active Problem List   Diagnosis Date Noted  . Thoracic scoliosis 03/30/2015  . Facet syndrome, lumbar 03/30/2015  . Myofascial pain on right side 03/30/2015  . Mild dementia 03/14/2015  . Abnormal LFTs   . Chest pain 03/10/2015  . Left shoulder pain 02/01/2015  . Pleuritic chest pain 02/01/2015  . Abnormal CXR 12/16/2014  . Chronic back pain 12/16/2014  . Unsteady gait 12/02/2014  . Memory loss 07/19/2013  . Loss of weight 07/16/2013  . Chest pain with low risk for cardiac etiology 07/12/2013  . Protein-calorie malnutrition, severe 07/12/2013  . Subacute confusional state 05/28/2013  . Gait disorder 05/28/2013  . Subarachnoid hemorrhage following injury 04/16/2013  . Shoulder dislocation-right 04/16/2013  . Intermittent low back pain 04/15/2012  . Constipation 03/04/2012  . Dysuria 03/04/2012  . Pneumonia 01/29/2012  . Dizziness 12/18/2011  . Balance disorder 12/18/2011  . Pre-ulcerative corn or callous 10/13/2011  . DJD (degenerative joint disease), lumbar 04/01/2011  . CAD (coronary artery disease) 03/29/2011  . Prostate cancer 03/29/2011  . 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 HTN (hypertension) 03/29/2011  . Hyperlipidemia 03/29/2011  . BPH (benign prostatic hypertrophy) 03/29/2011  . GERD (gastroesophageal reflux disease) 03/29/2011  . Preventative health care 03/29/2011  . S/P cholecystectomy 03/29/2011  . Allergic rhinitis 03/29/2011  . S/P partial gastrectomy 03/29/2011  . Anemia 03/29/2011   Starr Lake PT, DPT, LAT, ATC  05/31/2015  2:39 PM      [facility] [address] [place], [place],  [address] Phone: [phone]   Fax:  [phone]

## 2015-06-05 ENCOUNTER — Ambulatory Visit: Payer: Medicare Other | Admitting: Physical Therapy

## 2015-06-05 DIAGNOSIS — M545 Low back pain, unspecified: Secondary | ICD-10-CM

## 2015-06-05 DIAGNOSIS — M419 Scoliosis, unspecified: Secondary | ICD-10-CM

## 2015-06-05 DIAGNOSIS — M62830 Muscle spasm of back: Secondary | ICD-10-CM

## 2015-06-05 DIAGNOSIS — R293 Abnormal posture: Secondary | ICD-10-CM

## 2015-06-05 NOTE — Therapy (Signed)
[place], [place], [address] Phone: [phone]   Fax:  [phone]  Physical Therapy Treatment  Patient Details  Name: Eric Cooley MRN: [id number] Date of Birth: 1920-06-27 Referring Provider:  Biagio Borg, MD  Encounter Date: 06/05/2015      PT End of Session - 06/05/15 1543    Visit Number 7   Number of Visits 12   Date for PT Re-Evaluation 06/14/15   PT Start Time 7371   PT Stop Time 1500   PT Time Calculation (min) 45 min   Activity Tolerance Patient tolerated treatment well;No increased pain   Behavior During Therapy [facility] for tasks assessed/performed      Past Medical History  Diagnosis Date  . Hypertension   . MI (myocardial infarction)   . Gastrointestinal stromal tumor (GIST) of stomach 05/2009, 10/2009    2010: gastric ulcer with GIST: IR embolization left gastric artery.  2011: partial gastrectomy  . CAD (coronary artery disease) 05/2012  . HTN (hypertension) 03/29/2011  . Hyperlipidemia 03/29/2011  . BPH (benign prostatic hypertrophy) 03/29/2011  . GERD (gastroesophageal reflux disease) 03/29/2011  . S/P cholecystectomy 03/29/2011  . 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 Allergic rhinitis, cause unspecified 03/29/2011  . Anemia 05/2009    blood loss anemia, transfused 6 units.   . Arthritis   . Urine incontinence   . Low back pain   . DJD (degenerative joint disease), lumbar 04/01/2011  . Prostate cancer 03/29/2011  . History of nuclear stress test 03/27/2010    exercise; no ischemic changes, normal study   . Colon polyp 05/2012    cecal, ascending tubular adenomas.     Past Surgical History  Procedure Laterality Date  . Coronary angioplasty with stent placement  04/24/2009    2 Xience DES to LAD 3.5x84mm and 3.0x74mm overlapping  . Tonsillectomy  1951  . Cholecystectomy  1986  . Partial gastrectomy  10/2009    GIST  . Transthoracic  echocardiogram  10/09/2009    EF 55-60%; normaml LV systolic function with grade 1 diastolic dysfunction  . Gastric artery emoblization Left 05/2009    There were no vitals filed for this visit.  Visit Diagnosis:  Abnormal posture  Right-sided low back pain without sciatica  Muscle spasm of back  Thoracic scoliosis      Subjective Assessment - 06/05/15 1446    Subjective 5/10.  Rt flank.  May be from exercises.  Has some pain riding to [place] over the weekend  Dull eased with standing   Currently in Pain? Yes   Pain Score 5    Pain Location Back   Pain Orientation Right;Lower;Posterior   Pain Descriptors / Indicators Dull   Pain Radiating Towards flank   Pain Frequency Intermittent   Aggravating Factors  riding in car.     Pain Relieving Factors resting                         [facility] Adult PT Treatment/Exercise - 06/05/15 1415    Ambulation/Gait   Pre-Gait Activities weight shifting.  out and in parallel bars.     Gait Comments parallel bars ,,  cues for step length and rotation. to assist balance   High Level Balance   High Level Balance Comments static in corner, whole hands to fingers.  chair helpful.  also counter with belt dynamic, wide to narrowed base.  carry ball.   Lumbar  Exercises: Stretches   Passive Hamstring Stretch 3 reps;30 seconds   Lower Trunk Rotation 5 reps   Quad Stretch Limitations quadratus lumborum stretch over bolster needed cues.     Lumbar Exercises: Supine   Clam 10 reps   Bridge 20 reps   Straight Leg Raises Limitations 10 reps with ball squeeze   Lumbar Exercises: Sidelying   Clam 10 reps  each side   Hip Abduction 10 reps  RT side only                  PT Short Term Goals - 06/05/15 1558    PT SHORT TERM GOAL #1   Title PT will be I with basic HEP (05/24/2015)   Time 3   Period Weeks   Status Achieved   PT SHORT TERM GOAL #2   Title Pt will be able to verbalize and demonstrate techniques to reduce low  back pain/reinjury via postural awareness, lifting and carrying mechanics, and HEP (05/24/2015)   Baseline simulated carrying today during balance.  Correct technique observed.   Time 3   Period Weeks   Status On-going           PT Long Term Goals - 06/05/15 1559    PT LONG TERM GOAL #1   Title upon discharge pt will be I with all HEp given throughout therapy (06/14/2015)   Baseline cues needed   Time 6   Period Weeks   Status On-going   PT LONG TERM GOAL #2   Title pt will increase lumbar mobility by > 10 degrees in all planes to help with ADLS (01/06/7061)   Time 6   Period Weeks   Status On-going   PT LONG TERM GOAL #3   Title pt will be able to tolerate standing/walking for > 30-45 minutes with < 2/10 Pain to increase endurace with personal goal of being able to cook and walk more (06/14/2015)   Time 6   Period Weeks   Status On-going   PT LONG TERM GOAL #4   Title pt will be able to perform lifting and carrying activities of > 10# while maintaining good posture to help with ADLs (06/14/2015)   Baseline carries a ball   Time 6   Period Weeks   Status On-going   PT LONG TERM GOAL #5   Title pt will increase FOTO score to > 53 to indicated improvment of funcitonal capacity upon discharge (06/14/2015)   Time 6   Period Weeks   Status Unable to assess               Plan - 06/05/15 1543    Clinical Impression Statement pain today for the first time.  zpstient is not sure of the cause.  He declined moist heat.  Cues needed with new home exercises.  Progress toward home exercise and balance goals.   PT Next Visit Plan BERG, MD note.   Consulted and Agree with Plan of Care Patient        Problem List Patient Active Problem List   Diagnosis Date Noted  . Thoracic scoliosis 03/30/2015  . Facet syndrome, lumbar 03/30/2015  . Myofascial pain on right side 03/30/2015  . Mild dementia 03/14/2015  . Abnormal LFTs   . Chest pain 03/10/2015  . Left shoulder pain 02/01/2015   . Pleuritic chest pain 02/01/2015  . Abnormal CXR 12/16/2014  . Chronic back pain 12/16/2014  . Unsteady gait 12/02/2014  . Memory loss 07/19/2013  . Loss  of weight 07/16/2013  . Chest pain with low risk for cardiac etiology 07/12/2013  . Protein-calorie malnutrition, severe 07/12/2013  . Subacute confusional state 05/28/2013  . Gait disorder 05/28/2013  . Subarachnoid hemorrhage following injury 04/16/2013  . Shoulder dislocation-right 04/16/2013  . Intermittent low back pain 04/15/2012  . Constipation 03/04/2012  . Dysuria 03/04/2012  . Pneumonia 01/29/2012  . Dizziness 12/18/2011  . Balance disorder 12/18/2011  . Pre-ulcerative corn or callous 10/13/2011  . DJD (degenerative joint disease), lumbar 04/01/2011  . CAD (coronary artery disease) 03/29/2011  . Prostate cancer 03/29/2011  . 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 HTN (hypertension) 03/29/2011  . Hyperlipidemia 03/29/2011  . BPH (benign prostatic hypertrophy) 03/29/2011  . GERD (gastroesophageal reflux disease) 03/29/2011  . Preventative health care 03/29/2011  . S/P cholecystectomy 03/29/2011  . Allergic rhinitis 03/29/2011  . S/P partial gastrectomy 03/29/2011  . Anemia 03/29/2011    Jamilya Sarrazin 06/05/2015, 4:01 PM  [facility] [address] [place], [place], [address] Phone: [phone]   Fax:  [phone]     Melvenia Needles, PTA 06/05/2015 4:01 PM Phone: [phone] Fax: [phone]

## 2015-06-07 ENCOUNTER — Ambulatory Visit: Payer: Medicare Other | Admitting: Physical Therapy

## 2015-06-07 DIAGNOSIS — M545 Low back pain, unspecified: Secondary | ICD-10-CM

## 2015-06-07 DIAGNOSIS — R293 Abnormal posture: Secondary | ICD-10-CM

## 2015-06-07 DIAGNOSIS — M62830 Muscle spasm of back: Secondary | ICD-10-CM

## 2015-06-07 NOTE — Therapy (Signed)
[facility] [address] [place], [place], [address] Phone: [phone]   Fax:  [phone]  Physical Therapy Treatment  Patient Details  Name: Eric Cooley MRN: [id number] Date of Birth: [birth year] Referring Provider:  Corwin Levins, MD  Encounter Date: 06/07/2015      PT End of Session - 06/07/15 1740    Visit Number 8   Number of Visits 12   Date for PT Re-Evaluation 06/14/15   PT Start Time 1420   PT Stop Time 1505   PT Time Calculation (min) 45 min   Activity Tolerance Patient tolerated treatment well;No increased pain   Behavior During Therapy [facility] for tasks assessed/performed      Past Medical History  Diagnosis Date  . Hypertension   . MI (myocardial infarction)   . Gastrointestinal stromal tumor (GIST) of stomach 05/2009, 10/2009    2010: gastric ulcer with GIST: IR embolization left gastric artery.  2011: partial gastrectomy  . CAD (coronary artery disease) 05/2012  . HTN (hypertension) 03/29/2011  . Hyperlipidemia 03/29/2011  . BPH (benign prostatic hypertrophy) 03/29/2011  . GERD (gastroesophageal reflux disease) 03/29/2011  . S/P cholecystectomy 03/29/2011  . 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 Allergic rhinitis, cause unspecified 03/29/2011  . Anemia 05/2009    blood loss anemia, transfused 6 units.   . Arthritis   . Urine incontinence   . Low back pain   . DJD (degenerative joint disease), lumbar 04/01/2011  . Prostate cancer 03/29/2011  . History of nuclear stress test 03/27/2010    exercise; no ischemic changes, normal study   . Colon polyp 05/2012    cecal, ascending tubular adenomas.     Past Surgical History  Procedure Laterality Date  . Coronary angioplasty with stent placement  04/24/2009    2 Xience DES to LAD 3.5x104mm and 3.0x70mm overlapping  . Tonsillectomy  1951  . Cholecystectomy  1986  . Partial gastrectomy  10/2009    GIST  . Transthoracic  echocardiogram  10/09/2009    EF 55-60%; normaml LV systolic function with grade 1 diastolic dysfunction  . Gastric artery emoblization Left 05/2009    There were no vitals filed for this visit.  Visit Diagnosis:  Abnormal posture  Right-sided low back pain without sciatica  Muscle spasm of back      Subjective Assessment - 06/07/15 1741    Subjective some pain today mild.  He did not giv a number sayinf it was less than 5/10 RT flank area.  worse with activities.  Walking and mowing the grass with a push mower help make his back feel better.  New shoes help his balance.     Currently in Pain? Yes   Pain Score --  mild   Pain Location Back   Pain Orientation Right   Pain Descriptors / Indicators Dull   Pain Type Chronic pain   Pain Frequency Intermittent   Aggravating Factors  doing too much   Pain Relieving Factors walking mowing the grass.   Multiple Pain Sites No                         [facility] Adult PT Treatment/Exercise - 06/07/15 1420    High Level Balance   High Level Balance Comments dymamic and static balance wide and narrowed base.  Tandem holds 4  to 7 seconds .  Possible improved with new supportive shoes.     Self-Care  Self-Care --  lifting practiced reviev discouraged forward lean unsupporte   Lumbar Exercises: Aerobic   Stationary Bike L6 8 minutes   Lumbar Exercises: Standing   Functional Squats 10 reps  needed cues   Lifting Other (comment)   Lifting Weights (lbs) bolster lift from mat height.  Practiced carry technique and picking objects off floor.  He has the posture handout and admits he does some things with forward trunk lean unsupported.                PT Education - 06/07/15 1739    Education provided Yes   Education Details lifting techniques   Person(s) Educated Patient   Methods Explanation;Demonstration;Tactile cues;Verbal cues   Comprehension Verbalized understanding;Returned demonstration          PT Short  Term Goals - 06/07/15 1745    PT SHORT TERM GOAL #1   Title PT will be I with basic HEP (05/24/2015)   Baseline independent   Time 3   Period Weeks   Status Achieved   PT SHORT TERM GOAL #2   Title Pt will be able to verbalize and demonstrate techniques to reduce low back pain/reinjury via postural awareness, lifting and carrying mechanics, and HEP (05/24/2015)   Baseline practiced today correct techniques  used   Time 3   Period Weeks   Status Achieved           PT Long Term Goals - 06/07/15 1746    PT LONG TERM GOAL #1   Title upon discharge pt will be I with all HEp given throughout therapy (06/14/2015)   Baseline cues needed   Time 6   Period Weeks   Status On-going   PT LONG TERM GOAL #2   Title pt will increase lumbar mobility by > 10 degrees in all planes to help with ADLS (06/08/1193)   Baseline continues to stretch   Time 6   Period Weeks   Status On-going   PT LONG TERM GOAL #3   Title pt will be able to tolerate standing/walking for > 30-45 minutes with < 2/10 Pain to increase endurace with personal goal of being able to cook and walk more (06/14/2015)   Baseline 15 minutes tolerated. pain inconstant.  sometimes no pain at all.   Time 6   Period Weeks   Status Partially Met   PT LONG TERM GOAL #4   Title pt will be able to perform lifting and carrying activities of > 10# while maintaining good posture to help with ADLs (06/14/2015)   Baseline light objects in clinic, patient says he can carry laundry basket at home   Time 6   Period Weeks   Status Partially Met   PT LONG TERM GOAL #5   Title pt will increase FOTO score to > 53 to indicated improvment of funcitonal capacity upon discharge (06/14/2015)   Time 6   Status Unable to assess               Plan - 06/07/15 1740    Clinical Impression Statement Patient has pain today  less than 5/10.  No pain increase with today's session.  STG#2 met. LTG#3, #4 partially met.   PT Next Visit Plan See what md says .   Try to cayy/lift 10 LBS in clinic.       Consulted and Agree with Plan of Care Patient        Problem List Patient Active Problem List   Diagnosis Date Noted  .  Thoracic scoliosis 03/30/2015  . Facet syndrome, lumbar 03/30/2015  . Myofascial pain on right side 03/30/2015  . Mild dementia 03/14/2015  . Abnormal LFTs   . Chest pain 03/10/2015  . Left shoulder pain 02/01/2015  . Pleuritic chest pain 02/01/2015  . Abnormal CXR 12/16/2014  . Chronic back pain 12/16/2014  . Unsteady gait 12/02/2014  . Memory loss 07/19/2013  . Loss of weight 07/16/2013  . Chest pain with low risk for cardiac etiology 07/12/2013  . Protein-calorie malnutrition, severe 07/12/2013  . Subacute confusional state 05/28/2013  . Gait disorder 05/28/2013  . Subarachnoid hemorrhage following injury 04/16/2013  . Shoulder dislocation-right 04/16/2013  . Intermittent low back pain 04/15/2012  . Constipation 03/04/2012  . Dysuria 03/04/2012  . Pneumonia 01/29/2012  . Dizziness 12/18/2011  . Balance disorder 12/18/2011  . Pre-ulcerative corn or callous 10/13/2011  . DJD (degenerative joint disease), lumbar 04/01/2011  . CAD (coronary artery disease) 03/29/2011  . Prostate cancer 03/29/2011  . 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 HTN (hypertension) 03/29/2011  . Hyperlipidemia 03/29/2011  . BPH (benign prostatic hypertrophy) 03/29/2011  . GERD (gastroesophageal reflux disease) 03/29/2011  . Preventative health care 03/29/2011  . S/P cholecystectomy 03/29/2011  . Allergic rhinitis 03/29/2011  . S/P partial gastrectomy 03/29/2011  . Anemia 03/29/2011    HARRIS,KAREN 06/07/2015, 5:52 PM  [facility] [address] [place], [place], [address] Phone: [phone]   Fax:  [phone]     Melvenia Needles, PTA 06/07/2015 5:52 PM Phone: [phone] Fax: [phone]

## 2015-06-08 ENCOUNTER — Ambulatory Visit (HOSPITAL_BASED_OUTPATIENT_CLINIC_OR_DEPARTMENT_OTHER): Payer: Medicare Other | Admitting: Physical Medicine & Rehabilitation

## 2015-06-08 ENCOUNTER — Encounter: Payer: Self-pay | Admitting: Physical Medicine & Rehabilitation

## 2015-06-08 ENCOUNTER — Encounter: Payer: Medicare Other | Attending: Physical Medicine & Rehabilitation

## 2015-06-08 VITALS — BP 132/70 | HR 72 | Resp 15

## 2015-06-08 DIAGNOSIS — G894 Chronic pain syndrome: Secondary | ICD-10-CM | POA: Insufficient documentation

## 2015-06-08 DIAGNOSIS — M47816 Spondylosis without myelopathy or radiculopathy, lumbar region: Secondary | ICD-10-CM | POA: Diagnosis not present

## 2015-06-08 DIAGNOSIS — M791 Myalgia: Secondary | ICD-10-CM | POA: Insufficient documentation

## 2015-06-08 DIAGNOSIS — Z5181 Encounter for therapeutic drug level monitoring: Secondary | ICD-10-CM | POA: Diagnosis not present

## 2015-06-08 DIAGNOSIS — R109 Unspecified abdominal pain: Principal | ICD-10-CM

## 2015-06-08 DIAGNOSIS — M545 Low back pain: Secondary | ICD-10-CM | POA: Insufficient documentation

## 2015-06-08 DIAGNOSIS — R1011 Right upper quadrant pain: Secondary | ICD-10-CM

## 2015-06-08 DIAGNOSIS — M4124 Other idiopathic scoliosis, thoracic region: Secondary | ICD-10-CM

## 2015-06-08 DIAGNOSIS — M419 Scoliosis, unspecified: Secondary | ICD-10-CM

## 2015-06-08 DIAGNOSIS — G8929 Other chronic pain: Secondary | ICD-10-CM | POA: Insufficient documentation

## 2015-06-08 DIAGNOSIS — Z79899 Other long term (current) drug therapy: Secondary | ICD-10-CM | POA: Insufficient documentation

## 2015-06-08 NOTE — Patient Instructions (Signed)
We discussed medial branch blocks which we will do first also we discussed radiofrequency ablation  We also discussed possibility that this could be deep muscle chronic pain

## 2015-06-08 NOTE — Progress Notes (Signed)
Subjective:    Patient ID: Eric Cooley, male    DOB: 09-29-1920, 81 y.o.   MRN: [id number] 81 year old male with 12 year history of low back pain this is mainly right-sided, insidious onset, no history of trauma HPI Patient now working with physical therapy focusing on his back. They're working on balance they working on spinal mobility, also working on Hotel manager. He states that his pain really has not changed much since starting therapy. He remains independent with all self-care and mobility Ambulates community distances Pain Inventory Average Pain 5 Pain Right Now 2 My pain is burning  In the last 24 hours, has pain interfered with the following? General activity 5 Relation with others 2 Enjoyment of life 5 What TIME of day is your pain at its worst? na Sleep (in general) Good  Pain is worse with: walking, bending, standing and some activites Pain improves with: rest, heat/ice and medication Relief from Meds: 2  Mobility walk without assistance ability to climb steps?  yes do you drive?  yes  Function retired  Neuro/Psych No problems in this area  Prior Studies Any changes since last visit?  no  Physicians involved in your care Any changes since last visit?  no   Family History  Problem Relation Age of Onset  . Arthritis Mother   . Ovarian cancer Mother   . Heart failure Mother   . Arthritis Father   . Lung cancer Father   . Colon cancer Neg Hx    Social History   Social History  . Marital Status: Widowed    Spouse Name: N/A  . Number of Children: 3  . Years of Education: 16   Occupational History  . retired Art gallery manager    Social History Main Topics  . Smoking status: Former Smoker    Quit date: 10/07/1960  . Smokeless tobacco: Never Used     Comment: Stopped smoking in 1962  . Alcohol Use: 0.6 oz/week    1 Glasses of wine per week     Comment: very rare  . Drug Use: No  . Sexual Activity: Not Asked   Other Topics Concern    . None   Social History Narrative   Past Surgical History  Procedure Laterality Date  . Coronary angioplasty with stent placement  04/24/2009    2 Xience DES to LAD 3.5x22mm and 3.0x61mm overlapping  . Tonsillectomy  1951  . Cholecystectomy  1986  . Partial gastrectomy  10/2009    GIST  . Transthoracic echocardiogram  10/09/2009    EF 55-60%; normaml LV systolic function with grade 1 diastolic dysfunction  . Gastric artery emoblization Left 05/2009   Past Medical History  Diagnosis Date  . Hypertension   . MI (myocardial infarction)   . Gastrointestinal stromal tumor (GIST) of stomach 05/2009, 10/2009    2010: gastric ulcer with GIST: IR embolization left gastric artery.  2011: partial gastrectomy  . CAD (coronary artery disease) 05/2012  . HTN (hypertension) 03/29/2011  . Hyperlipidemia 03/29/2011  . BPH (benign prostatic hypertrophy) 03/29/2011  . GERD (gastroesophageal reflux disease) 03/29/2011  . S/P cholecystectomy 03/29/2011  . 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 Allergic rhinitis, cause unspecified 03/29/2011  . Anemia 05/2009    blood loss anemia, transfused 6 units.   . Arthritis   . Urine incontinence   . Low back pain   . DJD (degenerative joint disease), lumbar 04/01/2011  . Prostate cancer 03/29/2011  . History of nuclear  stress test 03/27/2010    exercise; no ischemic changes, normal study   . Colon polyp 05/2012    cecal, ascending tubular adenomas.    BP 132/70 mmHg  Pulse 72  Resp 15  SpO2 96%  Opioid Risk Score:   Fall Risk Score:  `1  Depression screen PHQ 2/9  Depression screen [facility] 2/9 06/08/2015 03/30/2015  Decreased Interest 0 0  Down, Depressed, Hopeless 0 0  PHQ - 2 Score 0 0  Altered sleeping - 0  Tired, decreased energy - 0  Change in appetite - 0  Feeling bad or failure about yourself  - 0  Trouble concentrating - 0  Moving slowly or fidgety/restless - 0  Suicidal thoughts - 0  PHQ-9 Score - 0      Review  of Systems  All other systems reviewed and are negative.      Objective:   Physical Exam  Constitutional: He is oriented to person, place, and time. He appears well-developed and well-nourished.  HENT:  Head: Normocephalic and atraumatic.  hard of hearing  Eyes: Conjunctivae and EOM are normal. Pupils are equal, round, and reactive to light.  Neck: Normal range of motion. Neck supple.  Musculoskeletal:       Right hip: Normal.       Left hip: Normal.       Lumbar back: He exhibits decreased range of motion, tenderness and pain. He exhibits no deformity.  Neurological: He is alert and oriented to person, place, and time. He has normal strength. No sensory deficit. Gait abnormal.  Reflex Scores:      Tricep reflexes are 2+ on the right side and 2+ on the left side.      Bicep reflexes are 2+ on the right side and 2+ on the left side.      Brachioradialis reflexes are 2+ on the right side and 2+ on the left side.      Patellar reflexes are 2+ on the right side and 2+ on the left side.      Achilles reflexes are 2+ on the right side and 2+ on the left side. Senile gait no evidence of toe drag or knee instability  Psychiatric: He has a normal mood and affect.  Nursing note and vitals reviewed.  Mild tenderness to palpation along the posterior subcostal area inferior to T12 and approximately 4 cm lateral to the T12 spinous process       Assessment & Plan:  1. Chronic low back pain aggravated by fall. He has evidence of lumbar spondylosis multilevel reviewed on MRI look at the actual films as well as the report. Clinically it appears to be upper to mid lumbar area. There is no sign of neurogenic claudication or radiculopathy.  Recommend medial branch blocks right L2 L3 L4 We'll finish up physical therapy, encourage strengthening for lower extremity at least to help with balance. Discussed my recommendations and findings with the patient as well as his daughter We discussed the risks  and benefits of the procedure gave educational handouts.  .  Will not need any further imaging studies at the current time.  2. Right flank pain appears to be muscular perhaps quadratus lumborum.  This could be secondary to the underlying spondylosis with compensatory gait alteration. We'll reassess after medial branch blocks. Consider deep trigger point injection Under ultrasound guidance  Over half of the 25 min visit was spent counseling and coordinating care

## 2015-06-14 ENCOUNTER — Ambulatory Visit: Payer: Medicare Other | Attending: Physical Medicine & Rehabilitation | Admitting: Physical Therapy

## 2015-06-14 DIAGNOSIS — M419 Scoliosis, unspecified: Secondary | ICD-10-CM

## 2015-06-14 DIAGNOSIS — M62830 Muscle spasm of back: Secondary | ICD-10-CM | POA: Diagnosis present

## 2015-06-14 DIAGNOSIS — M545 Low back pain, unspecified: Secondary | ICD-10-CM

## 2015-06-14 DIAGNOSIS — M4124 Other idiopathic scoliosis, thoracic region: Secondary | ICD-10-CM | POA: Insufficient documentation

## 2015-06-14 DIAGNOSIS — R293 Abnormal posture: Secondary | ICD-10-CM | POA: Insufficient documentation

## 2015-06-14 DIAGNOSIS — M436 Torticollis: Secondary | ICD-10-CM | POA: Diagnosis present

## 2015-06-14 NOTE — Therapy (Signed)
[place], [place], [address] Phone: [phone]   Fax:  [phone]  Physical Therapy Treatment  Patient Details  Name: Eric Cooley MRN: [id number] Date of Birth: [birth year] Referring Provider:  Biagio Borg, MD  Encounter Date: 06/14/2015      PT End of Session - 06/14/15 1812    Visit Number 9   Number of Visits 12   Date for PT Re-Evaluation 06/14/15   PT Start Time 6962   PT Stop Time 1500   PT Time Calculation (min) 45 min   Activity Tolerance Patient tolerated treatment well   Behavior During Therapy [facility] for tasks assessed/performed      Past Medical History  Diagnosis Date  . Hypertension   . MI (myocardial infarction)   . Gastrointestinal stromal tumor (GIST) of stomach 05/2009, 10/2009    2010: gastric ulcer with GIST: IR embolization left gastric artery.  2011: partial gastrectomy  . CAD (coronary artery disease) 05/2012  . HTN (hypertension) 03/29/2011  . Hyperlipidemia 03/29/2011  . BPH (benign prostatic hypertrophy) 03/29/2011  . GERD (gastroesophageal reflux disease) 03/29/2011  . S/P cholecystectomy 03/29/2011  . 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 Allergic rhinitis, cause unspecified 03/29/2011  . Anemia 05/2009    blood loss anemia, transfused 6 units.   . Arthritis   . Urine incontinence   . Low back pain   . DJD (degenerative joint disease), lumbar 04/01/2011  . Prostate cancer 03/29/2011  . History of nuclear stress test 03/27/2010    exercise; no ischemic changes, normal study   . Colon polyp 05/2012    cecal, ascending tubular adenomas.     Past Surgical History  Procedure Laterality Date  . Coronary angioplasty with stent placement  04/24/2009    2 Xience DES to LAD 3.5x63mm and 3.0x14mm overlapping  . Tonsillectomy  1951  . Cholecystectomy  1986  . Partial gastrectomy  10/2009    GIST  . Transthoracic echocardiogram  10/09/2009    EF  55-60%; normaml LV systolic function with grade 1 diastolic dysfunction  . Gastric artery emoblization Left 05/2009    There were no vitals filed for this visit.  Visit Diagnosis:  Abnormal posture  Right-sided low back pain without sciatica  Muscle spasm of back  Thoracic scoliosis  Stiffness of cervical spine      Subjective Assessment - 06/14/15 1423    Subjective "I saw my doctor and they think I should get an injection for the pain" pt reports that he is doing ok today and is feeling as normal as possible   Currently in Pain? No/denies   Pain Score 0-No pain  only pain in the morning   Aggravating Factors  doing more at home            [facility] PT Assessment - 06/14/15 1442    Observation/Other Assessments   Focus on Therapeutic Outcomes (FOTO)  57% limited   AROM   Lumbar Flexion 51  pain going up/down   Lumbar Extension 16  pain during movement   Lumbar - Right Side Bend 26   Lumbar - Left Side Bend 26   Lumbar - Right Rotation 25%   Lumbar - Left Rotation 25%   Strength   Right Hip Flexion 4+/5   Right Hip ABduction 4+/5   Right Hip ADduction 5/5   Left Hip Flexion 4+/5   Left Hip ABduction 4/5   Left Hip ADduction 5/5  Right Knee Flexion 5/5   Right Knee Extension 5/5   Left Knee Flexion 5/5   Left Knee Extension 5/5                     [facility] Adult PT Treatment/Exercise - 06/14/15 1427    Self-Care   Self-Care Posture;Other Self-Care Comments   Posture benefits of posture and how to maintain it throughout the day   Other Self-Care Comments  Anatomy education regarding low back joints and muscles with demonstration with spine model.   Lumbar Exercises: Stretches   Passive Hamstring Stretch 3 reps;30 seconds   Lower Trunk Rotation 5 reps   Quad Stretch Limitations quadratus lumborum stretch over bolster needed cues.     Lumbar Exercises: Aerobic   Stationary Bike NuStep L7 x 10 min                PT Education - 06/14/15  1812    Education provided Yes   Education Details anatomy education of the low back and spinal mechanics   Person(s) Educated Patient   Methods Explanation   Comprehension Verbalized understanding          PT Short Term Goals - 06/07/15 1745    PT SHORT TERM GOAL #1   Title PT will be I with basic HEP (05/24/2015)   Baseline independent   Time 3   Period Weeks   Status Achieved   PT SHORT TERM GOAL #2   Title Pt will be able to verbalize and demonstrate techniques to reduce low back pain/reinjury via postural awareness, lifting and carrying mechanics, and HEP (05/24/2015)   Baseline practiced today correct techniques  used   Time 3   Period Weeks   Status Achieved           PT Long Term Goals - 06/14/15 1814    PT LONG TERM GOAL #1   Title upon discharge pt will be I with all HEp given throughout therapy (06/14/2015)   Baseline cues needed   Time 6   Period Weeks   Status Partially Met   PT LONG TERM GOAL #2   Title pt will increase lumbar mobility by > 10 degrees in all planes to help with ADLS (04/09/1637)   Baseline continues to stretch   Time 6   Status Not Met   PT LONG TERM GOAL #3   Title pt will be able to tolerate standing/walking for > 30-45 minutes with < 2/10 Pain to increase endurace with personal goal of being able to cook and walk more (06/14/2015)   Baseline 15 minutes tolerated. pain inconstant.  sometimes no pain at all.   Time 6   Period Weeks   Status Partially Met   PT LONG TERM GOAL #4   Title pt will be able to perform lifting and carrying activities of > 10# while maintaining good posture to help with ADLs (06/14/2015)   Baseline light objects in clinic, patient says he can carry laundry basket at home   Time 6   Period Weeks   Status Partially Met   PT LONG TERM GOAL #5   Title pt will increase FOTO score to > 53 to indicated improvment of funcitonal capacity upon discharge (06/14/2015)   Time 6   Period Weeks   Status Not Met                Plan - 06/14/15 1812    Clinical Impression Statement [name] has made very little  progress with therapy and continues to report pain in the back with all trunk movements. Discussed with pt if he feels like he is making progress with therapy and he reports no. At this point his pain may need to be further assessed of controlled medically by his physician and pt agreed. At this point pt will be D/C from PT.    PT Next Visit Plan D/C   PT Home Exercise Plan HEP review   Consulted and Agree with Plan of Care Patient          G-Codes - June 28, 2015 1815    Functional Assessment Tool Used FOTO 57% limitation   Functional Limitation Changing and maintaining body position   Changing and Maintaining Body Position Goal Status (Y2233) At least 40 percent but less than 60 percent impaired, limited or restricted   Changing and Maintaining Body Position Discharge Status (K1224) At least 40 percent but less than 60 percent impaired, limited or restricted      Problem List Patient Active Problem List   Diagnosis Date Noted  . Right flank pain, chronic 06/08/2015  . 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 Thoracic scoliosis 03/30/2015  . Facet syndrome, lumbar 03/30/2015  . Myofascial pain on right side 03/30/2015  . Mild dementia 03/14/2015  . Abnormal LFTs   . Chest pain 03/10/2015  . Left shoulder pain 02/01/2015  . Pleuritic chest pain 02/01/2015  . Abnormal CXR 12/16/2014  . Chronic back pain 12/16/2014  . Unsteady gait 12/02/2014  . Memory loss 07/19/2013  . Loss of weight 07/16/2013  . Chest pain with low risk for cardiac etiology 07/12/2013  . Protein-calorie malnutrition, severe 07/12/2013  . Subacute confusional state 05/28/2013  . Gait disorder 05/28/2013  . Subarachnoid hemorrhage following injury 04/16/2013  . Shoulder dislocation-right 04/16/2013  . Intermittent low back pain 04/15/2012  . Constipation 03/04/2012  .  Dysuria 03/04/2012  . Pneumonia 01/29/2012  . Dizziness 12/18/2011  . Balance disorder 12/18/2011  . Pre-ulcerative corn or callous 10/13/2011  . DJD (degenerative joint disease), lumbar 04/01/2011  . CAD (coronary artery disease) 03/29/2011  . Prostate cancer 03/29/2011  . 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 HTN (hypertension) 03/29/2011  . Hyperlipidemia 03/29/2011  . BPH (benign prostatic hypertrophy) 03/29/2011  . GERD (gastroesophageal reflux disease) 03/29/2011  . Preventative health care 03/29/2011  . S/P cholecystectomy 03/29/2011  . Allergic rhinitis 03/29/2011  . S/P partial gastrectomy 03/29/2011  . Anemia 03/29/2011   Starr Lake PT, DPT, LAT, ATC  06/28/15  6:18 PM      [facility] [address] [place], [place], [address] Phone: [phone]   Fax:  [phone]     PHYSICAL THERAPY DISCHARGE SUMMARY  Visits from Start of Care: 9  Current functional level related to goals / functional outcomes: FOTO 57% limited   Remaining deficits: Back pain, limited trunk mobility,Scoliosis, decreased balance, core weakness   Education / Equipment: HEP and anatomy education of low back  Plan: Patient agrees to discharge.  Patient goals were not met. Patient is being discharged due to lack of progress.  ?????

## 2015-06-20 ENCOUNTER — Other Ambulatory Visit: Payer: Self-pay | Admitting: Physical Medicine & Rehabilitation

## 2015-06-29 ENCOUNTER — Ambulatory Visit: Payer: Medicare Other | Admitting: Physical Medicine & Rehabilitation

## 2015-06-29 ENCOUNTER — Encounter: Payer: Self-pay | Admitting: Physical Medicine & Rehabilitation

## 2015-06-29 ENCOUNTER — Ambulatory Visit (HOSPITAL_BASED_OUTPATIENT_CLINIC_OR_DEPARTMENT_OTHER): Payer: Medicare Other | Admitting: Physical Medicine & Rehabilitation

## 2015-06-29 VITALS — BP 148/47 | HR 77

## 2015-06-29 DIAGNOSIS — G894 Chronic pain syndrome: Secondary | ICD-10-CM | POA: Diagnosis not present

## 2015-06-29 DIAGNOSIS — M47816 Spondylosis without myelopathy or radiculopathy, lumbar region: Secondary | ICD-10-CM | POA: Diagnosis not present

## 2015-06-29 MED ORDER — TRAMADOL HCL 50 MG PO TABS: 50.0000 mg | ORAL_TABLET | Freq: Two times a day (BID) | ORAL | Status: DC

## 2015-06-29 NOTE — Patient Instructions (Signed)

## 2015-06-29 NOTE — Progress Notes (Signed)
  [facility]   Name: DECLYN DELSOL DOB:01-17-1920 MRN: [id number]  Date:06/29/2015  Physician: Alysia Penna, MD    Nurse/CMA: Lonn Georgia  Allergies: No Known Allergies  Consent Signed: Yes.    Is patient diabetic? No.  CBG today?   Pregnant: No. LMP: No LMP for male patient. (age 79-55)  Anticoagulants: no Anti-inflammatory: no Antibiotics: no  Procedure: Bilateral Medial Branch Block  Position: Prone Start Time: 11:13 am  End Time: 11:19 am  Fluoro Time: 20  RN/CMA Kayla Kayla    Time 11:13 11:22    BP 148/47 174/74    Pulse 77 104    Respirations 16 16    O2 Sat 98 96    S/S 6 6    Pain Level 2 1     D/C home with Daughter Almyra Free, patient A & O X 3, D/C instructions reviewed, and sits independently.

## 2015-06-29 NOTE — Progress Notes (Signed)
Right lumbar L2, L3, L4 medial branch blocks and L5 dorsal ramus injection under fluoroscopic guidance  Indication: Right Lumbar pain which is not relieved by medication management or other conservative care and interfering with self-care and mobility.  Informed consent was obtained after describing risks and benefits of the procedure with the patient, this includes bleeding, bruising, infection, paralysis and medication side effects. The patient wishes to proceed and has given written consent. The patient was placed in a prone position. The lumbar area was marked and prepped with Betadine. One ML of 1% lidocaine was injected into each of 3 areas into the skin and subcutaneous tissue. Then a 22-gauge 3.5in spinal needle was inserted targeting the junction of the Right S1 superior articular process and sacral ala junction. Needle was advanced under fluoroscopic guidance. Bone contact was made. Omnipaque 180 was injected x0.5 mL demonstrating no intravascular uptake. Then a solution containing one ML of 4 mg per mL dexamethasone and 3 mL of 2% MPF lidocaine was injected x0.5 mL. Then the Right L5 superior articular process in transverse process junction was targeted. Bone contact was made. Omnipaque 180 was injected x0.5 mL demonstrating no intravascular uptake. Then a solution containing one ML of 4 mg per mL dexamethasone and 3 mL of 2% MPF lidocaine was injected x0.5 mL. Then the Right L4 superior articular process in transverse process junction was targeted. Bone contact was made. Omnipaque 180 was injected x0.5 mL demonstrating no intravascular uptake. Then a solution containing one ML of 4 mg per mL dexamethasone and 3 mL of 2% MPF lidocaine was injected x0.5 mL.Then the Right L3 superior articular process in transverse process junction was targeted. Bone contact was made. Omnipaque 180 was injected x0.5 mL demonstrating no intravascular uptake. Then a solution containing one ML of 4 mg per mL dexamethasone  and 3 mL of 2% MPF lidocaine was injected x0.5 mL Patient tolerated procedure well. Post procedure instructions were given. Please refer to post procedure form.

## 2015-07-24 ENCOUNTER — Encounter: Payer: Medicare Other | Attending: Physical Medicine & Rehabilitation

## 2015-07-24 ENCOUNTER — Ambulatory Visit (HOSPITAL_BASED_OUTPATIENT_CLINIC_OR_DEPARTMENT_OTHER): Payer: Medicare Other | Admitting: Physical Medicine & Rehabilitation

## 2015-07-24 ENCOUNTER — Encounter: Payer: Self-pay | Admitting: Physical Medicine & Rehabilitation

## 2015-07-24 VITALS — BP 146/85 | HR 76

## 2015-07-24 DIAGNOSIS — M791 Myalgia: Secondary | ICD-10-CM | POA: Diagnosis not present

## 2015-07-24 DIAGNOSIS — M545 Low back pain: Secondary | ICD-10-CM | POA: Insufficient documentation

## 2015-07-24 DIAGNOSIS — M4124 Other idiopathic scoliosis, thoracic region: Secondary | ICD-10-CM | POA: Diagnosis not present

## 2015-07-24 DIAGNOSIS — G894 Chronic pain syndrome: Secondary | ICD-10-CM | POA: Insufficient documentation

## 2015-07-24 DIAGNOSIS — Z5181 Encounter for therapeutic drug level monitoring: Secondary | ICD-10-CM | POA: Diagnosis not present

## 2015-07-24 DIAGNOSIS — G588 Other specified mononeuropathies: Secondary | ICD-10-CM | POA: Insufficient documentation

## 2015-07-24 DIAGNOSIS — G548 Other nerve root and plexus disorders: Secondary | ICD-10-CM

## 2015-07-24 DIAGNOSIS — Z79899 Other long term (current) drug therapy: Secondary | ICD-10-CM | POA: Insufficient documentation

## 2015-07-24 MED ORDER — LIDOCAINE 5 % EX OINT: 1.0000 "application " | TOPICAL_OINTMENT | Freq: Two times a day (BID) | CUTANEOUS | Status: DC | PRN

## 2015-07-24 MED ORDER — TRAMADOL HCL 50 MG PO TABS: 50.0000 mg | ORAL_TABLET | Freq: Two times a day (BID) | ORAL | Status: DC

## 2015-07-24 NOTE — Progress Notes (Signed)
Mr. Mcphee is a 81 year old male with a history of right-sided back , Upper abdominal and flank pain.He has a history of cholecystectomy. He underwent hepatobiliary scan as well as MRI of the abdomen to evaluate his right-sided flank and upper abdominal pain.  These were normal.  He had a fall about 2 years ago, he had x-rays of his left shoulder which are negative as well as left ribs which were negative as well. He states he dislocated the right shoulder in the fall 2 years ago.No right shoulder films were done at that time  Patient underwent right L2-L3 L4-L5 medial branch blocks ~ 4 weeks ago.He feels that the right-sided pain did not improve after the procedure although at that time he did report a 50% reduction.  Review of systems negative for radiating pain down the right lower extremity  Examination Gen. No acute distress  Mood and affect are appropriate Extremities without edema Ambulation without evidence of toe drag or knee instability He has tenderness to palpation along the mid axillary line right side at ribs T9 T10 T11. Skin has no sign of rash.  Impression 1. 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 He's had workup for GI causes which was negative.He did not respond significantly to medial branch blocks. He does have point tenderness along the right lateral ribs. We'll trial lidocaine Topical. I have drawn with the marker at the area that I would like treated with Lidocaine 5% ointment. The shape and area is about the size of a computer mouse. We discussed not using the medication in regions other than what has been outlined  Return to clinic one month, if no improvement would recommend intercostal nerve blocks under ultrasound guidance

## 2015-07-24 NOTE — Patient Instructions (Addendum)
Intercostal Nerve Block Patient Information  Description: The twelve intercostal nerves arise from the first thru twelfth thoracic nerve roots.  The nerve begins at the spine and wraps around the body, lying in a groove underneath each rib.  Each intercostal nerve innervates a specific strip of skin and body walk of the abdomen and chest.  Therefore, injuries of the chest wall or abdominal wall result in pain that is transmitted back to the brian via the intercostal nerves.  Examples of such injuries include rib fractures and incisions for lung and gall bladder surgery.  Occasionally, pain may persist long after an injury or surgical incision secondary to inflammation and irritation of the intercostal nerve.  The longstanding pain is known as intercostal neuralgia.  An intercostal nerve block is preformed to eliminate pain either temporarily or permanently.  A small needle is placed below the rib and local anesthetic (like Novocaine) and possibly steroid is injected.  Usually 2-4 intercostal nerves are blocked at a time depending on the problem.  The patient will experience a slight "pin-prick" sensation for each injection.  Shortly thereafter, the strip of skin that is innervated by the blocked intercostal nerve will feel numb.  Persistent pain that is only temporarily relieved with local anesthetic may require a more permanent block. This procedure is called Cryoneurolysis and entails placing a small probe beneath the rib to freeze the nerve.  Conditions that may be treated by intercostal nerve blocks:   Rib fractures  Longstanding pain from surgery of the chest or abdomen (intercostal neuralgia)  Pain from chest tubes  Pain from trauma to the chest  Preparation for the injections:  1. Do not eat any solid food or dairy products within 6 hours of your appointment. 2. You may drink clear liquids up to 2 hours before appointment.  Clear liquids include water, black coffee, juice or soda.  No milk  or cream please. 3. You may take your regular medication, including pain medications, with a sip of water before your appointment.  Diabetics should hold regular insulin (if take separately) and take 1/2 normal NPH dose the morning of the procedure.   Carry some sugar containing items with you to your appointment. 4. A driver must accompany you and be prepared to drive you home after your procedure. 5. Bring all your current medications with you. 6. An IV may be inserted and sedation may be given at the discretion of the physician. 7. A blood pressure cuff, EKG and other monitors will often be applied during the procedure.  Some patients may need to have extra oxygen administered for a short period. 8. You will be asked to provide medical information, including your allergies, prior to the procedure.  We must know immediately if you are taking blood thinners (like Coumadin/Warfarin) or if you are allergic to IV iodine contrast (dye). We must know if you could possible be pregnant.  Possible side-effects:   Bleeding from needle site  Infection (rare)  Nerve injury (rare)  Numbness & tingling of skin  Collapsed lung requiring chest tube (rare)  Local anesthetic toxicity (rare)  Light-headedness (temporary)  Pain at injection site (several days)  Decreased blood pressure (temporary)  Shortness of breath  Jittery/shaking sensation (temporary)  Call if you experience:   Difficulty breathing or hives (go directly to the emergency room)  Redness, inflammation or drainage at the injection site  Severe pain at the site of the injection  Any new symptoms which are concerning   Please note:  Your pain may subside immediately but may return several hours after the injection.  Often, more than one injection is required to reduce the pain. Also, if several temporary blocks with local anesthetic are ineffective, a more permanent block with cryolysis may be necessary.  This will be  discussed with you should this be the case.

## 2015-08-29 ENCOUNTER — Ambulatory Visit: Payer: Medicare Other | Admitting: Physical Medicine & Rehabilitation

## 2015-09-04 ENCOUNTER — Encounter: Payer: Medicare Other | Attending: Physical Medicine & Rehabilitation

## 2015-09-04 ENCOUNTER — Ambulatory Visit (HOSPITAL_BASED_OUTPATIENT_CLINIC_OR_DEPARTMENT_OTHER): Payer: Medicare Other | Admitting: Physical Medicine & Rehabilitation

## 2015-09-04 ENCOUNTER — Encounter: Payer: Self-pay | Admitting: Physical Medicine & Rehabilitation

## 2015-09-04 VITALS — BP 140/74 | HR 60 | Resp 14

## 2015-09-04 DIAGNOSIS — G894 Chronic pain syndrome: Secondary | ICD-10-CM | POA: Diagnosis not present

## 2015-09-04 DIAGNOSIS — M549 Dorsalgia, unspecified: Secondary | ICD-10-CM | POA: Diagnosis not present

## 2015-09-04 DIAGNOSIS — G8929 Other chronic pain: Secondary | ICD-10-CM

## 2015-09-04 DIAGNOSIS — M4124 Other idiopathic scoliosis, thoracic region: Secondary | ICD-10-CM | POA: Diagnosis not present

## 2015-09-04 DIAGNOSIS — R1011 Right upper quadrant pain: Secondary | ICD-10-CM

## 2015-09-04 DIAGNOSIS — M545 Low back pain: Secondary | ICD-10-CM | POA: Diagnosis not present

## 2015-09-04 DIAGNOSIS — M791 Myalgia: Secondary | ICD-10-CM | POA: Diagnosis not present

## 2015-09-04 DIAGNOSIS — Z5181 Encounter for therapeutic drug level monitoring: Secondary | ICD-10-CM | POA: Diagnosis not present

## 2015-09-04 DIAGNOSIS — Z79899 Other long term (current) drug therapy: Secondary | ICD-10-CM | POA: Insufficient documentation

## 2015-09-04 DIAGNOSIS — R109 Unspecified abdominal pain: Principal | ICD-10-CM

## 2015-09-04 MED ORDER — TRAMADOL HCL 50 MG PO TABS: 50.0000 mg | ORAL_TABLET | Freq: Three times a day (TID) | ORAL | Status: DC

## 2015-09-04 NOTE — Progress Notes (Signed)
Subjective:    Patient ID: Eric Cooley, male    DOB: Mar 15, 1920, 81 y.o.   MRN: [id number]  HPI   Pain Inventory Average Pain 6 Pain Right Now 2 My pain is ?  In the last 24 hours, has pain interfered with the following? General activity 4 Relation with others 6 Enjoyment of life 10 What TIME of day is your pain at its worst? evening Sleep (in general) Good  Pain is worse with: bending Pain improves with: medication Relief from Meds: 6  Mobility walk without assistance how many minutes can you walk? 20 ability to climb steps?  yes do you drive?  yes  Function retired  Neuro/Psych No problems in this area  Prior Studies Any changes since last visit?  no  Physicians involved in your care Any changes since last visit?  no   Family History  Problem Relation Age of Onset  . Arthritis Mother   . Ovarian cancer Mother   . Heart failure Mother   . Arthritis Father   . Lung cancer Father   . Colon cancer Neg Hx    Social History   Social History  . Marital Status: Widowed    Spouse Name: N/A  . Number of Children: 3  . Years of Education: 16   Occupational History  . retired Art gallery manager    Social History Main Topics  . Smoking status: Former Smoker    Quit date: 10/07/1960  . Smokeless tobacco: Never Used     Comment: Stopped smoking in 1962  . Alcohol Use: 0.6 oz/week    1 Glasses of wine per week     Comment: very rare  . Drug Use: No  . Sexual Activity: Not Asked   Other Topics Concern  . None   Social History Narrative   Past Surgical History  Procedure Laterality Date  . Coronary angioplasty with stent placement  04/24/2009    2 Xience DES to LAD 3.5x68mm and 3.0x37mm overlapping  . Tonsillectomy  1951  . Cholecystectomy  1986  . Partial gastrectomy  10/2009    GIST  . Transthoracic echocardiogram  10/09/2009    EF 55-60%; normaml LV systolic function with grade 1 diastolic dysfunction  . Gastric artery emoblization Left  05/2009   Past Medical History  Diagnosis Date  . Hypertension   . MI (myocardial infarction) ([place])   . Gastrointestinal stromal tumor (GIST) of stomach 05/2009, 10/2009    2010: gastric ulcer with GIST: IR embolization left gastric artery.  2011: partial gastrectomy  . CAD (coronary artery disease) 05/2012  . HTN (hypertension) 03/29/2011  . Hyperlipidemia 03/29/2011  . BPH (benign prostatic hypertrophy) 03/29/2011  . GERD (gastroesophageal reflux disease) 03/29/2011  . S/P cholecystectomy 03/29/2011  . 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 Allergic rhinitis, cause unspecified 03/29/2011  . Anemia 05/2009    blood loss anemia, transfused 6 units.   . Arthritis   . Urine incontinence   . Low back pain   . DJD (degenerative joint disease), lumbar 04/01/2011  . Prostate cancer ([place]) 03/29/2011  . History of nuclear stress test 03/27/2010    exercise; no ischemic changes, normal study   . Colon polyp 05/2012    cecal, ascending tubular adenomas.    BP 140/74 mmHg  Pulse 60  Resp 14  SpO2 98%  Opioid Risk Score:   Fall Risk Score:  `1  Depression screen PHQ 2/9  Depression screen [facility] 2/9 06/08/2015 03/30/2015  Decreased Interest 0 0  Down, Depressed, Hopeless 0 0  PHQ - 2 Score 0 0  Altered sleeping - 0  Tired, decreased energy - 0  Change in appetite - 0  Feeling bad or failure about yourself  - 0  Trouble concentrating - 0  Moving slowly or fidgety/restless - 0  Suicidal thoughts - 0  PHQ-9 Score - 0     Review of Systems  All other systems reviewed and are negative.      Objective:   Physical Exam        Assessment & Plan:

## 2015-09-04 NOTE — Patient Instructions (Signed)
May take up to 3 tramadol per day

## 2015-09-04 NOTE — Progress Notes (Signed)
81 year old male with 12 year history of low back pain this is mainly right-sided, insidious onset, no history of trauma. Pain worsened after a fall possibly 2 years ago where he dislocated his right shoulder.  On 06/29/2015 underwent right-sided L2-L3 L4-L5 medial branch blocks.These were of no benefit to him.  He does get good relief with tramadol 50 mg twice a day is generally sufficient but he does have days when he needs to take 3 a day. He gets mild constipation with this medication but this is relieved with Senokot. He is accompanied by his daughter  Patient has occasional pain with leaning forward. This is mainly in the afternoon. He does well for most of the day. He does not stay up at night due to pain.  He remains independent with all his self-care and mobility.   Review of systems no bowel or bladder dysfunction, no chest pains or shortness of breath, no abdominal pain  Neuro:  Eyes without evidence of nystagmus  Tone is normal without evidence of spasticity Cerebellar exam shows no evidence of ataxia on finger nose finger or heel to shin testing No evidence of trunkal ataxia  Motor strength is 5/5 in bilateral deltoid, biceps, triceps, finger flexors and extensors, wrist flexors and extensors, hip flexors, knee flexors and extensors, ankle dorsiflexors, plantar flexors, invertors and evertors, toe flexors and extensors  Sensory exam is normal to pinprick, proprioception and light touch in the upper and lower limbs    Lumbar spine has no tenderness to palpation along the paraspinal muscles or lungs by his processes. There is no tenderness along the thoracic spine. He has limited lumbar range of motion with flexion extension and lateral rotation and bending. There is limited range of motion thoracic spine as well No tenderness to palpation along the ribs either posteriorly or laterally or anteriorly Clinical Data: 10-year history of low back pain.  MRI LUMBAR SPINE WITHOUT  CONTRAST  Technique: Multiplanar and multiecho pulse sequences of the lumbar spine were obtained without intravenous contrast.  Comparison: Lumbar spine radiographs 10/11/2008.  Findings: Normal signal is present in the conus medullaris which terminates at L1-2, within normal limits. Schmorl's nodes are present at L1-2, L2-3, and L3-4. The vertebral body heights appear somewhat shortened but without the appearance of compression fracture. No acute fracture or edema is evident. Slight degenerative retrolisthesis is present at L1-2, L2-3, and L3-4. Rightward curvature lumbar spine is centered at L2. There is leftward curvature centered at L4.  Diverticular changes are present within the sigmoid colon. Limited imaging of the abdomen is otherwise unremarkable.  T12-L1: Mild disc bulging is present. There is no significant stenosis.  L1-2: Slight leftward disc bulging is present. There is no significant stenosis.  L2-3: Leftward disc herniation is present. Mild facet hypertrophy is noted. This results in mild left lateral recess and foraminal narrowing. The right foramen is patent.  L3-4: A mild broad-based disc herniation is present. Mild facet hypertrophy is worse on the left. This results in mild bilateral foraminal and lateral recess narrowing.  L4-5: A rightward disc herniation is present. Mild facet hypertrophy is evident. This results in mild right lateral recess and bilateral foraminal narrowing, worse on the right.  L5-S1: Mild facet hypertrophy is present. There is no significant disc herniation or stenosis.  IMPRESSION:  1. Mild scoliosis and multilevel Schmorl's nodes of the lumbar spine without exaggerated kyphosis. 2. Mild left lateral recess and foraminal narrowing at L2-3. 3. Mild by mild bilateral foraminal and lateral recess narrowing  at L3-4 secondary to disc herniation and facet hypertrophy. 4. Mild right lateral  recess and bilateral foraminal narrowing at L4-5 secondary to a rightward disc herniation and facet hypertrophy. 5. Mild facet hypertrophy bilaterally at L5-S1 without significant disc herniation or stenosis.     Assessment 1. Chronic low back pain multifactorial. He appears to have upper lumbar pain last MRI was relatively unremarkable in 2013, Medial branch blocks were not helpful and therefore I do not think this is a facet mediated pain. He has had good relief with tramadol and this has allowed him to remain functional and independent.  2. Right-sided rib pain this has improved. This was more apparent one to 2 months ago. Looking at imaging studies he actually had left-sided rib films Earlier this year. Given he has a benign examination I would not recommend repeat. Discussed risks and benefits of tramadol, follow-up in 6 months

## 2015-09-20 ENCOUNTER — Ambulatory Visit: Payer: Medicare Other | Admitting: Internal Medicine

## 2015-09-28 ENCOUNTER — Ambulatory Visit (INDEPENDENT_AMBULATORY_CARE_PROVIDER_SITE_OTHER): Payer: Medicare Other | Admitting: Internal Medicine

## 2015-09-28 ENCOUNTER — Other Ambulatory Visit (INDEPENDENT_AMBULATORY_CARE_PROVIDER_SITE_OTHER): Payer: Medicare Other

## 2015-09-28 ENCOUNTER — Ambulatory Visit: Payer: Medicare Other

## 2015-09-28 ENCOUNTER — Encounter: Payer: Self-pay | Admitting: Internal Medicine

## 2015-09-28 VITALS — BP 114/62 | HR 93 | Temp 97.7°F | Ht 67.0 in | Wt 127.0 lb

## 2015-09-28 DIAGNOSIS — I10 Essential (primary) hypertension: Secondary | ICD-10-CM | POA: Diagnosis not present

## 2015-09-28 DIAGNOSIS — Z0000 Encounter for general adult medical examination without abnormal findings: Secondary | ICD-10-CM | POA: Diagnosis not present

## 2015-09-28 DIAGNOSIS — E785 Hyperlipidemia, unspecified: Secondary | ICD-10-CM

## 2015-09-28 DIAGNOSIS — Z23 Encounter for immunization: Secondary | ICD-10-CM

## 2015-09-28 LAB — CBC WITH DIFFERENTIAL/PLATELET
BASOS ABS: 0 10*3/uL (ref 0.0–0.1)
BASOS PCT: 0.4 % (ref 0.0–3.0)
EOS PCT: 1.4 % (ref 0.0–5.0)
Eosinophils Absolute: 0.1 10*3/uL (ref 0.0–0.7)
HEMATOCRIT: 41.2 % (ref 39.0–52.0)
Hemoglobin: 13.5 g/dL (ref 13.0–17.0)
LYMPHS PCT: 21.9 % (ref 12.0–46.0)
Lymphs Abs: 1.2 10*3/uL (ref 0.7–4.0)
MCHC: 32.7 g/dL (ref 30.0–36.0)
MCV: 97.6 fl (ref 78.0–100.0)
MONOS PCT: 7.6 % (ref 3.0–12.0)
Monocytes Absolute: 0.4 10*3/uL (ref 0.1–1.0)
NEUTROS ABS: 3.8 10*3/uL (ref 1.4–7.7)
Neutrophils Relative %: 68.7 % (ref 43.0–77.0)
PLATELETS: 195 10*3/uL (ref 150.0–400.0)
RBC: 4.22 Mil/uL (ref 4.22–5.81)
RDW: 15 % (ref 11.5–15.5)
WBC: 5.6 10*3/uL (ref 4.0–10.5)

## 2015-09-28 LAB — HEPATIC FUNCTION PANEL
ALT: 13 U/L (ref 0–53)
AST: 22 U/L (ref 0–37)
Albumin: 3.9 g/dL (ref 3.5–5.2)
Alkaline Phosphatase: 76 U/L (ref 39–117)
BILIRUBIN DIRECT: 0 mg/dL (ref 0.0–0.3)
BILIRUBIN TOTAL: 0.4 mg/dL (ref 0.2–1.2)
TOTAL PROTEIN: 7 g/dL (ref 6.0–8.3)

## 2015-09-28 LAB — LIPID PANEL
CHOL/HDL RATIO: 2
Cholesterol: 230 mg/dL — ABNORMAL HIGH (ref 0–200)
HDL: 98.2 mg/dL (ref >39.00)
LDL CALC: 112 mg/dL — AB (ref 0–99)
NONHDL: 131.84
Triglycerides: 101 mg/dL (ref 0.0–149.0)
VLDL: 20.2 mg/dL (ref 0.0–40.0)

## 2015-09-28 LAB — URINALYSIS, ROUTINE W REFLEX MICROSCOPIC
Bilirubin Urine: NEGATIVE
Ketones, ur: NEGATIVE
Leukocytes, UA: NEGATIVE
Nitrite: NEGATIVE
PH: 5.5 (ref 5.0–8.0)
Specific Gravity, Urine: >=1.03 — AB (ref 1.000–1.030)
TOTAL PROTEIN, URINE-UPE24: NEGATIVE
URINE GLUCOSE: NEGATIVE
UROBILINOGEN UA: 0.2 (ref 0.0–1.0)
WBC UA: NONE SEEN (ref 0–?)

## 2015-09-28 LAB — BASIC METABOLIC PANEL
BUN: 29 mg/dL — ABNORMAL HIGH (ref 6–23)
CALCIUM: 9.7 mg/dL (ref 8.4–10.5)
CO2: 30 mEq/L (ref 19–32)
CREATININE: 1.23 mg/dL (ref 0.40–1.50)
Chloride: 105 mEq/L (ref 96–112)
GFR: 58.31 mL/min — AB (ref >60.00)
Glucose, Bld: 91 mg/dL (ref 70–99)
Potassium: 4.1 mEq/L (ref 3.5–5.1)
SODIUM: 142 meq/L (ref 135–145)

## 2015-09-28 LAB — TSH: TSH: 2.39 u[IU]/mL (ref 0.35–4.50)

## 2015-09-28 NOTE — Patient Instructions (Addendum)
Your EKG was OK  Please continue all other medications as before, and refills have been done if requested.  Please have the pharmacy call with any other refills you may need.  Please continue your efforts at being more active, low cholesterol diet, and weight control.  You are otherwise up to date with prevention measures today.  Please keep your appointments with your specialists as you may have planned  Please go to the LAB in the Basement (turn left off the elevator) for the tests to be done today  You will be contacted by phone if any changes need to be made immediately.  Otherwise, you will receive a letter about your results with an explanation, but please check with MyChart first.  Please remember to sign up for MyChart if you have not done so, as this will be important to you in the future with finding out test results, communicating by private email, and scheduling acute appointments online when needed.  Please return in 6 months, or sooner if needed

## 2015-09-28 NOTE — Progress Notes (Signed)
Pre visit review using our clinic review tool, if applicable. No additional management support is needed unless otherwise documented below in the visit note. 

## 2015-09-28 NOTE — Progress Notes (Signed)
Subjective:    Patient ID: Eric Cooley, male    DOB: [birth year], 81 y.o.   MRN: [id number]  HPI  Here for yearly f/u;  Overall doing ok;  Pt denies Chest pain, worsening SOB, DOE, wheezing, orthopnea, PND, worsening LE edema, palpitations, dizziness or syncope.  Pt denies neurological change such as new headache, facial or extremity weakness.  Pt denies polydipsia, polyuria, or low sugar symptoms. Pt states overall good compliance with treatment and medications, good tolerability, and has been trying to follow appropriate diet.  Pt denies worsening depressive symptoms, suicidal ideation or panic. No fever, night sweats, wt loss, loss of appetite, or other constitutional symptoms.  Pt states good ability with ADL's, has low fall risk, home safety reviewed and adequate, no other significant changes in hearing or vision. Pt continues to have recurring LBP without change in severity, bowel or bladder change, fever, wt loss,  worsening LE pain/numbness/weakness, gait change or falls, tramadol helps, now taking at 3 times per day.  Due for flu shot Past Medical History  Diagnosis Date  . Hypertension   . MI (myocardial infarction) ([place])   . Gastrointestinal stromal tumor (GIST) of stomach 05/2009, 10/2009    2010: gastric ulcer with GIST: IR embolization left gastric artery.  2011: partial gastrectomy  . CAD (coronary artery disease) 05/2012  . HTN (hypertension) 03/29/2011  . Hyperlipidemia 03/29/2011  . BPH (benign prostatic hypertrophy) 03/29/2011  . GERD (gastroesophageal reflux disease) 03/29/2011  . S/P cholecystectomy 03/29/2011  . 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 Allergic rhinitis, cause unspecified 03/29/2011  . Anemia 05/2009    blood loss anemia, transfused 6 units.   . Arthritis   . Urine incontinence   . Low back pain   . DJD (degenerative joint disease), lumbar 04/01/2011  . Prostate cancer ([place]) 03/29/2011  . History of nuclear stress test  03/27/2010    exercise; no ischemic changes, normal study   . Colon polyp 05/2012    cecal, ascending tubular adenomas.    Past Surgical History  Procedure Laterality Date  . Coronary angioplasty with stent placement  04/24/2009    2 Xience DES to LAD 3.5x3mm and 3.0x38mm overlapping  . Tonsillectomy  1951  . Cholecystectomy  1986  . Partial gastrectomy  10/2009    GIST  . Transthoracic echocardiogram  10/09/2009    EF 55-60%; normaml LV systolic function with grade 1 diastolic dysfunction  . Gastric artery emoblization Left 05/2009    reports that he quit smoking about 55 years ago. He has never used smokeless tobacco. He reports that he drinks about 0.6 oz of alcohol per week. He reports that he does not use illicit drugs. family history includes Arthritis in his father and mother; Heart failure in his mother; Lung cancer in his father; Ovarian cancer in his mother. There is no history of Colon cancer. No Known Allergies Current Outpatient Prescriptions on File Prior to Visit  Medication Sig Dispense Refill  . aspirin 81 MG EC tablet Take 1 tablet (81 mg total) by mouth daily. 30 tablet 12  . cetirizine (ZYRTEC) 10 MG tablet Take 1 tablet (10 mg total) by mouth daily. 30 tablet 11  . donepezil (ARICEPT) 5 MG tablet Take 1 tablet (5 mg total) by mouth at bedtime. 90 tablet 3  . lidocaine (XYLOCAINE) 5 % ointment Apply 1 application topically 2 (two) times daily as needed. Right lower ribs 35.44 g 0  . lisinopril (PRINIVIL,ZESTRIL) 5 MG tablet  Take 1 tablet (5 mg total) by mouth daily. 90 tablet 3  . ondansetron (ZOFRAN) 8 MG tablet Take 1 tablet (8 mg total) by mouth every 8 (eight) hours as needed for nausea or vomiting. 20 tablet 0  . pantoprazole (PROTONIX) 20 MG tablet Take 1 tablet (20 mg total) by mouth daily. 90 tablet 3  . senna (SENOKOT) 8.6 MG tablet Take 1 tablet by mouth 2 (two) times daily.    . traMADol (ULTRAM) 50 MG tablet Take 1 tablet (50 mg total) by mouth 3 (three) times  daily. 90 tablet 5  . triamcinolone (NASACORT AQ) 55 MCG/ACT AERO nasal inhaler Place 2 sprays into the nose daily. 1 Inhaler 12  . [DISCONTINUED] fluticasone (FLONASE) 50 MCG/ACT nasal spray Place 2 sprays into the nose daily.       No current facility-administered medications on file prior to visit.       Review of Systems Constitutional: Negative for increased diaphoresis, other activity, appetite or siginficant weight change other than noted HENT: Negative for worsening hearing loss, ear pain, facial swelling, mouth sores and neck stiffness.   Eyes: Negative for other worsening pain, redness or visual disturbance.  Respiratory: Negative for shortness of breath and wheezing  Cardiovascular: Negative for chest pain and palpitations.  Gastrointestinal: Negative for diarrhea, blood in stool, abdominal distention or other pain Genitourinary: Negative for hematuria, flank pain or change in urine volume.  Musculoskeletal: Negative for myalgias or other joint complaints.  Skin: Negative for color change and wound or drainage.  Neurological: Negative for syncope and numbness. other than noted Hematological: Negative for adenopathy. or other swelling Psychiatric/Behavioral: Negative for hallucinations, SI, self-injury, decreased concentration or other worsening agitation.      Objective:   Physical Exam BP 114/62 mmHg  Pulse 93  Temp(Src) 97.7 F (36.5 C) (Oral)  Ht 5\' 7"  (1.702 m)  Wt 127 lb (57.607 kg)  BMI 19.89 kg/m2  SpO2 97% VS noted,  Constitutional: Pt is oriented to person, place, and time. Appears well-developed and well-nourished, in no significant distress Head: Normocephalic and atraumatic.  Right Ear: External ear normal.  Left Ear: External ear normal.  Nose: Nose normal.  Mouth/Throat: Oropharynx is clear and moist.  Eyes: Conjunctivae and EOM are normal. Pupils are equal, round, and reactive to light.  Neck: Normal range of motion. Neck supple. No JVD present. No  tracheal deviation present or significant neck LA or mass Cardiovascular: Normal rate, regular rhythm with ectopy vs irr irreg, normal heart sounds and intact distal pulses.   Pulmonary/Chest: Effort normal and breath sounds without rales or wheezing  Abdominal: Soft. Bowel sounds are normal. NT. No HSM  Musculoskeletal: Normal range of motion. Exhibits no edema.  Lymphadenopathy:  Has no cervical adenopathy.  Neurological: Pt is alert and oriented to person, place, and time. Pt has normal reflexes. No cranial nerve deficit. Motor grossly intact Skin: Skin is warm and dry. No rash noted.  Psychiatric:  Has normal mood and affect. Behavior is normal.     Assessment & Plan:

## 2015-09-29 ENCOUNTER — Other Ambulatory Visit: Payer: Self-pay | Admitting: Physical Medicine & Rehabilitation

## 2015-09-29 ENCOUNTER — Encounter: Payer: Self-pay | Admitting: Internal Medicine

## 2015-09-29 DIAGNOSIS — Z23 Encounter for immunization: Secondary | ICD-10-CM

## 2015-09-29 NOTE — Assessment & Plan Note (Addendum)
Overall doing well, age appropriate education and counseling updated, referrals for preventative services and immunizations addressed, dietary and smoking counseling addressed, most recent labs reviewed.  I have personally reviewed and have noted:  1) the patient's medical and social history 2) The pt's use of alcohol, tobacco, and illicit drugs 3) The patient's current medications and supplements 4) Functional ability including ADL's, fall risk, home safety risk, hearing and visual impairment 5) Diet and physical activities 6) Evidence for depression or mood disorder 7) The patient's height, weight, and BMI have been recorded in the chart  I have made referrals, and provided counseling and education based on review of the above ECG reviewed as per emr - no evidence for new afib

## 2015-10-12 ENCOUNTER — Other Ambulatory Visit: Payer: Self-pay | Admitting: *Deleted

## 2015-10-12 MED ORDER — TRAMADOL HCL 50 MG PO TABS: 50.0000 mg | ORAL_TABLET | Freq: Three times a day (TID) | ORAL | Status: DC

## 2016-01-09 ENCOUNTER — Other Ambulatory Visit: Payer: Self-pay

## 2016-01-09 MED ORDER — PANTOPRAZOLE SODIUM 20 MG PO TBEC: 20.0000 mg | DELAYED_RELEASE_TABLET | Freq: Every day | ORAL | Status: DC

## 2016-01-12 ENCOUNTER — Other Ambulatory Visit: Payer: Self-pay

## 2016-01-12 MED ORDER — DONEPEZIL HCL 5 MG PO TABS: 5.0000 mg | ORAL_TABLET | Freq: Every day | ORAL | Status: DC

## 2016-02-27 ENCOUNTER — Ambulatory Visit: Payer: Medicare Other | Admitting: Physical Medicine & Rehabilitation

## 2016-03-20 ENCOUNTER — Ambulatory Visit (INDEPENDENT_AMBULATORY_CARE_PROVIDER_SITE_OTHER): Payer: Medicare Other | Admitting: Internal Medicine

## 2016-03-20 ENCOUNTER — Encounter: Payer: Self-pay | Admitting: Internal Medicine

## 2016-03-20 VITALS — BP 120/76 | HR 66 | Resp 20 | Wt 132.0 lb

## 2016-03-20 DIAGNOSIS — K219 Gastro-esophageal reflux disease without esophagitis: Secondary | ICD-10-CM | POA: Diagnosis not present

## 2016-03-20 DIAGNOSIS — J309 Allergic rhinitis, unspecified: Secondary | ICD-10-CM

## 2016-03-20 DIAGNOSIS — F0390 Unspecified dementia without behavioral disturbance: Secondary | ICD-10-CM

## 2016-03-20 DIAGNOSIS — R6889 Other general symptoms and signs: Secondary | ICD-10-CM

## 2016-03-20 DIAGNOSIS — Z0001 Encounter for general adult medical examination with abnormal findings: Secondary | ICD-10-CM

## 2016-03-20 DIAGNOSIS — F03A0 Unspecified dementia, mild, without behavioral disturbance, psychotic disturbance, mood disturbance, and anxiety: Secondary | ICD-10-CM

## 2016-03-20 MED ORDER — MEMANTINE HCL-DONEPEZIL HCL ER 28-10 MG PO CP24: 1.0000 | ORAL_CAPSULE | Freq: Every day | ORAL | Status: DC

## 2016-03-20 MED ORDER — PANTOPRAZOLE SODIUM 40 MG PO TBEC: 40.0000 mg | DELAYED_RELEASE_TABLET | Freq: Every day | ORAL | Status: DC

## 2016-03-20 NOTE — Progress Notes (Signed)
Pre visit review using our clinic review tool, if applicable. No additional management support is needed unless otherwise documented below in the visit note. 

## 2016-03-20 NOTE — Progress Notes (Signed)
Subjective:    Patient ID: Eric Cooley, male    DOB: Aug 24, 1920, 82 y.o.   MRN: [id number]  HPI  Here to f/u; overall doing ok,  Pt denies chest pain, increasing sob or doe, wheezing, orthopnea, PND, increased LE swelling, palpitations, dizziness or syncope.  Pt denies new neurological symptoms such as new headache, or facial or extremity weakness or numbness.  Pt denies polydipsia, polyuria, or low sugar episode.   Pt denies new neurological symptoms such as new headache, or facial or extremity weakness or numbness.   Pt states overall good compliance with meds, mostly trying to follow appropriate diet, with wt overall up several lbs Wt Readings from Last 3 Encounters:  03/20/16 132 lb (59.875 kg)  09/28/15 127 lb (57.607 kg)  03/21/15 133 lb (60.328 kg)  Daughter with pt, asks for start namzaric as is tolerating the aricept ok, as ST memory is mildly worse. Dementia overall stable symptomatically and not assoc with behavioral changes such as hallucinations, paranoia, or agitation. Does also have several wks ongoing nasal allergy symptoms with clearish congestion, itch and sneezing, without fever, pain, ST, cough, swelling or wheezing., as well as left ear pressure with popping/crackling with talking. Does c/o mild persistent breakthrough worsening reflux, but no abd pain, dysphagia, n/v, bowel change or blood.   Past Medical History  Diagnosis Date  . Hypertension   . MI (myocardial infarction) ([place])   . Gastrointestinal stromal tumor (GIST) of stomach 05/2009, 10/2009    2010: gastric ulcer with GIST: IR embolization left gastric artery.  2011: partial gastrectomy  . CAD (coronary artery disease) 05/2012  . HTN (hypertension) 03/29/2011  . Hyperlipidemia 03/29/2011  . BPH (benign prostatic hypertrophy) 03/29/2011  . GERD (gastroesophageal reflux disease) 03/29/2011  . S/P cholecystectomy 03/29/2011  . 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 Allergic rhinitis,  cause unspecified 03/29/2011  . Anemia 05/2009    blood loss anemia, transfused 6 units.   . Arthritis   . Urine incontinence   . Low back pain   . DJD (degenerative joint disease), lumbar 04/01/2011  . Prostate cancer ([place]) 03/29/2011  . History of nuclear stress test 03/27/2010    exercise; no ischemic changes, normal study   . Colon polyp 05/2012    cecal, ascending tubular adenomas.    Past Surgical History  Procedure Laterality Date  . Coronary angioplasty with stent placement  04/24/2009    2 Xience DES to LAD 3.5x56mm and 3.0x80mm overlapping  . Tonsillectomy  1951  . Cholecystectomy  1986  . Partial gastrectomy  10/2009    GIST  . Transthoracic echocardiogram  10/09/2009    EF 55-60%; normaml LV systolic function with grade 1 diastolic dysfunction  . Gastric artery emoblization Left 05/2009    reports that he quit smoking about 55 years ago. He has never used smokeless tobacco. He reports that he drinks about 0.6 oz of alcohol per week. He reports that he does not use illicit drugs. family history includes Arthritis in his father and mother; Heart failure in his mother; Lung cancer in his father; Ovarian cancer in his mother. There is no history of Colon cancer. No Known Allergies Current Outpatient Prescriptions on File Prior to Visit  Medication Sig Dispense Refill  . aspirin 81 MG EC tablet Take 1 tablet (81 mg total) by mouth daily. 30 tablet 12  . cetirizine (ZYRTEC) 10 MG tablet Take 1 tablet (10 mg total) by mouth daily. 30 tablet 11  .  donepezil (ARICEPT) 5 MG tablet Take 1 tablet (5 mg total) by mouth at bedtime. 90 tablet 3  . lidocaine (XYLOCAINE) 5 % ointment Apply 1 application topically 2 (two) times daily as needed. Right lower ribs 35.44 g 0  . lisinopril (PRINIVIL,ZESTRIL) 5 MG tablet Take 1 tablet (5 mg total) by mouth daily. 90 tablet 3  . ondansetron (ZOFRAN) 8 MG tablet Take 1 tablet (8 mg total) by mouth every 8 (eight) hours as needed for nausea or vomiting. 20  tablet 0  . senna (SENOKOT) 8.6 MG tablet Take 1 tablet by mouth 2 (two) times daily.    . traMADol (ULTRAM) 50 MG tablet Take 1 tablet (50 mg total) by mouth 3 (three) times daily. 90 tablet 5  . triamcinolone (NASACORT AQ) 55 MCG/ACT AERO nasal inhaler Place 2 sprays into the nose daily. 1 Inhaler 12  . [DISCONTINUED] fluticasone (FLONASE) 50 MCG/ACT nasal spray Place 2 sprays into the nose daily.       No current facility-administered medications on file prior to visit.   Review of Systems  Constitutional: Negative for unusual diaphoresis or night sweats HENT: Negative for ear swelling or discharge Eyes: Negative for worsening visual haziness  Respiratory: Negative for choking and stridor.   Gastrointestinal: Negative for distension or worsening eructation Genitourinary: Negative for retention or change in urine volume.  Musculoskeletal: Negative for other MSK pain or swelling Skin: Negative for color change and worsening wound Neurological: Negative for tremors and numbness other than noted  Psychiatric/Behavioral: Negative for decreased concentration or agitation other than above       Objective:   Physical Exam BP 120/76 mmHg  Pulse 66  Resp 20  Wt 132 lb (59.875 kg)  SpO2 96% VS noted,  Constitutional: Pt appears in no apparent distress HENT: Head: NCAT.  Right Ear: External ear normal.  Left Ear: External ear normal.  Bilat tm's with mild erythema.  Max sinus areas non tender.  Pharynx with mild erythema, no exudate Eyes: . Pupils are equal, round, and reactive to light. Conjunctivae and EOM are normal Neck: Normal range of motion. Neck supple.  Cardiovascular: Normal rate and regular rhythm.   Pulmonary/Chest: Effort normal and breath sounds without rales or wheezing.  Abd:  Soft, NT, ND, + BS Neurological: Pt is alert. At baseline confused , motor grossly intact Skin: Skin is warm. No rash, no LE edema Psychiatric: Pt behavior is normal. No agitation.       Assessment & Plan:

## 2016-03-20 NOTE — Patient Instructions (Addendum)
Ok to stop the aricept  Please take all new medication as prescribed - the namzaric  OK to increase the protonix to 40 mg per day  You can also take OTC allegra and nasacort, and Mucinex (or it's generic off brand) for congestion, and tylenol as needed for pain. (and even Zaditor for eyes if needed)  Please continue all other medications as before, and refills have been done if requested.  Please have the pharmacy call with any other refills you may need.  Please keep your appointments with your specialists as you may have planned  Please return in 6 months, or sooner if needed, with Lab testing done 3-5 days before

## 2016-03-24 NOTE — Assessment & Plan Note (Signed)
With mild breakthrough symptoms, for increased protonix 40 qd,  to f/u any worsening symptoms or concerns

## 2016-03-24 NOTE — Assessment & Plan Note (Signed)
With mild worsening, ok for change aricept to namzaric asd,  to f/u any worsening symptoms or concerns

## 2016-03-24 NOTE — Assessment & Plan Note (Signed)
Mild to mod, for allegra/nasacort asd,  to f/u any worsening symptoms or concerns

## 2016-04-04 ENCOUNTER — Emergency Department (HOSPITAL_COMMUNITY): Payer: Medicare Other

## 2016-04-04 ENCOUNTER — Emergency Department (HOSPITAL_COMMUNITY)
Admission: EM | Admit: 2016-04-04 | Discharge: 2016-04-04 | Disposition: A | Discharge status: Home / Self Care | Payer: Medicare Other | Attending: Emergency Medicine | Admitting: Emergency Medicine

## 2016-04-04 ENCOUNTER — Telehealth: Payer: Self-pay

## 2016-04-04 ENCOUNTER — Encounter (HOSPITAL_COMMUNITY): Payer: Self-pay | Admitting: Emergency Medicine

## 2016-04-04 ENCOUNTER — Telehealth: Payer: Self-pay | Admitting: Internal Medicine

## 2016-04-04 DIAGNOSIS — Z8546 Personal history of malignant neoplasm of prostate: Secondary | ICD-10-CM | POA: Diagnosis not present

## 2016-04-04 DIAGNOSIS — I252 Old myocardial infarction: Secondary | ICD-10-CM | POA: Insufficient documentation

## 2016-04-04 DIAGNOSIS — I10 Essential (primary) hypertension: Secondary | ICD-10-CM | POA: Diagnosis not present

## 2016-04-04 DIAGNOSIS — I2510 Atherosclerotic heart disease of native coronary artery without angina pectoris: Secondary | ICD-10-CM | POA: Insufficient documentation

## 2016-04-04 DIAGNOSIS — J189 Pneumonia, unspecified organism: Secondary | ICD-10-CM | POA: Diagnosis not present

## 2016-04-04 DIAGNOSIS — Z87891 Personal history of nicotine dependence: Secondary | ICD-10-CM | POA: Diagnosis not present

## 2016-04-04 DIAGNOSIS — Z79899 Other long term (current) drug therapy: Secondary | ICD-10-CM | POA: Insufficient documentation

## 2016-04-04 DIAGNOSIS — Z7982 Long term (current) use of aspirin: Secondary | ICD-10-CM | POA: Insufficient documentation

## 2016-04-04 DIAGNOSIS — M5136 Other intervertebral disc degeneration, lumbar region: Secondary | ICD-10-CM | POA: Insufficient documentation

## 2016-04-04 DIAGNOSIS — R109 Unspecified abdominal pain: Secondary | ICD-10-CM | POA: Diagnosis present

## 2016-04-04 DIAGNOSIS — E785 Hyperlipidemia, unspecified: Secondary | ICD-10-CM | POA: Diagnosis not present

## 2016-04-04 LAB — URINALYSIS, ROUTINE W REFLEX MICROSCOPIC
Glucose, UA: NEGATIVE mg/dL
Ketones, ur: 15 mg/dL — AB
Leukocytes, UA: NEGATIVE
Nitrite: NEGATIVE
PH: 6 (ref 5.0–8.0)
Protein, ur: 30 mg/dL — AB
SPECIFIC GRAVITY, URINE: 1.026 (ref 1.005–1.030)

## 2016-04-04 LAB — COMPREHENSIVE METABOLIC PANEL
ALBUMIN: 3.6 g/dL (ref 3.5–5.0)
ALK PHOS: 89 U/L (ref 38–126)
ALT: 17 U/L (ref 17–63)
ANION GAP: 8 (ref 5–15)
AST: 33 U/L (ref 15–41)
BILIRUBIN TOTAL: 1.3 mg/dL — AB (ref 0.3–1.2)
BUN: 21 mg/dL — AB (ref 6–20)
CALCIUM: 9 mg/dL (ref 8.9–10.3)
CO2: 29 mmol/L (ref 22–32)
Chloride: 100 mmol/L — ABNORMAL LOW (ref 101–111)
Creatinine, Ser: 1.15 mg/dL (ref 0.61–1.24)
GFR calc Af Amer: >60 mL/min (ref >60)
GFR, EST NON AFRICAN AMERICAN: 53 mL/min — AB (ref >60)
GLUCOSE: 108 mg/dL — AB (ref 65–99)
POTASSIUM: 4 mmol/L (ref 3.5–5.1)
Sodium: 137 mmol/L (ref 135–145)
TOTAL PROTEIN: 7.5 g/dL (ref 6.5–8.1)

## 2016-04-04 LAB — CBC
HCT: 44.1 % (ref 39.0–52.0)
HEMOGLOBIN: 14.6 g/dL (ref 13.0–17.0)
MCH: 32.7 pg (ref 26.0–34.0)
MCHC: 33.1 g/dL (ref 30.0–36.0)
MCV: 98.9 fL (ref 78.0–100.0)
Platelets: 186 10*3/uL (ref 150–400)
RBC: 4.46 MIL/uL (ref 4.22–5.81)
RDW: 14.4 % (ref 11.5–15.5)
WBC: 8.4 10*3/uL (ref 4.0–10.5)

## 2016-04-04 LAB — URINE MICROSCOPIC-ADD ON

## 2016-04-04 LAB — TROPONIN I: Troponin I: <0.03 ng/mL (ref <0.03)

## 2016-04-04 LAB — LIPASE, BLOOD: Lipase: 13 U/L (ref 11–51)

## 2016-04-04 MED ORDER — LEVOFLOXACIN 750 MG PO TABS
750.0000 mg | ORAL_TABLET | Freq: Once | ORAL | Status: AC
  Administered 2016-04-04: 750 mg via ORAL
  Filled 2016-04-04: qty 1

## 2016-04-04 MED ORDER — LEVOFLOXACIN 500 MG PO TABS: 500.0000 mg | ORAL_TABLET | Freq: Every day | ORAL | Status: DC

## 2016-04-04 MED ORDER — NAPROXEN 375 MG PO TABS: 375.0000 mg | ORAL_TABLET | Freq: Two times a day (BID) | ORAL | Status: DC

## 2016-04-04 NOTE — ED Notes (Signed)
Pt reports R side abd pain for the past few year, but became much worse over the past 2 days. Pain is mostly in the RLQ, but does radiate up to the RUQ at times. Pt recently started a new medication and had been exerting himself prior to pain starting. Only 1 episode of diarrhea, No emesis. No dysuria.

## 2016-04-04 NOTE — Telephone Encounter (Signed)
Patient is in hospital

## 2016-04-04 NOTE — Telephone Encounter (Signed)
FYI: Just wanted to let us know that the patient is at the hospital.

## 2016-04-04 NOTE — ED Provider Notes (Signed)
CSN: [id number]     Arrival date & time 04/04/16  1423 History   First MD Initiated Contact with Patient 04/04/16 1850     Chief Complaint  Patient presents with  . Abdominal Pain      HPI  Patient presents evaluation of some right-sided chest and upper abdominal pain.  Symptoms for the last 4 days. States he notices it most when he is up and around or when he coughs. He has not had much of a spontaneous cough. Minimal sputum. No blood. No shingles or vesicles. Good appetite eating well. Normal bowel habits no diarrhea no dysuria.  Past Medical History  Diagnosis Date  . Hypertension   . MI (myocardial infarction) ([place])   . Gastrointestinal stromal tumor (GIST) of stomach 05/2009, 10/2009    2010: gastric ulcer with GIST: IR embolization left gastric artery.  2011: partial gastrectomy  . CAD (coronary artery disease) 05/2012  . HTN (hypertension) 03/29/2011  . Hyperlipidemia 03/29/2011  . BPH (benign prostatic hypertrophy) 03/29/2011  . GERD (gastroesophageal reflux disease) 03/29/2011  . S/P cholecystectomy 03/29/2011  . 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 Allergic rhinitis, cause unspecified 03/29/2011  . Anemia 05/2009    blood loss anemia, transfused 6 units.   . Arthritis   . Urine incontinence   . Low back pain   . DJD (degenerative joint disease), lumbar 04/01/2011  . Prostate cancer ([place]) 03/29/2011  . History of nuclear stress test 03/27/2010    exercise; no ischemic changes, normal study   . Colon polyp 05/2012    cecal, ascending tubular adenomas.    Past Surgical History  Procedure Laterality Date  . Coronary angioplasty with stent placement  04/24/2009    2 Xience DES to LAD 3.5x33mm and 3.0x70mm overlapping  . Tonsillectomy  1951  . Cholecystectomy  1986  . Partial gastrectomy  10/2009    GIST  . Transthoracic echocardiogram  10/09/2009    EF 55-60%; normaml LV systolic function with grade 1 diastolic dysfunction  . Gastric artery  emoblization Left 05/2009   Family History  Problem Relation Age of Onset  . Arthritis Mother   . Ovarian cancer Mother   . Heart failure Mother   . Arthritis Father   . Lung cancer Father   . Colon cancer Neg Hx    Social History  Substance Use Topics  . Smoking status: Former Smoker    Quit date: 10/07/1960  . Smokeless tobacco: Never Used     Comment: Stopped smoking in 1962  . Alcohol Use: 0.6 oz/week    1 Glasses of wine per week     Comment: very rare    Review of Systems  Constitutional: Negative for fever, chills, diaphoresis, appetite change and fatigue.  HENT: Negative for mouth sores, sore throat and trouble swallowing.   Eyes: Negative for visual disturbance.  Respiratory: Negative for cough, chest tightness, shortness of breath and wheezing.   Cardiovascular: Positive for chest pain.  Gastrointestinal: Negative for nausea, vomiting, abdominal pain, diarrhea and abdominal distention.  Endocrine: Negative for polydipsia, polyphagia and polyuria.  Genitourinary: Negative for dysuria, frequency and hematuria.  Musculoskeletal: Negative for gait problem.  Skin: Negative for color change, pallor and rash.  Neurological: Negative for dizziness, syncope, light-headedness and headaches.  Hematological: Does not bruise/bleed easily.  Psychiatric/Behavioral: Negative for behavioral problems and confusion.      Allergies  Review of patient's allergies indicates no known allergies.  Home Medications   Prior to Admission  medications   Medication Sig Start Date End Date Taking? Authorizing Provider  aspirin 81 MG EC tablet Take 1 tablet (81 mg total) by mouth daily. 05/04/13   Emina Riebock, NP  cetirizine (ZYRTEC) 10 MG tablet Take 1 tablet (10 mg total) by mouth daily. 02/01/15   Biagio Borg, MD  donepezil (ARICEPT) 5 MG tablet Take 1 tablet (5 mg total) by mouth at bedtime. 01/12/16   Biagio Borg, MD  lidocaine (XYLOCAINE) 5 % ointment Apply 1 application topically 2  (two) times daily as needed. Right lower ribs 07/24/15   Charlett Blake, MD  lisinopril (PRINIVIL,ZESTRIL) 5 MG tablet Take 1 tablet (5 mg total) by mouth daily. 11/11/14   Biagio Borg, MD  Memantine HCl-Donepezil HCl [facility]) 28-10 MG CP24 Take 1 tablet by mouth daily. 03/20/16   Biagio Borg, MD  ondansetron (ZOFRAN) 8 MG tablet Take 1 tablet (8 mg total) by mouth every 8 (eight) hours as needed for nausea or vomiting. 04/03/15   Daleen Bo, MD  pantoprazole (PROTONIX) 40 MG tablet Take 1 tablet (40 mg total) by mouth daily. 03/20/16   Biagio Borg, MD  senna (SENOKOT) 8.6 MG tablet Take 1 tablet by mouth 2 (two) times daily.    Historical Provider, MD  traMADol (ULTRAM) 50 MG tablet Take 1 tablet (50 mg total) by mouth 3 (three) times daily. 10/12/15   Charlett Blake, MD  triamcinolone (NASACORT AQ) 55 MCG/ACT AERO nasal inhaler Place 2 sprays into the nose daily. 02/01/15   Biagio Borg, MD   BP 101/76 mmHg  Pulse 90  Temp(Src) 98.8 F (37.1 C) (Oral)  Resp 18  Ht 5\' 8"  (1.727 m)  Wt 134 lb (60.782 kg)  BMI 20.38 kg/m2  SpO2 95% Physical Exam  Constitutional: He is oriented to person, place, and time. He appears well-developed and well-nourished. No distress.  Awake and alert. Oriented lucid. Offers most of his own history. His daughter is here and Augmentin his history somewhat.  HENT:  Head: Normocephalic.  Eyes: Conjunctivae are normal. Pupils are equal, round, and reactive to light. No scleral icterus.  Neck: Normal range of motion. Neck supple. No thyromegaly present.  Cardiovascular: Normal rate and regular rhythm.  Exam reveals no gallop and no friction rub.   No murmur heard. Pulmonary/Chest: Effort normal and breath sounds normal. No respiratory distress. He has no wheezes. He has no rales.    Abdominal: Soft. Bowel sounds are normal. He exhibits no distension. There is no tenderness. There is no rebound.  Musculoskeletal: Normal range of motion.  Neurological: He  is alert and oriented to person, place, and time.  Skin: Skin is warm and dry. No rash noted.  Psychiatric: He has a normal mood and affect. His behavior is normal.    ED Course  Procedures (including critical care time) Labs Review Labs Reviewed  COMPREHENSIVE METABOLIC PANEL - Abnormal; Notable for the following:    Chloride 100 (*)    Glucose, Bld 108 (*)    BUN 21 (*)    Total Bilirubin 1.3 (*)    GFR calc non Af Amer 53 (*)    All other components within normal limits  URINALYSIS, ROUTINE W REFLEX MICROSCOPIC (NOT AT [facility]) - Abnormal; Notable for the following:    Color, Urine AMBER (*)    APPearance CLOUDY (*)    Hgb urine dipstick MODERATE (*)    Bilirubin Urine SMALL (*)    Ketones, ur 15 (*)  Protein, ur 30 (*)    All other components within normal limits  URINE MICROSCOPIC-ADD ON - Abnormal; Notable for the following:    Squamous Epithelial / LPF 0-5 (*)    Bacteria, UA RARE (*)    All other components within normal limits  LIPASE, BLOOD  CBC  TROPONIN I    Imaging Review Dg Chest 2 View  04/04/2016  CLINICAL DATA:  Right lower chest pain for 3 days EXAM: CHEST  2 VIEW COMPARISON:  March 10, 2015 FINDINGS: There is patchy infiltrate in the right lower lobe. There is slight scarring in the left base. Elsewhere lungs are clear. Heart size and pulmonary vascularity are normal. No adenopathy. No bone lesions. IMPRESSION: Patchy right lower lobe infiltrate. Slight scarring left base. Lungs elsewhere clear. Stable cardiac silhouette. Followup PA and lateral chest radiographs recommended in 3-4 weeks following trial of antibiotic therapy to ensure resolution and exclude underlying malignancy. Electronically Signed   By: Lowella Grip III M.D.   On: 04/04/2016 21:04   I have personally reviewed and evaluated these images and lab results as part of my medical decision-making.   EKG Interpretation   Date/Time:  Thursday April 04 2016 19:46:14 EDT Ventricular Rate:   88 PR Interval:    QRS Duration: 79 QT Interval:  337 QTC Calculation: 408 R Axis:   62 Text Interpretation:  Sinus tachycardia Multiple premature complexes, vent  & supraven Confirmed by Jeneen Rinks  MD, Whitney ([id number]) on 04/04/2016 9:23:48 PM      MDM   Final diagnoses:  CAP (community acquired pneumonia)    No fever. Patient not hypoxemic. Not tachycardic. Is not short of breath. Has some slight hematuria but he is otherwise asymptomatic. No irritant voiding symptoms no frequency. Chest x-ray shows right lower lobe subtle. Process. His abdomen around the department short distances. Essentially in the room. No desaturations. Given the Levaquin. Appropriate for discharge home. Perception for Levaquin. Naproxen for pain primary care follow-up. ER with any changes.    Tanna Furry, MD 04/04/16 2150

## 2016-04-04 NOTE — Telephone Encounter (Signed)
Patient Name: WALTON POOLER  DOB: [birth year]    Initial Comment Caller states father has not been well for 2 days. Today having fever and pain in lower right abdomen.   Nurse Assessment  Nurse: Raphael Gibney, RN, Vanita Ingles Date/Time (Eastern Time): 04/04/2016 10:26:43 AM  Confirm and document reason for call. If symptomatic, describe symptoms. You must click the next button to save text entered. ---Caller states father has not felt well for 2 days. he is having pain in his lower right abd. Pain is severe at times. Pain is intermittent.  Has the patient traveled out of the country within the last 30 days? ---Not Applicable  Does the patient have any new or worsening symptoms? ---Yes  Will a triage be completed? ---Yes  Related visit to physician within the last 2 weeks? ---No  Does the PT have any chronic conditions? (i.e. diabetes, asthma, etc.) ---Yes  List chronic conditions. ---back problems, MI, cardiac stent  Is this a behavioral health or substance abuse call? ---No     Guidelines    Guideline Title Affirmed Question Affirmed Notes  Abdominal Pain - Male [1] MILD-MODERATE pain AND [2] constant AND [3] present > 2 hours    Final Disposition User   See Physician within 4 Hours (or PCP triage) Raphael Gibney, RN, Vera    Comments  No appts available at [facility] within 4 hrs. Pt does not want to go to urgent care or the ER. Please call pt back regarding appt.   Referrals  GO TO FACILITY REFUSED   Disagree/Comply: Disagree  Disagree/Comply Reason: Disagree with instructions

## 2016-04-04 NOTE — Telephone Encounter (Signed)
Please advise 

## 2016-04-04 NOTE — Discharge Instructions (Signed)

## 2016-04-04 NOTE — Telephone Encounter (Signed)
We are unable to adequately evaluate this patient for RLQ pain in this office, as besides lab testing, he likely will need emergent CT scan abd/pelvis  Pt should go to ER now

## 2016-04-10 ENCOUNTER — Telehealth: Payer: Self-pay | Admitting: Internal Medicine

## 2016-04-10 NOTE — Telephone Encounter (Signed)
Patient Name: Eric Cooley  DOB: 08-Oct-1919    Initial Comment father complaining that his ribs are hurting no other symptoms that she knows of at the moment    Nurse Assessment  Nurse: Mallie Mussel, RN, Alveta Heimlich Date/Time Eilene Ghazi Time): 04/10/2016 11:10:59 AM  Confirm and document reason for call. If symptomatic, describe symptoms. You must click the next button to save text entered. ---Caller states that her father was diagnosed with pneumonia on 04/04/16. He has bee n on Levaquin. He called her today and told her that he has rib pain. Denies fever, coughing, and chills. She is not with her father. He is able to walk. He walked to her house which is 4-5 miles from his house. She is wanting a follow up appointment for him for today if possible. The rib pain is not new or different. He had it when he was diagnosed.  Has the patient traveled out of the country within the last 30 days? ---Not Applicable  Does the patient have any new or worsening symptoms? ---No     Guidelines    Guideline Title Affirmed Question Affirmed Notes       Final Disposition User   Attempt made - message left Mallie Mussel, RN, Alveta Heimlich    Comments  Appointment scheduled for tomorrow evening at 6:00pm with Dr. Cathlean Cower.  Caller states that if he gets worse, she will take him back to ER.

## 2016-04-11 ENCOUNTER — Ambulatory Visit (INDEPENDENT_AMBULATORY_CARE_PROVIDER_SITE_OTHER)
Admission: RE | Admit: 2016-04-11 | Discharge: 2016-04-11 | Disposition: A | Discharge status: Home / Self Care | Payer: Medicare Other | Source: Ambulatory Visit | Attending: Internal Medicine | Admitting: Internal Medicine

## 2016-04-11 ENCOUNTER — Encounter: Payer: Self-pay | Admitting: Internal Medicine

## 2016-04-11 ENCOUNTER — Ambulatory Visit (INDEPENDENT_AMBULATORY_CARE_PROVIDER_SITE_OTHER): Payer: Medicare Other | Admitting: Internal Medicine

## 2016-04-11 ENCOUNTER — Telehealth: Payer: Self-pay

## 2016-04-11 VITALS — BP 128/78 | HR 77 | Temp 97.8°F | Resp 20 | Wt 129.0 lb

## 2016-04-11 DIAGNOSIS — R0789 Other chest pain: Secondary | ICD-10-CM

## 2016-04-11 DIAGNOSIS — J189 Pneumonia, unspecified organism: Secondary | ICD-10-CM | POA: Diagnosis not present

## 2016-04-11 DIAGNOSIS — I10 Essential (primary) hypertension: Secondary | ICD-10-CM

## 2016-04-11 DIAGNOSIS — M549 Dorsalgia, unspecified: Secondary | ICD-10-CM | POA: Diagnosis not present

## 2016-04-11 DIAGNOSIS — G8929 Other chronic pain: Secondary | ICD-10-CM

## 2016-04-11 DIAGNOSIS — R300 Dysuria: Secondary | ICD-10-CM

## 2016-04-11 LAB — POCT URINALYSIS DIPSTICK
Bilirubin, UA: NEGATIVE
Blood, UA: NEGATIVE
Glucose, UA: NEGATIVE
KETONES UA: NEGATIVE
LEUKOCYTES UA: NEGATIVE
NITRITE UA: NEGATIVE
PH UA: 6
PROTEIN UA: NEGATIVE
Spec Grav, UA: 1.025
UROBILINOGEN UA: NEGATIVE

## 2016-04-11 MED ORDER — METRONIDAZOLE 250 MG PO TABS: 250.0000 mg | ORAL_TABLET | Freq: Three times a day (TID) | ORAL | Status: DC

## 2016-04-11 NOTE — Patient Instructions (Addendum)
Your urine test in the office was [facility] to finish the current antibiotic  Please take all new medication as prescribed - the additional antibiotic for 10 days  You had the CXR today  You will be contacted by phone if any changes need to be made immediately.  Otherwise, you will receive a letter about your results with an explanation, but please check with MyChart first.  OK to HOLD on taking the meloxicam for pain  Please continue all other medications as before, including the tramadol  Please have the pharmacy call with any other refills you may need.  Please keep your appointments with your specialists as you may have planned  Please remember to sign up for MyChart if you have not done so, as this will be important to you in the future with finding out test results, communicating by private email, and scheduling acute appointments online when needed.

## 2016-04-11 NOTE — Assessment & Plan Note (Signed)
?   CAP vs aspirate related to dementia, initially improved by hx, now worsening, to add flagyl tid, finish levaquin, f/u cxr results, conside pulm referral or CT,  to f/u any worsening symptoms or concerns

## 2016-04-11 NOTE — Assessment & Plan Note (Signed)
stable overall by history and exam, recent data reviewed with pt, and pt to continue medical treatment as before,  to f/u any worsening symptoms or concerns BP Readings from Last 3 Encounters:  04/11/16 128/78  04/04/16 132/69  03/20/16 120/76

## 2016-04-11 NOTE — Progress Notes (Addendum)
Subjective:    Patient ID: Eric Cooley, male    DOB: 08-07-1920, 82 y.o.   MRN: [id number]  HPI  Here to f/u with daughter who helps with hx as pt feeling poorly, recent onset/dx CAP RLL 6/29 at ED visit; seemed overall to improve with stamina, sense of well being, right sided pain, and scant prod cough for the first 4 days, but seems to be getting worse in last 3 days again, despite good compliance with med, has only 3 days left pills to take.  Pt states increasing pain to right side again, and feeling ill, more weakness again with only small cough, but no increased sob, wheezing, fever or chills. Has hx of dementia, but has no specific hx of aspiration.  Also c/o persistent LBP  - Pt continues to have recurring LBP without change in severity,and no bowel or bladder change, fever, wt loss,  worsening LE pain/numbness/weakness, gait change or falls.  Did not take mobic as a nurse family member warned him off due to hx of bleeding ulcers.  Pt remains confused, ? Some worse with illness.  Pt had CXR today just prior to this visit, results pending. Denies worsening reflux, abd pain, dysphagia, n/v, bowel change or blood.  Denies urinary symptoms such as dysuria, frequency, urgency, flank pain, hematuria or n/v, fever, chills. Past Medical History  Diagnosis Date  . Hypertension   . MI (myocardial infarction) ([place])   . Gastrointestinal stromal tumor (GIST) of stomach 05/2009, 10/2009    2010: gastric ulcer with GIST: IR embolization left gastric artery.  2011: partial gastrectomy  . CAD (coronary artery disease) 05/2012  . HTN (hypertension) 03/29/2011  . Hyperlipidemia 03/29/2011  . BPH (benign prostatic hypertrophy) 03/29/2011  . GERD (gastroesophageal reflux disease) 03/29/2011  . S/P cholecystectomy 03/29/2011  . 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 Allergic rhinitis, cause unspecified 03/29/2011  . Anemia 05/2009    blood loss anemia, transfused 6 units.   .  Arthritis   . Urine incontinence   . Low back pain   . DJD (degenerative joint disease), lumbar 04/01/2011  . Prostate cancer ([place]) 03/29/2011  . History of nuclear stress test 03/27/2010    exercise; no ischemic changes, normal study   . Colon polyp 05/2012    cecal, ascending tubular adenomas.    Past Surgical History  Procedure Laterality Date  . Coronary angioplasty with stent placement  04/24/2009    2 Xience DES to LAD 3.5x7mm and 3.0x60mm overlapping  . Tonsillectomy  1951  . Cholecystectomy  1986  . Partial gastrectomy  10/2009    GIST  . Transthoracic echocardiogram  10/09/2009    EF 55-60%; normaml LV systolic function with grade 1 diastolic dysfunction  . Gastric artery emoblization Left 05/2009    reports that he quit smoking about 55 years ago. He has never used smokeless tobacco. He reports that he drinks about 0.6 oz of alcohol per week. He reports that he does not use illicit drugs. family history includes Arthritis in his father and mother; Heart failure in his mother; Lung cancer in his father; Ovarian cancer in his mother. There is no history of Colon cancer. No Known Allergies Current Outpatient Prescriptions on File Prior to Visit  Medication Sig Dispense Refill  . aspirin 81 MG EC tablet Take 1 tablet (81 mg total) by mouth daily. 30 tablet 12  . cetirizine (ZYRTEC) 10 MG tablet Take 1 tablet (10 mg total) by mouth daily. [place]  tablet 11  . donepezil (ARICEPT) 5 MG tablet Take 1 tablet (5 mg total) by mouth at bedtime. 90 tablet 3  . levofloxacin (LEVAQUIN) 500 MG tablet Take 1 tablet (500 mg total) by mouth daily. 10 tablet 0  . lidocaine (XYLOCAINE) 5 % ointment Apply 1 application topically 2 (two) times daily as needed. Right lower ribs 35.44 g 0  . lisinopril (PRINIVIL,ZESTRIL) 5 MG tablet Take 1 tablet (5 mg total) by mouth daily. 90 tablet 3  . Memantine HCl-Donepezil HCl (NAMZARIC) 28-10 MG CP24 Take 1 tablet by mouth daily. 90 capsule 3  . naproxen (NAPROSYN) 375  MG tablet Take 1 tablet (375 mg total) by mouth 2 (two) times daily. 10 tablet 0  . ondansetron (ZOFRAN) 8 MG tablet Take 1 tablet (8 mg total) by mouth every 8 (eight) hours as needed for nausea or vomiting. 20 tablet 0  . pantoprazole (PROTONIX) 40 MG tablet Take 1 tablet (40 mg total) by mouth daily. 90 tablet 3  . traMADol (ULTRAM) 50 MG tablet Take 1 tablet (50 mg total) by mouth 3 (three) times daily. 90 tablet 5  . triamcinolone (NASACORT AQ) 55 MCG/ACT AERO nasal inhaler Place 2 sprays into the nose daily. 1 Inhaler 12  . [DISCONTINUED] fluticasone (FLONASE) 50 MCG/ACT nasal spray Place 2 sprays into the nose daily.       No current facility-administered medications on file prior to visit.   Review of Systems  Constitutional: Negative for unusual diaphoresis or night sweats HENT: Negative for ear swelling or discharge Eyes: Negative for worsening visual haziness  Respiratory: Negative for choking and stridor.   Gastrointestinal: Negative for distension or worsening eructation Genitourinary: Negative for retention or change in urine volume.  Musculoskeletal: Negative for other MSK pain or swelling Skin: Negative for color change and worsening wound Neurological: Negative for tremors and numbness other than noted  Psychiatric/Behavioral: Negative for decreased concentration or agitation other than above       Objective:   Physical Exam BP 128/78 mmHg  Pulse 77  Temp(Src) 97.8 F (36.6 C) (Oral)  Resp 20  Wt 129 lb (58.514 kg)  SpO2 92% VS noted, weaker, thin, some cognitively slower, gets up on exam table slowly but without assist, appears mild to mod ill Constitutional: Pt appears in no apparent distress HENT: Head: NCAT.  Right Ear: External ear normal.  Left Ear: External ear normal.  Eyes: . Pupils are equal, round, and reactive to light. Conjunctivae and EOM are normal Neck: Normal range of motion. Neck supple.  Cardiovascular: Normal rate and regular rhythm.     Pulmonary/Chest: Effort normal and breath sounds decreased bilat right > left, without rales or wheezing. or rhonchi; no specific rib or chest wall tender to right lateral chest, no rash, redness or swelling Abd:  Soft, NT, ND, + BS, no flank tender Spine nontender, has mild right low lumbar paravertebral tender spasm Neurological: Pt is alert. mild confused , motor grossly intact Skin: Skin is warm. No rash, no LE edema Psychiatric: Pt behavior is normal. No agitation.   Most recent CXR: IMPRESSION: Patchy right lower lobe infiltrate. Slight scarring left base. Lungs elsewhere clear. Stable cardiac silhouette.  Followup PA and lateral chest radiographs recommended in 3-4 weeks following trial of antibiotic therapy to ensure resolution and exclude underlying malignancy.   Electronically Signed  By: Lowella Grip III M.D.  On: 04/04/2016 21:04 POCT Urinalysis Dipstick  Status: Finalresult Visible to patient:  Not Released Dx:  Dysuria  Normal           Ref Range 6:39 PM  7d ago  74mo ago  66yr ago     Color, UA  yellow       Clarity, UA  cloudy       Glucose, UA  negative NEGATIVE  NEGATIVE    Bilirubin, UA  negative       Ketones, UA  negative       Spec Grav, UA  1.025       Blood, UA  negative       pH, UA  6.0       Protein, UA  negative       Urobilinogen, UA  negative  0.2 0.2    Nitrite, UA  negative       Leukocytes, UA Negative  Negative               Assessment & Plan:

## 2016-04-11 NOTE — Telephone Encounter (Signed)
Order placed

## 2016-04-11 NOTE — Assessment & Plan Note (Signed)
?   Worse, spine nontender, Udip neg, cont tramadol prn,  to f/u any worsening symptoms or concerns

## 2016-04-11 NOTE — Progress Notes (Signed)
Pre visit review using our clinic review tool, if applicable. No additional management support is needed unless otherwise documented below in the visit note. 

## 2016-04-12 ENCOUNTER — Telehealth: Payer: Self-pay | Admitting: Internal Medicine

## 2016-04-12 NOTE — Telephone Encounter (Signed)
CXR results noted this am of worsening Right pleural effusion, in the setting of worsening pain (but no fever, sob, wheezing)  D/w Dr Letitia Caul who personally reviewed films.  Although pt is 25 and likely worsening pleural effusion best treated as inpatient, the size of his effusion, though larger, is still not likely to be confidently amenable to even u/s guided thoracentesis  Recommendation was to cont antibx, and to ER if worsening fever, pain, sob or other  I d/w daughter by phone who understands and agrees

## 2016-04-15 ENCOUNTER — Encounter: Payer: Self-pay | Admitting: Internal Medicine

## 2016-04-15 ENCOUNTER — Ambulatory Visit (INDEPENDENT_AMBULATORY_CARE_PROVIDER_SITE_OTHER)
Admission: RE | Admit: 2016-04-15 | Discharge: 2016-04-15 | Disposition: A | Discharge status: Home / Self Care | Payer: Medicare Other | Source: Ambulatory Visit | Attending: Internal Medicine | Admitting: Internal Medicine

## 2016-04-15 ENCOUNTER — Ambulatory Visit (INDEPENDENT_AMBULATORY_CARE_PROVIDER_SITE_OTHER): Payer: Medicare Other | Admitting: Internal Medicine

## 2016-04-15 VITALS — BP 128/64 | HR 74 | Temp 97.6°F | Resp 18 | Wt 129.0 lb

## 2016-04-15 DIAGNOSIS — J189 Pneumonia, unspecified organism: Secondary | ICD-10-CM | POA: Diagnosis not present

## 2016-04-15 NOTE — Progress Notes (Signed)
Pre visit review using our clinic review tool, if applicable. No additional management support is needed unless otherwise documented below in the visit note. 

## 2016-04-15 NOTE — Assessment & Plan Note (Addendum)
There is clinical improvement - his daughter says he looks better and has not been complaining of the chest pain.  He feels slightly better No change in therapy needed Continue and finish flagyl Repeat cxr today Follow up per Dr. Gwynn Burly recommendations

## 2016-04-15 NOTE — Progress Notes (Signed)
Subjective:    Patient ID: Eric Cooley, male    DOB: [birth year], 82 y.o.   MRN: [id number]  HPI He is here for follow up for pneumonia.  He was in the ED 6/29 for right sided chest and abdominal pain.  He was diagnosed with CAP and was started on Levaquin.  He followed up with Dr Jenny Reichmann 7/6. He had initially gotten better with the levaquin, but then started to feel worse again.  His right sided chest pain started to increase and he overall felt worse, with increased fatigue, weakness, mild cough. A chest xray showed increased effusion.  Flagyl was added and he is here today for follow up. His daughter is with him.  He feels slightly better.  His chest pain is slightly better.  His appetite is good.  He does not feel as sick and his daughter agrees that he looks better and has not been complaining of the chest pain over the weekend.   He denies fever, cough and sob.  There is no wheeze, abdominal pain, headache or lightheadedness.        Medications and allergies reviewed with patient and updated if appropriate.  Patient Active Problem List   Diagnosis Date Noted  . CAP (community acquired pneumonia) 04/11/2016  . 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 Right flank pain, chronic 06/08/2015  . 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 Thoracic scoliosis 03/30/2015  . Facet syndrome, lumbar 03/30/2015  . Myofascial pain on right side 03/30/2015  . Mild dementia 03/14/2015  . Abnormal LFTs   . Chest pain 03/10/2015  . Left shoulder pain 02/01/2015  . Pleuritic chest pain 02/01/2015  . Abnormal CXR 12/16/2014  . Chronic back pain 12/16/2014  . Unsteady gait 12/02/2014  . Memory loss 07/19/2013  . Loss of weight 07/16/2013  . Chest pain with low risk for cardiac etiology 07/12/2013  . Protein-calorie malnutrition, severe ([place]) 07/12/2013  . Subacute confusional state 05/28/2013  . Gait disorder 05/28/2013  . Subarachnoid hemorrhage following  injury ([place]) 04/16/2013  . Shoulder dislocation-right 04/16/2013  . Intermittent low back pain 04/15/2012  . Constipation 03/04/2012  . Dysuria 03/04/2012  . Pneumonia 01/29/2012  . Dizziness 12/18/2011  . Balance disorder 12/18/2011  . Pre-ulcerative corn or callous 10/13/2011  . DJD (degenerative joint disease), lumbar 04/01/2011  . CAD (coronary artery disease) 03/29/2011  . Prostate cancer ([place]) 03/29/2011  . 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 HTN (hypertension) 03/29/2011  . Hyperlipidemia 03/29/2011  . BPH (benign prostatic hypertrophy) 03/29/2011  . GERD (gastroesophageal reflux disease) 03/29/2011  . Preventative health care 03/29/2011  . S/P cholecystectomy 03/29/2011  . Allergic rhinitis 03/29/2011  . S/P partial gastrectomy 03/29/2011  . Anemia 03/29/2011    Current Outpatient Prescriptions on File Prior to Visit  Medication Sig Dispense Refill  . aspirin 81 MG EC tablet Take 1 tablet (81 mg total) by mouth daily. 30 tablet 12  . cetirizine (ZYRTEC) 10 MG tablet Take 1 tablet (10 mg total) by mouth daily. 30 tablet 11  . donepezil (ARICEPT) 5 MG tablet Take 1 tablet (5 mg total) by mouth at bedtime. 90 tablet 3  . levofloxacin (LEVAQUIN) 500 MG tablet Take 1 tablet (500 mg total) by mouth daily. 10 tablet 0  . lidocaine (XYLOCAINE) 5 % ointment Apply 1 application topically 2 (two) times daily as needed. Right lower ribs 35.44 g 0  . lisinopril (PRINIVIL,ZESTRIL) 5 MG tablet Take 1 tablet (5 mg  total) by mouth daily. 90 tablet 3  . Memantine HCl-Donepezil HCl (NAMZARIC) 28-10 MG CP24 Take 1 tablet by mouth daily. 90 capsule 3  . metroNIDAZOLE (FLAGYL) 250 MG tablet Take 1 tablet (250 mg total) by mouth 3 (three) times daily. 30 tablet 0  . naproxen (NAPROSYN) 375 MG tablet Take 1 tablet (375 mg total) by mouth 2 (two) times daily. 10 tablet 0  . ondansetron (ZOFRAN) 8 MG tablet Take 1 tablet (8 mg total) by mouth every 8 (eight) hours as needed for nausea or  vomiting. 20 tablet 0  . pantoprazole (PROTONIX) 40 MG tablet Take 1 tablet (40 mg total) by mouth daily. 90 tablet 3  . traMADol (ULTRAM) 50 MG tablet Take 1 tablet (50 mg total) by mouth 3 (three) times daily. 90 tablet 5  . triamcinolone (NASACORT AQ) 55 MCG/ACT AERO nasal inhaler Place 2 sprays into the nose daily. 1 Inhaler 12  . [DISCONTINUED] fluticasone (FLONASE) 50 MCG/ACT nasal spray Place 2 sprays into the nose daily.       No current facility-administered medications on file prior to visit.    Past Medical History  Diagnosis Date  . Hypertension   . MI (myocardial infarction) ([place])   . Gastrointestinal stromal tumor (GIST) of stomach 05/2009, 10/2009    2010: gastric ulcer with GIST: IR embolization left gastric artery.  2011: partial gastrectomy  . CAD (coronary artery disease) 05/2012  . HTN (hypertension) 03/29/2011  . Hyperlipidemia 03/29/2011  . BPH (benign prostatic hypertrophy) 03/29/2011  . GERD (gastroesophageal reflux disease) 03/29/2011  . S/P cholecystectomy 03/29/2011  . 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 Allergic rhinitis, cause unspecified 03/29/2011  . Anemia 05/2009    blood loss anemia, transfused 6 units.   . Arthritis   . Urine incontinence   . Low back pain   . DJD (degenerative joint disease), lumbar 04/01/2011  . Prostate cancer ([place]) 03/29/2011  . History of nuclear stress test 03/27/2010    exercise; no ischemic changes, normal study   . Colon polyp 05/2012    cecal, ascending tubular adenomas.     Past Surgical History  Procedure Laterality Date  . Coronary angioplasty with stent placement  04/24/2009    2 Xience DES to LAD 3.5x18mm and 3.0x37mm overlapping  . Tonsillectomy  1951  . Cholecystectomy  1986  . Partial gastrectomy  10/2009    GIST  . Transthoracic echocardiogram  10/09/2009    EF 55-60%; normaml LV systolic function with grade 1 diastolic dysfunction  . Gastric artery emoblization Left 05/2009     Social History   Social History  . Marital Status: Widowed    Spouse Name: N/A  . Number of Children: 3  . Years of Education: 16   Occupational History  . retired Art gallery manager    Social History Main Topics  . Smoking status: Former Smoker    Quit date: 10/07/1960  . Smokeless tobacco: Never Used     Comment: Stopped smoking in 1962  . Alcohol Use: 0.6 oz/week    1 Glasses of wine per week     Comment: very rare  . Drug Use: No  . Sexual Activity: Not Asked   Other Topics Concern  . None   Social History Narrative    Family History  Problem Relation Age of Onset  . Arthritis Mother   . Ovarian cancer Mother   . Heart failure Mother   . Arthritis Father   . Lung cancer  Father   . Colon cancer Neg Hx     Review of Systems  Constitutional: Positive for fatigue. Negative for fever and appetite change.  Respiratory: Negative for cough, shortness of breath and wheezing.   Cardiovascular: Positive for chest pain (very mild).       Objective:   Filed Vitals:   04/15/16 1520  BP: 128/64  Pulse: 74  Temp: 97.6 F (36.4 C)  Resp: 18   Filed Weights   04/15/16 1520  Weight: 129 lb (58.514 kg)   Body mass index is 19.62 kg/(m^2).   Physical Exam  Constitutional: He is oriented to person, place, and time. No distress.  Thin elderly male, in no acute distress  Cardiovascular: Normal rate and regular rhythm.   Pulmonary/Chest: No respiratory distress. He has no wheezes. He exhibits no tenderness.  Fair respiratory effort.  Slight decreased BS at right base.  No chest wall tenderness with palpation or deep breathes  Abdominal: Soft. He exhibits no distension. There is no tenderness.  Neurological: He is alert and oriented to person, place, and time.  Skin: He is not diaphoretic.  Psychiatric: He has a normal mood and affect. His behavior is normal.          Assessment & Plan:   See Problem List for Assessment and Plan of chronic medical  problems.

## 2016-04-15 NOTE — Patient Instructions (Signed)
Have chest x-ray today and we will call you with the results.  Finish your current antibiotic.  Call with any changes in your symptoms.

## 2016-04-16 ENCOUNTER — Other Ambulatory Visit: Payer: Self-pay | Admitting: Internal Medicine

## 2016-04-16 DIAGNOSIS — J189 Pneumonia, unspecified organism: Secondary | ICD-10-CM

## 2016-04-16 DIAGNOSIS — J90 Pleural effusion, not elsewhere classified: Secondary | ICD-10-CM

## 2016-04-23 ENCOUNTER — Ambulatory Visit (INDEPENDENT_AMBULATORY_CARE_PROVIDER_SITE_OTHER)
Admission: RE | Admit: 2016-04-23 | Discharge: 2016-04-23 | Disposition: A | Discharge status: Home / Self Care | Payer: Medicare Other | Source: Ambulatory Visit | Attending: Internal Medicine | Admitting: Internal Medicine

## 2016-04-23 DIAGNOSIS — J90 Pleural effusion, not elsewhere classified: Secondary | ICD-10-CM | POA: Diagnosis not present

## 2016-04-23 DIAGNOSIS — J189 Pneumonia, unspecified organism: Secondary | ICD-10-CM

## 2016-04-24 ENCOUNTER — Telehealth: Payer: Self-pay | Admitting: Internal Medicine

## 2016-04-24 NOTE — Telephone Encounter (Signed)
Called patient left message to give us a call back 

## 2016-04-24 NOTE — Telephone Encounter (Signed)
Is requesting x ray results.  Can leave message if no answer.   Daughter not on DPR.  Did notify her.  Can call patient back.

## 2016-05-09 ENCOUNTER — Other Ambulatory Visit: Payer: Self-pay | Admitting: Internal Medicine

## 2016-05-09 ENCOUNTER — Telehealth: Payer: Self-pay | Admitting: Internal Medicine

## 2016-05-09 MED ORDER — TRAMADOL HCL 50 MG PO TABS: 50.0000 mg | ORAL_TABLET | Freq: Three times a day (TID) | ORAL | 5 refills | Status: DC

## 2016-05-09 NOTE — Telephone Encounter (Signed)
F[facility] to [facility] 

## 2016-05-09 NOTE — Telephone Encounter (Signed)
Avalon for tramadol prn  Should be no need further tx pna if no fever, pain, worsening cough, sob or any other unusual symptoms

## 2016-05-09 NOTE — Telephone Encounter (Signed)
Patient is also requesting script for tramadol.

## 2016-05-09 NOTE — Telephone Encounter (Signed)
Patient is requesting call back with x ray results.  He is also requesting to know if he needs any further treatment in regard to pneumonia.

## 2016-05-09 NOTE — Telephone Encounter (Signed)
Forwarded

## 2016-05-10 ENCOUNTER — Ambulatory Visit (INDEPENDENT_AMBULATORY_CARE_PROVIDER_SITE_OTHER): Payer: Medicare Other | Admitting: Adult Health

## 2016-05-10 ENCOUNTER — Encounter: Payer: Self-pay | Admitting: Adult Health

## 2016-05-10 ENCOUNTER — Telehealth: Payer: Self-pay | Admitting: Internal Medicine

## 2016-05-10 VITALS — BP 126/64 | Ht 68.0 in | Wt 127.6 lb

## 2016-05-10 DIAGNOSIS — R208 Other disturbances of skin sensation: Secondary | ICD-10-CM | POA: Diagnosis not present

## 2016-05-10 DIAGNOSIS — R200 Anesthesia of skin: Secondary | ICD-10-CM

## 2016-05-10 NOTE — Telephone Encounter (Signed)
Patient Name: Eric Cooley  DOB: Oct 06, 1920    Initial Comment has numbness in left leg started last sunday   Nurse Assessment  Nurse: Mallie Mussel, RN, Alveta Heimlich Date/Time Eilene Ghazi Time): 05/10/2016 9:10:16 AM  Confirm and document reason for call. If symptomatic, describe symptoms. You must click the next button to save text entered. ---Caller states that he has numbness in his left leg from the knee down. It has been numb since Sunday. Denies being a diabetic. He is able to stand, bear weight and walk. He denies injury. He doesn't see any swelling in the leg, foot or ankle.  Has the patient traveled out of the country within the last 30 days? ---No  Does the patient have any new or worsening symptoms? ---Yes  Will a triage be completed? ---Yes  Related visit to physician within the last 2 weeks? ---No  Does the PT have any chronic conditions? (i.e. diabetes, asthma, etc.) ---No  Is this a behavioral health or substance abuse call? ---No     Guidelines    Guideline Title Affirmed Question Affirmed Notes  Neurologic Deficit [1] Numbness (i.e., loss of sensation) of the face, arm / hand, or leg / foot on one side of the body AND [2] gradual onset (e.g., days to weeks) AND [3] present now    Final Disposition User   See Physician within 4 Hours (or PCP triage) Mallie Mussel, RN, Alveta Heimlich    Comments  Denies back pain at present. He does have back pain that comes and goes at times.  No appointments at [facility]. I was able to schedule him to be seen with Dorothyann Peng at 1:30pm.   Referrals  REFERRED TO PCP OFFICE   Disagree/Comply: Comply

## 2016-05-10 NOTE — Progress Notes (Signed)
Subjective:    Patient ID: Eric Cooley, male    DOB: [birth year], 82 y.o.   MRN: [id number]  HPI  82 year old male who  has a past medical history of Allergic rhinitis, cause unspecified (03/29/2011); Anemia (05/2009); Arthritis; BPH (benign prostatic hypertrophy) (03/29/2011); CAD (coronary artery disease) (05/2012); Colon polyp (05/2012); DJD (degenerative joint disease), lumbar (04/01/2011); Gastrointestinal stromal tumor (GIST) of stomach (05/2009, 10/2009); GERD (gastroesophageal reflux disease) (03/29/2011); History of nuclear stress test (03/27/2010); HTN (hypertension) (03/29/2011); Hyperlipidemia (03/29/2011); Hypertension; Low back pain; MI (myocardial infarction) ([place]); Prostate cancer ([place]) (03/29/2011); S/P cholecystectomy (03/29/2011); 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 He is a patient of Dr. Jenny Reichmann.   He reports that he has had numbness in his left leg from the knee down. His symptoms have been apparent for the last five days. He denies any pain, swelling, bruising, redness, or warmth. There is no increased numbness when bearing weight.   He reports that he has not been taking any pain medication and when he stretches his leg or massages the area, there is improvement in the numbness.   He has not been on any long car trips or plane rides.   He denies and SOB or CP  Review of Systems  Constitutional: Negative.   Respiratory: Negative.   Cardiovascular: Negative.   Neurological: Positive for numbness. Negative for dizziness, syncope, speech difficulty, weakness, light-headedness and headaches.  Hematological: Negative.   All other systems reviewed and are negative.  Past Medical History:  Diagnosis Date  . Allergic rhinitis, cause unspecified 03/29/2011  . Anemia 05/2009   blood loss anemia, transfused 6 units.   . Arthritis   . BPH (benign prostatic hypertrophy) 03/29/2011  . CAD (coronary artery disease) 05/2012  . Colon  polyp 05/2012   cecal, ascending tubular adenomas.   . DJD (degenerative joint disease), lumbar 04/01/2011  . Gastrointestinal stromal tumor (GIST) of stomach 05/2009, 10/2009   2010: gastric ulcer with GIST: IR embolization left gastric artery.  2011: partial gastrectomy  . GERD (gastroesophageal reflux disease) 03/29/2011  . History of nuclear stress test 03/27/2010   exercise; no ischemic changes, normal study   . HTN (hypertension) 03/29/2011  . Hyperlipidemia 03/29/2011  . Hypertension   . Low back pain   . MI (myocardial infarction) ([place])   . Prostate cancer ([place]) 03/29/2011  . S/P cholecystectomy 03/29/2011  . 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 Urine incontinence     Social History   Social History  . Marital status: Widowed    Spouse name: N/A  . Number of children: 3  . Years of education: 59   Occupational History  . retired Art gallery manager    Social History Main Topics  . Smoking status: Former Smoker    Quit date: 10/07/1960  . Smokeless tobacco: Never Used     Comment: Stopped smoking in 1962  . Alcohol use 0.6 oz/week    1 Glasses of wine per week     Comment: very rare  . Drug use: No  . Sexual activity: Not on file   Other Topics Concern  . Not on file   Social History Narrative  . No narrative on file    Past Surgical History:  Procedure Laterality Date  . CHOLECYSTECTOMY  1986  . CORONARY ANGIOPLASTY WITH STENT PLACEMENT  04/24/2009   2 Xience DES to LAD 3.5x45mm and 3.0x49mm overlapping  . gastric artery emoblization Left 05/2009  .  PARTIAL GASTRECTOMY  10/2009   GIST  . TONSILLECTOMY  1951  . TRANSTHORACIC ECHOCARDIOGRAM  10/09/2009   EF 55-60%; normaml LV systolic function with grade 1 diastolic dysfunction    Family History  Problem Relation Age of Onset  . Arthritis Mother   . Ovarian cancer Mother   . Heart failure Mother   . Arthritis Father   . Lung cancer Father   . Colon cancer Neg Hx     No  Known Allergies  Current Outpatient Prescriptions on File Prior to Visit  Medication Sig Dispense Refill  . aspirin 81 MG EC tablet Take 1 tablet (81 mg total) by mouth daily. 30 tablet 12  . lisinopril (PRINIVIL,ZESTRIL) 5 MG tablet Take 1 tablet (5 mg total) by mouth daily. 90 tablet 3  . Memantine HCl-Donepezil HCl (NAMZARIC) 28-10 MG CP24 Take 1 tablet by mouth daily. 90 capsule 3  . pantoprazole (PROTONIX) 40 MG tablet Take 1 tablet (40 mg total) by mouth daily. 90 tablet 3  . traMADol (ULTRAM) 50 MG tablet Take 1 tablet (50 mg total) by mouth 3 (three) times daily. 90 tablet 5  . [DISCONTINUED] fluticasone (FLONASE) 50 MCG/ACT nasal spray Place 2 sprays into the nose daily.       No current facility-administered medications on file prior to visit.     BP 126/64   Ht 5\' 8"  (1.727 m)   Wt 127 lb 9.6 oz (57.9 kg)   BMI 19.40 kg/m       Objective:   Physical Exam  Constitutional: He is oriented to person, place, and time. He appears well-developed and well-nourished. No distress.  Cardiovascular: Normal rate, regular rhythm, normal heart sounds and intact distal pulses.  Exam reveals no gallop.   No murmur heard. Pulmonary/Chest: Effort normal and breath sounds normal. No respiratory distress. He has no wheezes. He has no rales. He exhibits no tenderness.  Musculoskeletal: Normal range of motion. He exhibits no edema, tenderness or deformity.  The area in question is the left shin. There is no redness, bruising, pain with palpation, warmth, or edema. Both calf's are 32 cm in diameter. There is no signs of DVT  Neurological: He is alert and oriented to person, place, and time.  Skin: Skin is warm and dry. No rash noted. He is not diaphoretic. No erythema. No pallor.  Psychiatric: He has a normal mood and affect. His behavior is normal. Judgment and thought content normal.  Nursing note and vitals reviewed.     Assessment & Plan:  1. Numbness in left leg - Unsure of why this  patient has numbness in his left leg. Probably MSK issue. No concern for DVT at this point.  - Continue with stretching exercises that improve the discomfort.  - Advised tylenol 500mg  Q6-8H PRN and heating pad.  - Follow up if symptoms do not improve or go to the ER if symptoms become worse.   Dorothyann Peng, NP

## 2016-05-10 NOTE — Patient Instructions (Signed)
It was great meeting you today!  I am not 100% sure what is causing the numbness in your leg. I would advise taking tylenol 500mg  every 8 hours as needed, use a heating pad and continue with massage and stretching.   Follow up if no improvement

## 2016-05-14 ENCOUNTER — Telehealth: Payer: Self-pay

## 2016-05-14 MED ORDER — MEMANTINE HCL-DONEPEZIL HCL ER 28-10 MG PO CP24: 1.0000 | ORAL_CAPSULE | Freq: Every day | ORAL | 3 refills | Status: DC

## 2016-05-14 NOTE — Telephone Encounter (Signed)
Memantine HCl-Donepezil HCl (NAMZARIC) 28-10 MG CP24 [id number]   Patients daughter called and said he lost his rx on this medication. They was unsure what they needed to do about it at this time. It can't be filled until the end of August. She didn't know we had samples. Please follow up, Thank you. Call the daughter back [phone] - Gregary Signs

## 2016-05-14 NOTE — Telephone Encounter (Signed)
Done erx to [facility]  [place] for early refill

## 2016-05-15 NOTE — Telephone Encounter (Signed)
Called and LVM that it was ok to refill.

## 2016-05-17 IMAGING — MR MR 3D RECON AT SCANNER
18 of 22 series · 18 of 22 positions shown · IV contrast (multihance)
Comparison: CT scan from 10/10/2009.

CLINICAL DATA: Initial encounter for elevated LFTs with right upper
quadrant pain.

EXAM:
MRI ABDOMEN WITHOUT AND WITH CONTRAST (INCLUDING MRCP)
TECHNIQUE: Multiplanar multisequence MR imaging of the abdomen was performed
both before and after the administration of intravenous contrast.
Heavily T2-weighted images of the biliary and pancreatic ducts were
obtained, and three-dimensional MRCP images were rendered by post
processing.
CONTRAST:  12mL MULTIHANCE GADOBENATE DIMEGLUMINE 529 MG/ML IV SOLN

[Series 3: T2 fat-sat · axial · 5.0mm · 0.78mm/px · 1 of 47 slices shown]
[im 1/47]
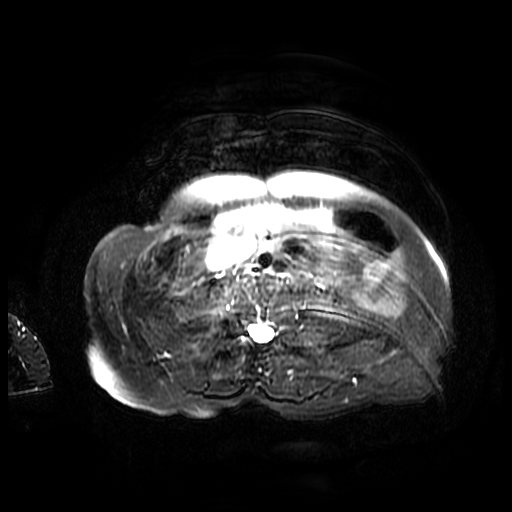

[Series 4: MRCP · coronal · 1.6mm · 0.62mm/px · 1 of 96 slices shown (1 of 2)]
[im 1/96]
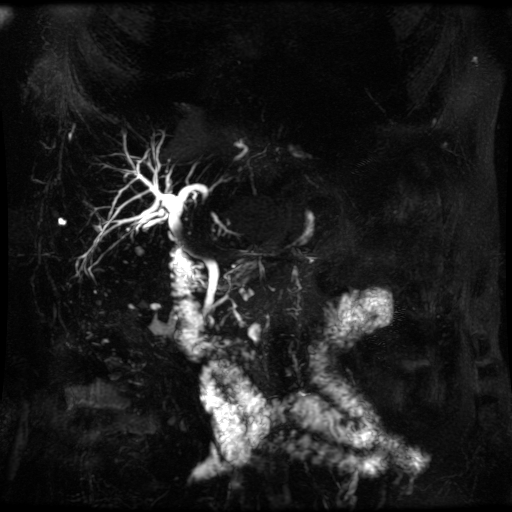

[Series 6: DWI b500 · axial · 6.0mm · 1.48mm/px · 1 of 53 slices shown]
[im 1/53]
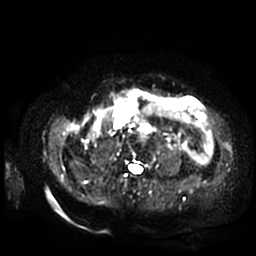

[Series 9: ax dualecho · axial · 5.0mm · 0.78mm/px · 1 of 90 slices shown]
[im 1/90]
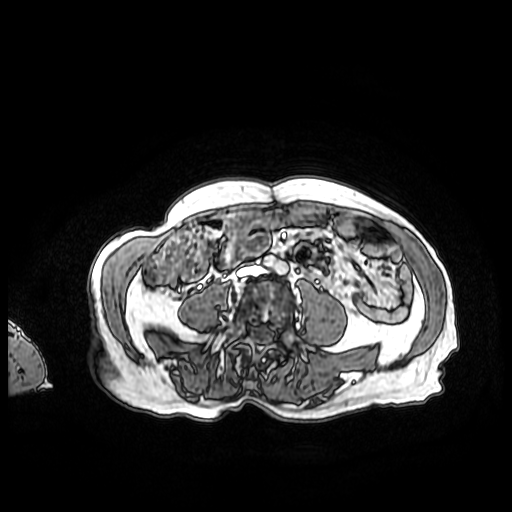

[Series 10: MRCP · coronal · 40.0mm · 0.70mm/px · 1 of 9 slices shown (2 of 2)]
[im 1/9]
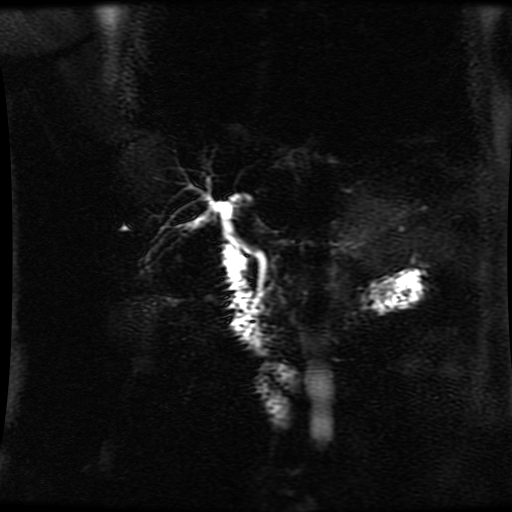

[Series 11: bSSFP fat-sat · coronal · 5.0mm · 0.70mm/px · 1 of 35 slices shown]
[im 1/35]
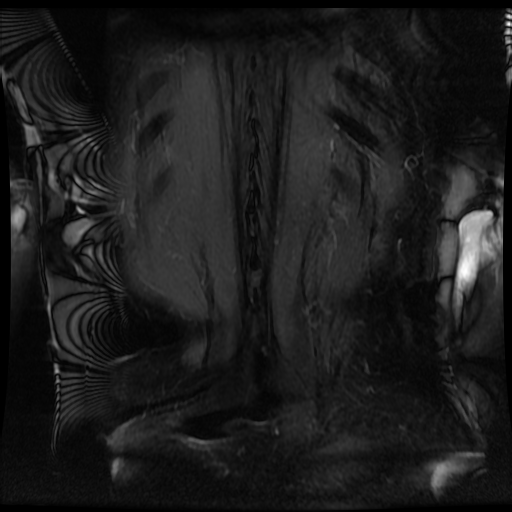

[Series 12: T2 · axial · 5.0mm · 0.78mm/px · 1 of 45 slices shown]
[im 1/45]
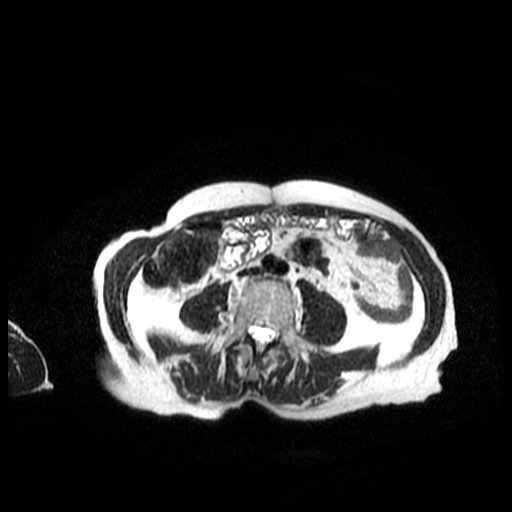

[Series 400: reformatted · axial · 1.6mm · 0.62mm/px · 1 of 141 slices shown]
[im 1/141]
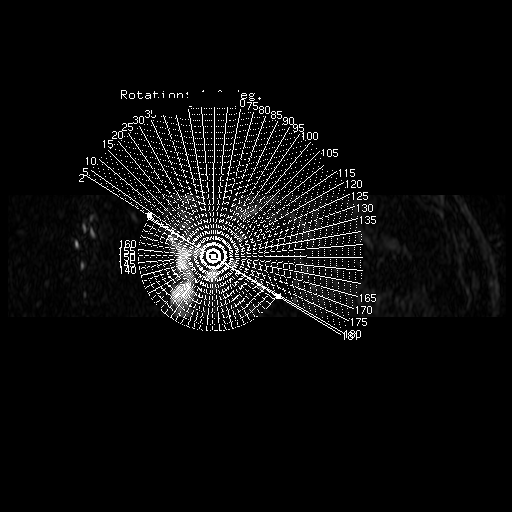

[Series 600: DWI · axial · 6.0mm · 1.48mm/px · 1 of 30 slices shown]
[im 1/30]
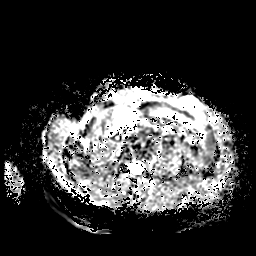

[Series 1300: T1 dynamic · axial · 5.0mm · 0.78mm/px · 1 of 88 slices shown (1 of 5)]
[im 1/88]
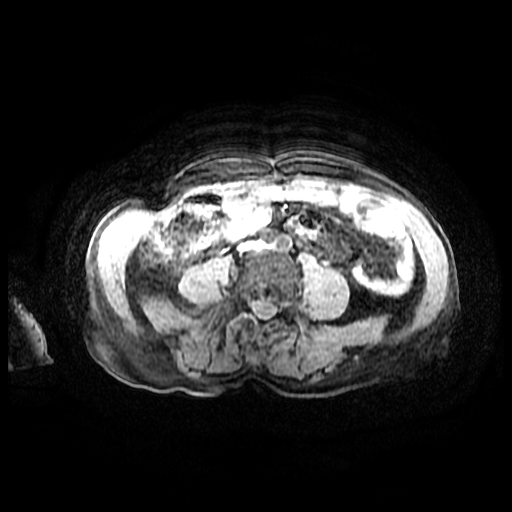

[Series 1301: T1 dynamic · axial · 5.0mm · 0.78mm/px · 1 of 88 slices shown (2 of 5)]
[im 1/88]
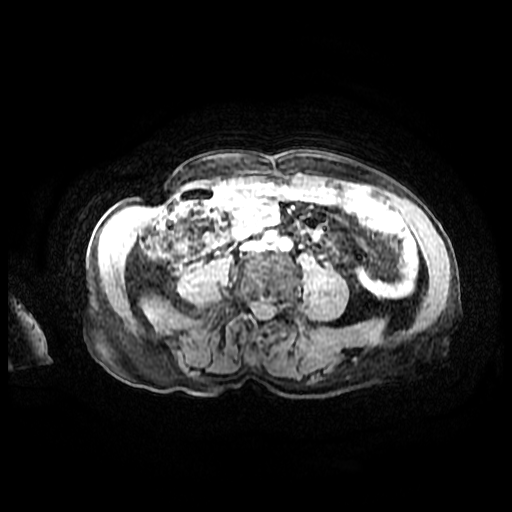

[Series 1303: T1 dynamic · axial · 5.0mm · 0.78mm/px · 1 of 88 slices shown (3 of 5)]
[im 1/88]
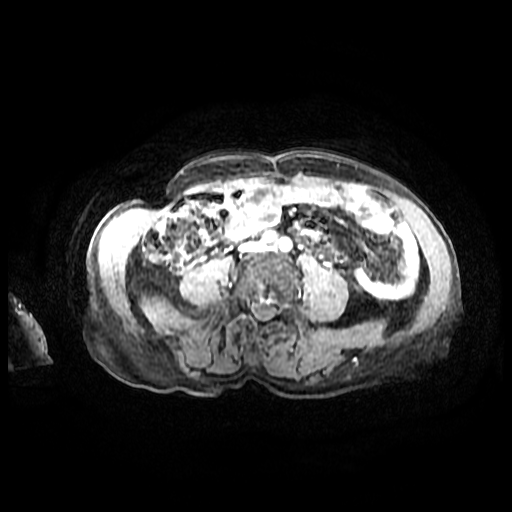

[Series 1304: T1 dynamic · axial · 5.0mm · 0.78mm/px · 1 of 88 slices shown (4 of 5)]
[im 1/88]
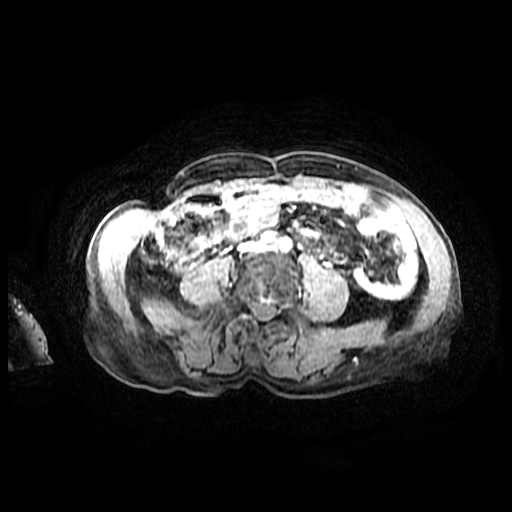

[Series 1305: T1 dynamic · axial · 5.0mm · 0.78mm/px · 1 of 88 slices shown (5 of 5)]
[im 1/88]
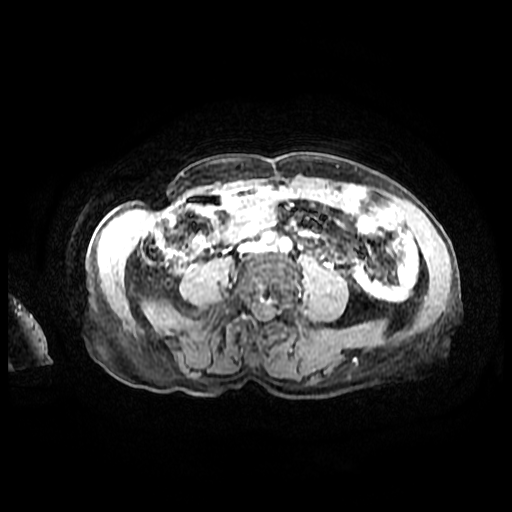

[((id)/(id)/1)-((id)/(id)/1) · axial · 5.0mm · 0.78mm/px · 1 of 88 slices shown (1 of 4)]
[im 1/88]
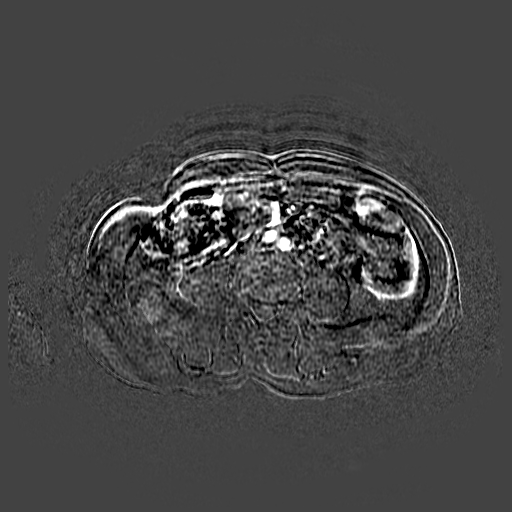

[((id)/(id)/1)-((id)/(id)/1) · axial · 5.0mm · 0.78mm/px · 1 of 88 slices shown (2 of 4)]
[im 1/88]
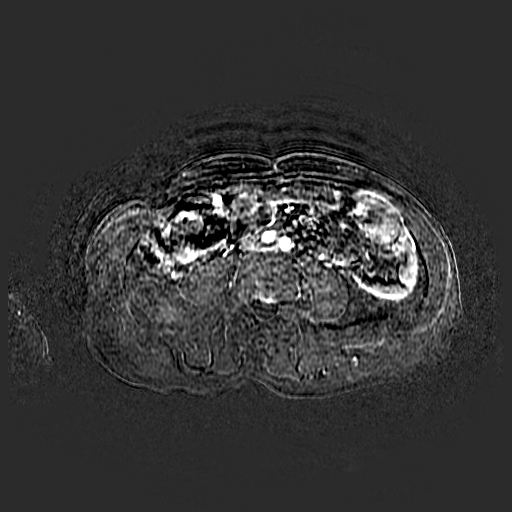

[((id)/(id)/1)-((id)/(id)/1) · axial · 5.0mm · 0.78mm/px · 1 of 88 slices shown (3 of 4)]
[im 1/88]
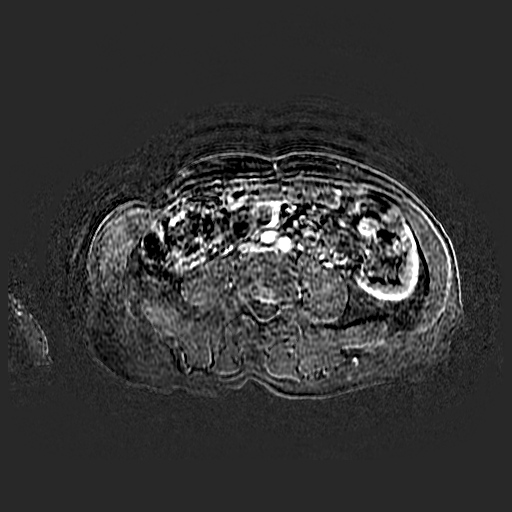

[((id)/(id)/1)-((id)/(id)/1) · axial · 5.0mm · 0.78mm/px · 1 of 88 slices shown (4 of 4)]
[im 1/88]
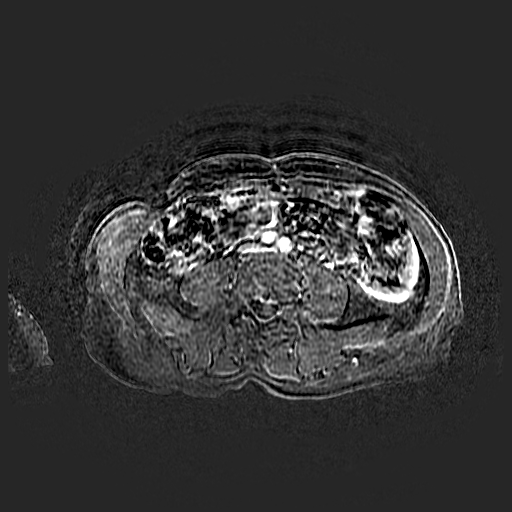

[18 of 22 positions shown; findings below may reference images not displayed]

FINDINGS: Lower chest:  Bibasilar compressive atelectasis is noted.

Hepatobiliary: Tiny hepatic cyst in the dome of the liver is
unchanged. Other tiny hepatic cysts are new in the interval.
Gallbladder is surgically absent. Mild prominence of the
intrahepatic bile ducts is unchanged. Susceptibility artifact from
the patient's left gastric vascular embolization coils obscures much
of the pancreatic head and body. Despite this artifact, the common
duct and common bile duct are visualized on many pulse sequences.
Although the MRCP images are markedly degraded by the artifact,
axial images show no focal dilatation of the extrahepatic biliary
tree. There is no intraluminal filling defect within the
extrahepatic common duct to suggest stone. The common bile duct
measured 6 mm in the head of the pancreas unchanged from the prior
CT scan.

Pancreas: Susceptibility artifacts from the patient's left gastric
vascular embolization coils obscures much of the pancreatic head and
body. No evidence for mass lesion in the pancreatic tail.

Spleen: No splenomegaly. No focal mass lesion.

Adrenals/Urinary Tract: 9 mm left renal cyst was 4 mm previously.
Very tiny cortical cysts are seen in the right kidney. No evidence
for enhancing renal lesion on either side.

Stomach/Bowel: Stomach is largely obscured by susceptibility
artifact. No evidence for small bowel obstruction. Abdominal
segments of the colon appear grossly normal aside from some
scattered diverticular change.

Vascular/Lymphatic: No abdominal aortic aneurysm. No evidence for
abdominal lymphadenopathy with some obscuration of the
retroperitoneal space by artifact.

Other: No intraperitoneal free fluid.

Musculoskeletal: No abnormal marrow signal or enhancement within the
visualized bony anatomy.
IMPRESSION: 1. No CT findings to explain the patient's history of elevated LFTs
and right upper quadrant pain. Mild intra and extrahepatic biliary
duct distention is unchanged comparing back to a CT scan from 5
years ago. No evidence for choledocholithiasis on today's exam.
2. Hepatic and renal cysts.

## 2016-09-19 ENCOUNTER — Ambulatory Visit: Payer: Medicare Other | Admitting: Internal Medicine

## 2016-11-28 ENCOUNTER — Other Ambulatory Visit: Payer: Self-pay | Admitting: Internal Medicine

## 2016-11-28 NOTE — Telephone Encounter (Signed)
Routing to dr john, please advise, thanks 

## 2016-11-28 NOTE — Telephone Encounter (Signed)
Done hardcopy to anna 

## 2016-11-29 NOTE — Telephone Encounter (Signed)
RX faxed to [facility] 

## 2016-12-05 ENCOUNTER — Encounter: Payer: Self-pay | Admitting: Internal Medicine

## 2016-12-05 ENCOUNTER — Ambulatory Visit (INDEPENDENT_AMBULATORY_CARE_PROVIDER_SITE_OTHER): Payer: Medicare Other | Admitting: Internal Medicine

## 2016-12-05 VITALS — BP 108/62 | HR 76 | Temp 97.8°F | Ht 68.0 in | Wt 126.0 lb

## 2016-12-05 DIAGNOSIS — J309 Allergic rhinitis, unspecified: Secondary | ICD-10-CM | POA: Diagnosis not present

## 2016-12-05 DIAGNOSIS — M25511 Pain in right shoulder: Secondary | ICD-10-CM | POA: Diagnosis not present

## 2016-12-05 DIAGNOSIS — M79605 Pain in left leg: Secondary | ICD-10-CM

## 2016-12-05 DIAGNOSIS — I10 Essential (primary) hypertension: Secondary | ICD-10-CM | POA: Diagnosis not present

## 2016-12-05 DIAGNOSIS — M79604 Pain in right leg: Secondary | ICD-10-CM | POA: Insufficient documentation

## 2016-12-05 MED ORDER — GABAPENTIN 300 MG PO CAPS: 300.0000 mg | ORAL_CAPSULE | Freq: Three times a day (TID) | ORAL | 3 refills | Status: DC

## 2016-12-05 MED ORDER — GABAPENTIN 300 MG PO CAPS: 300.0000 mg | ORAL_CAPSULE | Freq: Every day | ORAL | 1 refills | Status: DC

## 2016-12-05 MED ORDER — TRIAMCINOLONE ACETONIDE 55 MCG/ACT NA AERO: 2.0000 | INHALATION_SPRAY | Freq: Every day | NASAL | 12 refills | Status: DC

## 2016-12-05 MED ORDER — FEXOFENADINE HCL 180 MG PO TABS
180.0000 mg | ORAL_TABLET | Freq: Every day | ORAL | 2 refills | Status: DC
Start: 2016-12-05 — End: 2017-08-24

## 2016-12-05 NOTE — Patient Instructions (Signed)
Please take all new medication as prescribed - the gabapentin 300 mg at bedtime  Please take all new medication as prescribed - the allegra and nasacort for the allergies  You will be contacted regarding the referral for: Dr Tamala Julian - sports medicine in this office  Please continue all other medications as before, and refills have been done if requested.  Please have the pharmacy call with any other refills you may need.  Please keep your appointments with your specialists as you may have planned

## 2016-12-08 NOTE — Assessment & Plan Note (Signed)
stable overall by history and exam, recent data reviewed with pt, and pt to continue medical treatment as before,  to f/u any worsening symptoms or concerns BP Readings from Last 3 Encounters:  12/05/16 108/62  05/10/16 126/64  04/15/16 128/64

## 2016-12-08 NOTE — Assessment & Plan Note (Signed)
With perennial symptoms, for depomedrol IM 80, allegra and nasacort asd, also mucinex otc prn

## 2016-12-08 NOTE — Assessment & Plan Note (Signed)
Etiology unclear, for tylenol prn, refer sport medicine

## 2016-12-08 NOTE — Assessment & Plan Note (Addendum)
With back pain, ? Neurogenic claudication vs other neuritic pain worse with lying down, declines imaging or ortho referral, for gabapentin 300 qhs   Note:  Total time for pt hx, exam, review of record with pt in the room, determination of diagnoses and plan for further eval and tx is > 40 min, with over 50% spent in coordination and counseling of patient

## 2016-12-08 NOTE — Progress Notes (Signed)
Subjective:    Patient ID: Eric Cooley, male    DOB: Sep 19, 1920, 83 y.o.   MRN: [id number]  HPI  Here to f/u, c/o Does have 1 year ongoing nasal allergy symptoms with clearish congestion, itch and sneezing, without fever, pain, ST, cough, swelling or wheezing, and claritin helps somewhat.  Also c/o chronic persistent back pain for at least 1 year, across the lower back, mild to mod but keeps recurring; pain worst at night with lying down with radiation to both distal legs without weakness, pt does not want any med for during daytime to affect his cognition and risk of fall but ok for qhs; also c/o right shoulder pain mild to mod x 1 mo, worse to lie on right side or abduct the arm. No specific neck pain or upper or lower radicular symptoms or motor weakness. Pt denies chest pain, increased sob or doe, wheezing, orthopnea, PND, increased LE swelling, palpitations, dizziness or syncope.   Pt denies polydipsia, polyuria Past Medical History:  Diagnosis Date  . Allergic rhinitis, cause unspecified 03/29/2011  . Anemia 05/2009   blood loss anemia, transfused 6 units.   . Arthritis   . BPH (benign prostatic hypertrophy) 03/29/2011  . CAD (coronary artery disease) 05/2012  . Colon polyp 05/2012   cecal, ascending tubular adenomas.   . DJD (degenerative joint disease), lumbar 04/01/2011  . Gastrointestinal stromal tumor (GIST) of stomach ([place]) 05/2009, 10/2009   2010: gastric ulcer with GIST: IR embolization left gastric artery.  2011: partial gastrectomy  . GERD (gastroesophageal reflux disease) 03/29/2011  . History of nuclear stress test 03/27/2010   exercise; no ischemic changes, normal study   . HTN (hypertension) 03/29/2011  . Hyperlipidemia 03/29/2011  . Hypertension   . Low back pain   . MI (myocardial infarction)   . Prostate cancer ([place]) 03/29/2011  . S/P cholecystectomy 03/29/2011  . 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 Urine incontinence    Past Surgical  History:  Procedure Laterality Date  . CHOLECYSTECTOMY  1986  . CORONARY ANGIOPLASTY WITH STENT PLACEMENT  04/24/2009   2 Xience DES to LAD 3.5x71mm and 3.0x81mm overlapping  . gastric artery emoblization Left 05/2009  . PARTIAL GASTRECTOMY  10/2009   GIST  . TONSILLECTOMY  1951  . TRANSTHORACIC ECHOCARDIOGRAM  10/09/2009   EF 55-60%; normaml LV systolic function with grade 1 diastolic dysfunction    reports that he quit smoking about 56 years ago. He has never used smokeless tobacco. He reports that he drinks about 0.6 oz of alcohol per week . He reports that he does not use drugs. family history includes Arthritis in his father and mother; Heart failure in his mother; Lung cancer in his father; Ovarian cancer in his mother. No Known Allergies Current Outpatient Prescriptions on File Prior to Visit  Medication Sig Dispense Refill  . aspirin 81 MG EC tablet Take 1 tablet (81 mg total) by mouth daily. 30 tablet 12  . lisinopril (PRINIVIL,ZESTRIL) 5 MG tablet Take 1 tablet (5 mg total) by mouth daily. 90 tablet 3  . Memantine HCl-Donepezil HCl (NAMZARIC) 28-10 MG CP24 Take 1 tablet by mouth daily. 90 capsule 3  . pantoprazole (PROTONIX) 40 MG tablet Take 1 tablet (40 mg total) by mouth daily. 90 tablet 3  . traMADol (ULTRAM) 50 MG tablet take 1 tablet by mouth three times a day 90 tablet 5  . [DISCONTINUED] fluticasone (FLONASE) 50 MCG/ACT nasal spray Place 2 sprays into the nose  daily.       No current facility-administered medications on file prior to visit.    Review of Systems  Constitutional: Negative for unusual diaphoresis or night sweats HENT: Negative for ear swelling or discharge Eyes: Negative for worsening visual haziness  Respiratory: Negative for choking and stridor.   Gastrointestinal: Negative for distension or worsening eructation Genitourinary: Negative for retention or change in urine volume.  Musculoskeletal: Negative for other MSK pain or swelling Skin: Negative for  color change and worsening wound Neurological: Negative for tremors and numbness other than noted  Psychiatric/Behavioral: Negative for decreased concentration or agitation other than above   All other system neg per pt    Objective:   Physical Exam BP 108/62   Pulse 76   Temp 97.8 F (36.6 C)   Ht 5\' 8"  (1.727 m)   Wt 126 lb (57.2 kg)   SpO2 97%   BMI 19.16 kg/m  VS noted,  Constitutional: Pt appears in no apparent distress HENT: Head: NCAT.  Right Ear: External ear normal.  Left Ear: External ear normal.  Bilat tm's with mild erythema.  Max sinus areas non tender.  Pharynx with mild erythema, no exudate Eyes: . Pupils are equal, round, and reactive to light. Conjunctivae and EOM are normal Neck: Normal range of motion. Neck supple.  Cardiovascular: Normal rate and regular rhythm.   Pulmonary/Chest: Effort normal and breath sounds without rales or wheezing.  Abd:  Soft, NT, ND, + BS Neurological: Pt is alert. Not confused , motor grossly intact Skin: Skin is warm. No rash, no LE edema Psychiatric: Pt behavior is normal. No agitation.  Spine nontender and without lumbar paravertebral tender Right shoulder with reduced ROM, tender subacromial     Assessment & Plan:

## 2016-12-16 NOTE — Progress Notes (Deleted)
[facility] [place] [address] [place], [place] [address] Phone: [phone] Subjective:    I'm seeing this patient by the request  of:  Cathlean Cower, MD   CC: Right shoulder pain   [id number]  Eric Cooley is a 83 y.o. male coming in with complaint of right shoulder pain.     Past Medical History:  Diagnosis Date  . Allergic rhinitis, cause unspecified 03/29/2011  . Anemia 05/2009   blood loss anemia, transfused 6 units.   . Arthritis   . BPH (benign prostatic hypertrophy) 03/29/2011  . CAD (coronary artery disease) 05/2012  . Colon polyp 05/2012   cecal, ascending tubular adenomas.   . DJD (degenerative joint disease), lumbar 04/01/2011  . Gastrointestinal stromal tumor (GIST) of stomach ([place]) 05/2009, 10/2009   2010: gastric ulcer with GIST: IR embolization left gastric artery.  2011: partial gastrectomy  . GERD (gastroesophageal reflux disease) 03/29/2011  . History of nuclear stress test 03/27/2010   exercise; no ischemic changes, normal study   . HTN (hypertension) 03/29/2011  . Hyperlipidemia 03/29/2011  . Hypertension   . Low back pain   . MI (myocardial infarction)   . Prostate cancer ([place]) 03/29/2011  . S/P cholecystectomy 03/29/2011  . 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 Urine incontinence    Past Surgical History:  Procedure Laterality Date  . CHOLECYSTECTOMY  1986  . CORONARY ANGIOPLASTY WITH STENT PLACEMENT  04/24/2009   2 Xience DES to LAD 3.5x3mm and 3.0x16mm overlapping  . gastric artery emoblization Left 05/2009  . PARTIAL GASTRECTOMY  10/2009   GIST  . TONSILLECTOMY  1951  . TRANSTHORACIC ECHOCARDIOGRAM  10/09/2009   EF 55-60%; normaml LV systolic function with grade 1 diastolic dysfunction   Social History   Social History  . Marital status: Widowed    Spouse name: N/A  . Number of children: 3  . Years of education: 80   Occupational History  . retired Art gallery manager    Social  History Main Topics  . Smoking status: Former Smoker    Quit date: 10/07/1960  . Smokeless tobacco: Never Used     Comment: Stopped smoking in 1962  . Alcohol use 0.6 oz/week    1 Glasses of wine per week     Comment: very rare  . Drug use: No  . Sexual activity: Not on file   Other Topics Concern  . Not on file   Social History Narrative  . No narrative on file   No Known Allergies Family History  Problem Relation Age of Onset  . Arthritis Mother   . Ovarian cancer Mother   . Heart failure Mother   . Arthritis Father   . Lung cancer Father   . Colon cancer Neg Hx     Past medical history, social, surgical and family history all reviewed in electronic medical record.  No pertanent information unless stated regarding to the chief complaint.   Review of Systems:Review of systems updated and as accurate as of 12/16/16  No headache, visual changes, nausea, vomiting, diarrhea, constipation, dizziness, abdominal pain, skin rash, fevers, chills, night sweats, weight loss, swollen lymph nodes, body aches, joint swelling, muscle aches, chest pain, shortness of breath, mood changes.   Objective  There were no vitals taken for this visit. Systems examined below as of 12/16/16   General: No apparent distress alert and oriented x3 mood and affect normal, dressed appropriately.  HEENT: Pupils equal, extraocular movements intact  Respiratory: Patient's speak in full sentences and does not appear short of breath  Cardiovascular: No lower extremity edema, non tender, no erythema  Skin: Warm dry intact with no signs of infection or rash on extremities or on axial skeleton.  Abdomen: Soft nontender  Neuro: Cranial nerves II through XII are intact, neurovascularly intact in all extremities with 2+ DTRs and 2+ pulses.  Lymph: No lymphadenopathy of posterior or anterior cervical chain or axillae bilaterally.  Gait normal with good balance and coordination.  MSK:  Non tender with full range of  motion and good stability and symmetric strength and tone of shoulders, elbows, wrist, hip, knee and ankles bilaterally.     Impression and Recommendations:     This case required medical decision making of moderate complexity.      Note: This dictation was prepared with Dragon dictation along with smaller phrase technology. Any transcriptional errors that result from this process are unintentional.

## 2016-12-17 ENCOUNTER — Ambulatory Visit: Payer: Medicare Other | Admitting: Family Medicine

## 2017-02-11 ENCOUNTER — Encounter: Payer: Self-pay | Admitting: Internal Medicine

## 2017-02-11 ENCOUNTER — Ambulatory Visit (INDEPENDENT_AMBULATORY_CARE_PROVIDER_SITE_OTHER): Payer: Medicare Other | Admitting: Internal Medicine

## 2017-02-11 VITALS — BP 142/86 | HR 76 | Ht 68.0 in | Wt 136.0 lb

## 2017-02-11 DIAGNOSIS — I10 Essential (primary) hypertension: Secondary | ICD-10-CM

## 2017-02-11 DIAGNOSIS — R2689 Other abnormalities of gait and mobility: Secondary | ICD-10-CM | POA: Diagnosis not present

## 2017-02-11 DIAGNOSIS — E785 Hyperlipidemia, unspecified: Secondary | ICD-10-CM | POA: Diagnosis not present

## 2017-02-11 DIAGNOSIS — R0789 Other chest pain: Secondary | ICD-10-CM

## 2017-02-11 NOTE — Progress Notes (Signed)
Pre visit review using our clinic review tool, if applicable. No additional management support is needed unless otherwise documented below in the visit note. 

## 2017-02-11 NOTE — Progress Notes (Signed)
Subjective:    Patient ID: Eric Cooley, male    DOB: 1920/07/23, 83 y.o.   MRN: [id number]  HPI  Here to f/u with family support, had an unfortunate trip and fall 3 days ago in the bedroom on a hard furniture, striking the mid right lateral chest, with then scraping abrasion down to waist level from there.   No fever, worsening red/swelling or further falls, but still walking stiffly and unsteady with persistent mod pain, constant, pleuritic, sharp, worse to twist or bend at the waist, better to sit more still.  Daughter with him willing to support him on outpatient PT.and will help hiim return for having labs drawn.  Does c/o ongoing fatigue, but denies signficant daytime hypersomnolence.  Pt denies increased sob or doe, wheezing, orthopnea, PND, increased LE swelling, palpitations, dizziness or syncope.   Past Medical History:  Diagnosis Date  . Allergic rhinitis, cause unspecified 03/29/2011  . Anemia 05/2009   blood loss anemia, transfused 6 units.   . Arthritis   . BPH (benign prostatic hypertrophy) 03/29/2011  . CAD (coronary artery disease) 05/2012  . Colon polyp 05/2012   cecal, ascending tubular adenomas.   . DJD (degenerative joint disease), lumbar 04/01/2011  . Gastrointestinal stromal tumor (GIST) of stomach ([place]) 05/2009, 10/2009   2010: gastric ulcer with GIST: IR embolization left gastric artery.  2011: partial gastrectomy  . GERD (gastroesophageal reflux disease) 03/29/2011  . History of nuclear stress test 03/27/2010   exercise; no ischemic changes, normal study   . HTN (hypertension) 03/29/2011  . Hyperlipidemia 03/29/2011  . Hypertension   . Low back pain   . MI (myocardial infarction) ([place])   . Prostate cancer ([place]) 03/29/2011  . S/P cholecystectomy 03/29/2011  . 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 Urine incontinence    Past Surgical History:  Procedure Laterality Date  . CHOLECYSTECTOMY  1986  . CORONARY ANGIOPLASTY WITH STENT  PLACEMENT  04/24/2009   2 Xience DES to LAD 3.5x57mm and 3.0x61mm overlapping  . gastric artery emoblization Left 05/2009  . PARTIAL GASTRECTOMY  10/2009   GIST  . TONSILLECTOMY  1951  . TRANSTHORACIC ECHOCARDIOGRAM  10/09/2009   EF 55-60%; normaml LV systolic function with grade 1 diastolic dysfunction    reports that he quit smoking about 56 years ago. He has never used smokeless tobacco. He reports that he drinks about 0.6 oz of alcohol per week . He reports that he does not use drugs. family history includes Arthritis in his father and mother; Heart failure in his mother; Lung cancer in his father; Ovarian cancer in his mother. No Known Allergies Current Outpatient Prescriptions on File Prior to Visit  Medication Sig Dispense Refill  . aspirin 81 MG EC tablet Take 1 tablet (81 mg total) by mouth daily. 30 tablet 12  . dextromethorphan-guaiFENesin (MUCINEX DM) 30-600 MG 12hr tablet Take 1 tablet by mouth 2 (two) times daily.    . fexofenadine (ALLEGRA) 180 MG tablet Take 1 tablet (180 mg total) by mouth daily. 30 tablet 2  . gabapentin (NEURONTIN) 300 MG capsule Take 1 capsule (300 mg total) by mouth at bedtime. 90 capsule 1  . lisinopril (PRINIVIL,ZESTRIL) 5 MG tablet Take 1 tablet (5 mg total) by mouth daily. 90 tablet 3  . loratadine (CLARITIN) 10 MG tablet Take 10 mg by mouth daily.    . Memantine HCl-Donepezil HCl (NAMZARIC) 28-10 MG CP24 Take 1 tablet by mouth daily. 90 capsule 3  . pantoprazole (  PROTONIX) 40 MG tablet Take 1 tablet (40 mg total) by mouth daily. 90 tablet 3  . traMADol (ULTRAM) 50 MG tablet take 1 tablet by mouth three times a day 90 tablet 5  . triamcinolone (NASACORT AQ) 55 MCG/ACT AERO nasal inhaler Place 2 sprays into the nose daily. 1 Inhaler 12  . [DISCONTINUED] fluticasone (FLONASE) 50 MCG/ACT nasal spray Place 2 sprays into the nose daily.       No current facility-administered medications on file prior to visit.    Review of Systems  Constitutional: Negative  for other unusual diaphoresis or sweats HENT: Negative for ear discharge or swelling Eyes: Negative for other worsening visual disturbances Respiratory: Negative for stridor or other swelling  Gastrointestinal: Negative for worsening distension or other blood Genitourinary: Negative for retention or other urinary change Musculoskeletal: Negative for other MSK pain or swelling Skin: Negative for color change or other new lesions Neurological: Negative for worsening tremors and other numbness  Psychiatric/Behavioral: Negative for worsening agitation or other fatigue All other system neg per pt    Objective:   Physical Exam BP (!) 142/86   Pulse 76   Ht 5\' 8"  (1.727 m)   Wt 136 lb (61.7 kg)   SpO2 97%   BMI 20.68 kg/m  VS noted, not ill appearing, but has some tender pain to right mid lateral chest approx t9-t11 Constitutional: Pt appears in NAD HENT: Head: NCAT.  Right Ear: External ear normal.  Left Ear: External ear normal.  Eyes: . Pupils are equal, round, and reactive to light. Conjunctivae and EOM are normal Nose: without d/c or deformity Neck: Neck supple. Gross normal ROM Cardiovascular: Normal rate and regular rhythm.   Pulmonary/Chest: Effort normal and breath sounds without rales or wheezing.  Abd:  Soft, NT, ND, + BS, no organomegaly Neurological: Pt is alert. At baseline orientation, motor grossly intact Skin: Skin is warm. No rashes, other new lesions, no LE edema but there is an abrasion to the right side linearly from the site of point of impact (which has no swelling or brusing) downward about 6 cm, no worsening red, tender, swelling or d/c Psychiatric: Pt behavior is normal without agitation  No other exam findings  Lab Results  Component Value Date   WBC 8.4 04/04/2016   HGB 14.6 04/04/2016   HCT 44.1 04/04/2016   PLT 186 04/04/2016   GLUCOSE 108 (H) 04/04/2016   CHOL 230 (H) 09/28/2015   TRIG 101.0 09/28/2015   HDL 98.20 09/28/2015   LDLDIRECT 84.2  01/14/2013   LDLCALC 112 (H) 09/28/2015   ALT 17 04/04/2016   AST 33 04/04/2016   NA 137 04/04/2016   K 4.0 04/04/2016   CL 100 (L) 04/04/2016   CREATININE 1.15 04/04/2016   BUN 21 (H) 04/04/2016   CO2 29 04/04/2016   TSH 2.39 09/28/2015   PSA 3.65 07/16/2013   INR 0.96 03/14/2015   HGBA1C  04/21/2009    6.0 (NOTE) The ADA recommends the following therapeutic goal for glycemic control related to Hgb A1c measurement: Goal of therapy: <6.5 Hgb A1c  Reference: American Diabetes Association: Clinical Practice Recommendations 2010, Diabetes Care, 2010, 33: (Suppl  1).       Assessment & Plan:

## 2017-02-11 NOTE — Patient Instructions (Addendum)
Please continue all other medications as before, and refills have been done if requested.  Please have the pharmacy call with any other refills you may need.  Please continue your efforts at being more active, low cholesterol diet, and weight control.  You are otherwise up to date with prevention measures today.  Please keep your appointments with your specialists as you may have planned  Please call to let us know the name of the outpatient Physical Therapy facility that you prefer  Please go to the LAB in the Basement (turn left off the elevator) for the tests to be done at your convenience  You will be contacted by phone if any changes need to be made immediately.  Otherwise, you will receive a letter about your results with an explanation, but please check with MyChart first.  Please remember to sign up for MyChart if you have not done so, as this will be important to you in the future with finding out test results, communicating by private email, and scheduling acute appointments online when needed.  Please return in 6 months, or sooner if needed

## 2017-02-13 ENCOUNTER — Telehealth: Payer: Self-pay | Admitting: Internal Medicine

## 2017-02-13 NOTE — Assessment & Plan Note (Addendum)
Chronic persistent ? Worse, with recent fall, for outpatient PT evaluation; also for labs when able to get to lab, daughter to help  Note:  Total time for pt hx, exam, review of record with pt in the room, determination of diagnoses and plan for further eval and tx is > 40 min, with over 50% spent in coordination and counseling of patient

## 2017-02-13 NOTE — Telephone Encounter (Signed)
Pt was unable to have labs done at last visit 5/8  If having even more difficulty with being mobile and incontinence, pt needs to go to ED now for labs and possible dehydration tx

## 2017-02-13 NOTE — Assessment & Plan Note (Signed)
s/p fall with tender ribs but no obvious fx , bruising, for pain control, also rib films, cxr

## 2017-02-13 NOTE — Assessment & Plan Note (Signed)
stable overall by history and exam, recent data reviewed with pt, and pt to continue medical treatment as before,  to f/u any worsening symptoms or concerns BP Readings from Last 3 Encounters:  02/11/17 (!) 142/86  12/05/16 108/62  05/10/16 126/64

## 2017-02-13 NOTE — Telephone Encounter (Signed)
Daughter stated that she will take him to the ED first thing in the morning.

## 2017-02-13 NOTE — Telephone Encounter (Signed)
Daughter states that patient was seen on 5/8.  States that since that appointment patient has continued to loose control of his bladder and bowel.  States that patient is not getting out of the  Bed to be mobil.  Would like to know what Dr.John would suggest the next step would be?  Patient does not have DPR on file.  Will add DPR on patients next appt.

## 2017-02-13 NOTE — Assessment & Plan Note (Signed)
stable overall by history and exam, recent data reviewed with pt, and pt to continue medical treatment as before,  to f/u any worsening symptoms or concerns Lab Results  Component Value Date   LDLCALC 112 (H) 09/28/2015

## 2017-02-14 ENCOUNTER — Emergency Department (HOSPITAL_COMMUNITY): Payer: Medicare Other

## 2017-02-14 ENCOUNTER — Encounter (HOSPITAL_COMMUNITY): Payer: Self-pay | Admitting: Emergency Medicine

## 2017-02-14 ENCOUNTER — Emergency Department (HOSPITAL_COMMUNITY)
Admission: EM | Admit: 2017-02-14 | Discharge: 2017-02-14 | Disposition: A | Discharge status: Home / Self Care | Payer: Medicare Other | Attending: Emergency Medicine | Admitting: Emergency Medicine

## 2017-02-14 DIAGNOSIS — S299XXA Unspecified injury of thorax, initial encounter: Secondary | ICD-10-CM | POA: Diagnosis present

## 2017-02-14 DIAGNOSIS — I252 Old myocardial infarction: Secondary | ICD-10-CM | POA: Diagnosis not present

## 2017-02-14 DIAGNOSIS — Z79899 Other long term (current) drug therapy: Secondary | ICD-10-CM | POA: Insufficient documentation

## 2017-02-14 DIAGNOSIS — Y999 Unspecified external cause status: Secondary | ICD-10-CM | POA: Insufficient documentation

## 2017-02-14 DIAGNOSIS — Y92009 Unspecified place in unspecified non-institutional (private) residence as the place of occurrence of the external cause: Secondary | ICD-10-CM | POA: Diagnosis not present

## 2017-02-14 DIAGNOSIS — Y939 Activity, unspecified: Secondary | ICD-10-CM | POA: Insufficient documentation

## 2017-02-14 DIAGNOSIS — Z87891 Personal history of nicotine dependence: Secondary | ICD-10-CM | POA: Insufficient documentation

## 2017-02-14 DIAGNOSIS — R296 Repeated falls: Secondary | ICD-10-CM | POA: Insufficient documentation

## 2017-02-14 DIAGNOSIS — W19XXXA Unspecified fall, initial encounter: Secondary | ICD-10-CM | POA: Insufficient documentation

## 2017-02-14 DIAGNOSIS — Z7982 Long term (current) use of aspirin: Secondary | ICD-10-CM | POA: Insufficient documentation

## 2017-02-14 DIAGNOSIS — I2510 Atherosclerotic heart disease of native coronary artery without angina pectoris: Secondary | ICD-10-CM | POA: Diagnosis not present

## 2017-02-14 DIAGNOSIS — S29019A Strain of muscle and tendon of unspecified wall of thorax, initial encounter: Secondary | ICD-10-CM

## 2017-02-14 DIAGNOSIS — Z8546 Personal history of malignant neoplasm of prostate: Secondary | ICD-10-CM | POA: Insufficient documentation

## 2017-02-14 DIAGNOSIS — S29012A Strain of muscle and tendon of back wall of thorax, initial encounter: Secondary | ICD-10-CM | POA: Insufficient documentation

## 2017-02-14 DIAGNOSIS — I10 Essential (primary) hypertension: Secondary | ICD-10-CM | POA: Diagnosis not present

## 2017-02-14 DIAGNOSIS — S20221A Contusion of right back wall of thorax, initial encounter: Secondary | ICD-10-CM

## 2017-02-14 LAB — HEPATIC FUNCTION PANEL
ALBUMIN: 3.4 g/dL — AB (ref 3.5–5.0)
ALT: 17 U/L (ref 17–63)
AST: 41 U/L (ref 15–41)
Alkaline Phosphatase: 87 U/L (ref 38–126)
BILIRUBIN TOTAL: 0.6 mg/dL (ref 0.3–1.2)
Bilirubin, Direct: 0.2 mg/dL (ref 0.1–0.5)
Indirect Bilirubin: 0.4 mg/dL (ref 0.3–0.9)
Total Protein: 7.3 g/dL (ref 6.5–8.1)

## 2017-02-14 LAB — BASIC METABOLIC PANEL
ANION GAP: 8 (ref 5–15)
BUN: 25 mg/dL — ABNORMAL HIGH (ref 6–20)
CHLORIDE: 103 mmol/L (ref 101–111)
CO2: 28 mmol/L (ref 22–32)
Calcium: 9 mg/dL (ref 8.9–10.3)
Creatinine, Ser: 1.18 mg/dL (ref 0.61–1.24)
GFR calc Af Amer: 59 mL/min — ABNORMAL LOW (ref >60)
GFR, EST NON AFRICAN AMERICAN: 51 mL/min — AB (ref >60)
Glucose, Bld: 116 mg/dL — ABNORMAL HIGH (ref 65–99)
POTASSIUM: 3.5 mmol/L (ref 3.5–5.1)
SODIUM: 139 mmol/L (ref 135–145)

## 2017-02-14 LAB — URINALYSIS, ROUTINE W REFLEX MICROSCOPIC
BACTERIA UA: NONE SEEN
BILIRUBIN URINE: NEGATIVE
GLUCOSE, UA: NEGATIVE mg/dL
KETONES UR: 5 mg/dL — AB
LEUKOCYTES UA: NEGATIVE
NITRITE: NEGATIVE
Protein, ur: 30 mg/dL — AB
SPECIFIC GRAVITY, URINE: 1.019 (ref 1.005–1.030)
SQUAMOUS EPITHELIAL / LPF: NONE SEEN
pH: 6 (ref 5.0–8.0)

## 2017-02-14 LAB — CBC
HEMATOCRIT: 41.9 % (ref 39.0–52.0)
Hemoglobin: 13.9 g/dL (ref 13.0–17.0)
MCH: 33 pg (ref 26.0–34.0)
MCHC: 33.2 g/dL (ref 30.0–36.0)
MCV: 99.5 fL (ref 78.0–100.0)
Platelets: 169 10*3/uL (ref 150–400)
RBC: 4.21 MIL/uL — ABNORMAL LOW (ref 4.22–5.81)
RDW: 14.1 % (ref 11.5–15.5)
WBC: 6.8 10*3/uL (ref 4.0–10.5)

## 2017-02-14 LAB — TROPONIN I: Troponin I: <0.03 ng/mL (ref <0.03)

## 2017-02-14 MED ORDER — HYDROCODONE-ACETAMINOPHEN 5-325 MG PO TABS
1.0000 | ORAL_TABLET | Freq: Once | ORAL | Status: AC
  Administered 2017-02-14: 1 via ORAL
  Filled 2017-02-14: qty 1

## 2017-02-14 MED ORDER — IOPAMIDOL (ISOVUE-300) INJECTION 61%
INTRAVENOUS | Status: AC
  Administered 2017-02-14: 100 mL
  Filled 2017-02-14: qty 100

## 2017-02-14 MED ORDER — DOCUSATE SODIUM 100 MG PO CAPS: 100.0000 mg | ORAL_CAPSULE | Freq: Two times a day (BID) | ORAL | 0 refills | Status: DC

## 2017-02-14 MED ORDER — HYDROCODONE-ACETAMINOPHEN 5-325 MG PO TABS: 1.0000 | ORAL_TABLET | ORAL | 0 refills | Status: DC | PRN

## 2017-02-14 NOTE — ED Provider Notes (Signed)
5:37 PM Care assumed from Dr. Johnney Killian.  At time of transfer patient is awaiting social work disposition.   Social work team got in contact with the patient and family. After discussion, family agrees for home health services. Patient had face-to-face order placed for home health services. Patient and family agreed with plan of care and agreed with discharge.  Patient was discharged in good condition with understanding of follow-up instructions with PCP as well as return precautions for any new or worsened symptoms. Patient discharged in good condition.  Clinical Impression: 1. Contusion of right back wall of thorax, initial encounter   2. Thoracic myofascial strain, initial encounter   3. Frequent falls     Disposition: Discharge  Condition: Good  I have discussed the results, Dx and Tx plan with the pt(& family if present). He/she/they expressed understanding and agree(s) with the plan. Discharge instructions discussed at great length. Strict return precautions discussed and pt &/or family have verbalized understanding of the instructions. No further questions at time of discharge.    Discharge Medication List as of 02/14/2017  7:31 PM    START taking these medications   Details  docusate sodium (COLACE) 100 MG capsule Take 1 capsule (100 mg total) by mouth every 12 (twelve) hours., Starting Fri 02/14/2017, Print    HYDROcodone-acetaminophen (NORCO/VICODIN) 5-325 MG tablet Take 1-2 tablets by mouth every 4 (four) hours as needed for moderate pain or severe pain., Starting Fri 02/14/2017, Print        Follow Up: Biagio Borg, MD [address] [place] [address] [phone]  Schedule an appointment as soon as possible for a visit   [facility] [place] [id number] [place] [phone] Follow up If symptoms worsen  Health, [place]  [phone]  Follow up Home Health Service RN, Physical Therpay , Occupational Therapy     Tegeler, Gwenyth Allegra, MD 02/15/17 1714

## 2017-02-14 NOTE — Progress Notes (Signed)
CSW and RN Case Manager spoke with Patient's daughter. Patient's daughter agreeable to home health services. RN Case Manager actively working on home health services. CSW signing off. Please contact should new need(s) arise.    Lorrine Kin, MSW, LCSW [facility] ED/31M Clinical Social Worker [phone]

## 2017-02-14 NOTE — ED Notes (Signed)
Daughter adds that patient hit his right side on bookshelf with one fall. MD notified. Pt denies any breathing difficulties.

## 2017-02-14 NOTE — ED Provider Notes (Signed)
[facility] DEPT Provider Note   CSN: [id number] Arrival date & time: 02/14/17  1147     History   Chief Complaint Chief Complaint  Patient presents with  . Fall  . Weakness    HPI Eric Cooley is a 83 y.o. male.  HPI Patient fell in his home 6 days ago. He landed such that the right lower rib cage and flank hip, book case fairly hard. He had a lot of pain in the area. He was seen by his doctor on Monday. They had planned to do x-rays and lab work with the patient did not get them done. Given tramadol to take for pain. Patient reports the pain has continued to worsen significantly in the right lower rib cage. He also took a second fall and has been more weak over the past 2 days. He has not had a couple of episodes of urinary incontinence. He has not had a bowel movement since the fall. Past Medical History:  Diagnosis Date  . Allergic rhinitis, cause unspecified 03/29/2011  . Anemia 05/2009   blood loss anemia, transfused 6 units.   . Arthritis   . BPH (benign prostatic hypertrophy) 03/29/2011  . CAD (coronary artery disease) 05/2012  . Colon polyp 05/2012   cecal, ascending tubular adenomas.   . DJD (degenerative joint disease), lumbar 04/01/2011  . Gastrointestinal stromal tumor (GIST) of stomach ([place]) 05/2009, 10/2009   2010: gastric ulcer with GIST: IR embolization left gastric artery.  2011: partial gastrectomy  . GERD (gastroesophageal reflux disease) 03/29/2011  . History of nuclear stress test 03/27/2010   exercise; no ischemic changes, normal study   . HTN (hypertension) 03/29/2011  . Hyperlipidemia 03/29/2011  . Hypertension   . Low back pain   . MI (myocardial infarction) ([place])   . Prostate cancer ([place]) 03/29/2011  . S/P cholecystectomy 03/29/2011  . 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 Urine incontinence     Patient Active Problem List   Diagnosis Date Noted  . Bilateral leg pain 12/05/2016  . Right shoulder pain 12/05/2016    . CAP (community acquired pneumonia) 04/11/2016  . 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 Right flank pain, chronic 06/08/2015  . 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 Thoracic scoliosis 03/30/2015  . Facet syndrome, lumbar ([place]) 03/30/2015  . Myofascial pain on right side 03/30/2015  . Mild dementia 03/14/2015  . Abnormal LFTs   . Chest pain 03/10/2015  . Left shoulder pain 02/01/2015  . Pleuritic chest pain 02/01/2015  . Abnormal CXR 12/16/2014  . Chronic back pain 12/16/2014  . Unsteady gait 12/02/2014  . Memory loss 07/19/2013  . Loss of weight 07/16/2013  . Chest pain with low risk for cardiac etiology 07/12/2013  . Protein-calorie malnutrition, severe ([place]) 07/12/2013  . Subacute confusional state 05/28/2013  . Gait disorder 05/28/2013  . Subarachnoid hemorrhage following injury ([place]) 04/16/2013  . Shoulder dislocation-right 04/16/2013  . Intermittent low back pain 04/15/2012  . Constipation 03/04/2012  . Dysuria 03/04/2012  . Pneumonia 01/29/2012  . Dizziness 12/18/2011  . Balance problems 12/18/2011  . Pre-ulcerative corn or callous 10/13/2011  . DJD (degenerative joint disease), lumbar 04/01/2011  . CAD (coronary artery disease) 03/29/2011  . Prostate cancer ([place]) 03/29/2011  . 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 HTN (hypertension) 03/29/2011  . Hyperlipidemia 03/29/2011  . BPH (benign prostatic hypertrophy) 03/29/2011  . GERD (gastroesophageal reflux disease) 03/29/2011  . Preventative health care 03/29/2011  .  S/P cholecystectomy 03/29/2011  . Allergic rhinitis 03/29/2011  . S/P partial gastrectomy 03/29/2011  . Anemia 03/29/2011    Past Surgical History:  Procedure Laterality Date  . CHOLECYSTECTOMY  1986  . CORONARY ANGIOPLASTY WITH STENT PLACEMENT  04/24/2009   2 Xience DES to LAD 3.5x65mm and 3.0x3mm overlapping  . gastric artery emoblization Left 05/2009  . PARTIAL GASTRECTOMY  10/2009   GIST  .  TONSILLECTOMY  1951  . TRANSTHORACIC ECHOCARDIOGRAM  10/09/2009   EF 55-60%; normaml LV systolic function with grade 1 diastolic dysfunction       Home Medications    Prior to Admission medications   Medication Sig Start Date End Date Taking? Authorizing Provider  acetaminophen (TYLENOL) 500 MG tablet Take 500 mg by mouth every 6 (six) hours as needed (pain).   Yes [provider]  fexofenadine (ALLEGRA) 180 MG tablet Take 1 tablet (180 mg total) by mouth daily. 12/05/16 12/05/17 Yes Biagio Borg, MD  Memantine HCl-Donepezil HCl [facility]) 28-10 MG CP24 Take 1 tablet by mouth daily. 05/14/16  Yes Biagio Borg, MD  traMADol Veatrice Bourbon) 50 MG tablet take 1 tablet by mouth three times a day 11/28/16  Yes Biagio Borg, MD  aspirin 81 MG EC tablet Take 1 tablet (81 mg total) by mouth daily. Patient not taking: Reported on 02/14/2017 05/04/13   Erby Pian, NP  gabapentin (NEURONTIN) 300 MG capsule Take 1 capsule (300 mg total) by mouth at bedtime. Patient not taking: Reported on 02/14/2017 12/05/16   Biagio Borg, MD  lisinopril (PRINIVIL,ZESTRIL) 5 MG tablet Take 1 tablet (5 mg total) by mouth daily. Patient not taking: Reported on 02/14/2017 11/11/14   Biagio Borg, MD  pantoprazole (PROTONIX) 40 MG tablet Take 1 tablet (40 mg total) by mouth daily. Patient not taking: Reported on 02/14/2017 03/20/16   Biagio Borg, MD    Family History Family History  Problem Relation Age of Onset  . Arthritis Mother   . Ovarian cancer Mother   . Heart failure Mother   . Arthritis Father   . Lung cancer Father   . Colon cancer Neg Hx     Social History Social History  Substance Use Topics  . Smoking status: Former Smoker    Quit date: 10/07/1960  . Smokeless tobacco: Never Used     Comment: Stopped smoking in 1962  . Alcohol use 0.6 oz/week    1 Glasses of wine per week     Comment: very rare     Allergies   Patient has no known allergies.   Review of Systems Review of Systems 10  Systems reviewed and are negative for acute change except as noted in the HPI.   Physical Exam Updated Vital Signs BP 138/68   Pulse 80   Temp 98.3 F (36.8 C) (Oral)   Resp (!) 29   Ht 5\' 8"  (1.727 m)   Wt 136 lb (61.7 kg)   SpO2 96%   BMI 20.68 kg/m   Physical Exam  Constitutional: He is oriented to person, place, and time.  Patient is alert and nontoxic. No respiratory distress at rest. He does appear to be in severe pain with certain movements.  HENT:  Head: Normocephalic and atraumatic.  Right Ear: External ear normal.  Left Ear: External ear normal.  Nose: Nose normal.  Mouth/Throat: Oropharynx is clear and moist.  Eyes: EOM are normal. Pupils are equal, round, and reactive to light.  Neck:  No C-spine tenderness to  palpation.  Cardiovascular:  Distant heart sounds. Regular. Distal pulses 2+.  Pulmonary/Chest: Effort normal and breath sounds normal.  Significant chest wall pain to pressure lower right ribs 11 and 12 anterior axillary and mid axillary line.  Abdominal: Soft. Bowel sounds are normal. He exhibits no distension. There is no tenderness. There is no guarding.  Musculoskeletal:  Contusion to the lower right back approximately 10 cm x 2 cm. Moderate tenderness. Range of motion intact for lower extremity. Patient does have slight chronic venous stasis. No acute injuries. Chronic swelling of the left ankle. The patient has intact function range of motion of upper extremities bilaterally.  Neurological: He is alert and oriented to person, place, and time. No cranial nerve deficit. He exhibits normal muscle tone. Coordination normal.  Skin: Skin is warm and dry.  Psychiatric: He has a normal mood and affect.     ED Treatments / Results  Labs (all labs ordered are listed, but only abnormal results are displayed) Labs Reviewed  BASIC METABOLIC PANEL - Abnormal; Notable for the following:       Result Value   Glucose, Bld 116 (*)    BUN 25 (*)    GFR calc non  Af Amer 51 (*)    GFR calc Af Amer 59 (*)    All other components within normal limits  CBC - Abnormal; Notable for the following:    RBC 4.21 (*)    All other components within normal limits  URINALYSIS, ROUTINE W REFLEX MICROSCOPIC - Abnormal; Notable for the following:    Hgb urine dipstick SMALL (*)    Ketones, ur 5 (*)    Protein, ur 30 (*)    All other components within normal limits  HEPATIC FUNCTION PANEL - Abnormal; Notable for the following:    Albumin 3.4 (*)    All other components within normal limits  URINE CULTURE  TROPONIN I    EKG  EKG Interpretation  Date/Time:  Friday Feb 14 2017 12:18:46 EDT Ventricular Rate:  79 PR Interval:    QRS Duration: 88 QT Interval:  366 QTC Calculation: 420 R Axis:   62 Text Interpretation:  Sinus rhythm Atrial premature complexes Baseline wander in lead(s) V4 V6 agree. no sig change from previous Confirmed by Johnney Killian, MD, Jeannie Done [phone]) on 02/14/2017 12:27:49 PM Also confirmed by Johnney Killian, MD, Jeannie Done [phone]), editor Drema Pry [phone])  on 02/14/2017 1:43:29 PM       Radiology Ct Chest W Contrast  Result Date: 02/14/2017 CLINICAL DATA:  [place] twice in the past few days. Episode of incontinence yesterday EXAM: CT CHEST, ABDOMEN, AND PELVIS WITH CONTRAST TECHNIQUE: Multidetector CT imaging of the chest, abdomen and pelvis was performed following the standard protocol during bolus administration of intravenous contrast. CONTRAST:  117mL ISOVUE-300 IOPAMIDOL (ISOVUE-300) INJECTION 61% COMPARISON:  Relevant previous examinations. FINDINGS: CT CHEST FINDINGS Cardiovascular: Coronary artery stents. Normal sized heart. Atheromatous aortic calcifications. Mediastinum/Nodes: No enlarged mediastinal, hilar, or axillary lymph nodes. Thyroid gland, trachea, and esophagus demonstrate no significant findings. Lungs/Pleura: Mild diffuse peribronchial thickening. Minimal bibasilar atelectasis. No pleural fluid. Musculoskeletal: Thoracic spine  degenerative changes.  No fractures. CT ABDOMEN PELVIS FINDINGS Hepatobiliary: Previously demonstrated small liver cysts. Surgically absent gallbladder with stable post cholecystectomy biliary dilatation. Pancreas: Unremarkable. No pancreatic ductal dilatation or surrounding inflammatory changes. Spleen: Normal in size without focal abnormality. Adrenals/Urinary Tract: Small bilateral renal cysts. Mild diffuse bladder wall thickening. Normal appearing adrenal glands. Stomach/Bowel: The previously seen gastric mass has been resected with associated postsurgical  staples. Unremarkable small bowel and colon. No evidence of appendicitis. Vascular/Lymphatic: Atheromatous arterial calcifications without aneurysm. No enlarged lymph nodes. Reproductive: Mildly enlarged prostate gland. Other: Bilateral inguinal hernias containing fat. Musculoskeletal: Lumbar spine degenerative changes. IMPRESSION: 1. No acute abnormality. 2. Mild diffuse bladder wall thickening, most likely due to chronic bladder outlet obstruction by the mildly enlarged prostate gland. 3. Aortic atherosclerosis. Electronically Signed   By: Claudie Revering M.D.   On: 02/14/2017 14:27   Ct Abdomen Pelvis W Contrast  Result Date: 02/14/2017 CLINICAL DATA:  [place] twice in the past few days. Episode of incontinence yesterday EXAM: CT CHEST, ABDOMEN, AND PELVIS WITH CONTRAST TECHNIQUE: Multidetector CT imaging of the chest, abdomen and pelvis was performed following the standard protocol during bolus administration of intravenous contrast. CONTRAST:  122mL ISOVUE-300 IOPAMIDOL (ISOVUE-300) INJECTION 61% COMPARISON:  Relevant previous examinations. FINDINGS: CT CHEST FINDINGS Cardiovascular: Coronary artery stents. Normal sized heart. Atheromatous aortic calcifications. Mediastinum/Nodes: No enlarged mediastinal, hilar, or axillary lymph nodes. Thyroid gland, trachea, and esophagus demonstrate no significant findings. Lungs/Pleura: Mild diffuse peribronchial  thickening. Minimal bibasilar atelectasis. No pleural fluid. Musculoskeletal: Thoracic spine degenerative changes.  No fractures. CT ABDOMEN PELVIS FINDINGS Hepatobiliary: Previously demonstrated small liver cysts. Surgically absent gallbladder with stable post cholecystectomy biliary dilatation. Pancreas: Unremarkable. No pancreatic ductal dilatation or surrounding inflammatory changes. Spleen: Normal in size without focal abnormality. Adrenals/Urinary Tract: Small bilateral renal cysts. Mild diffuse bladder wall thickening. Normal appearing adrenal glands. Stomach/Bowel: The previously seen gastric mass has been resected with associated postsurgical staples. Unremarkable small bowel and colon. No evidence of appendicitis. Vascular/Lymphatic: Atheromatous arterial calcifications without aneurysm. No enlarged lymph nodes. Reproductive: Mildly enlarged prostate gland. Other: Bilateral inguinal hernias containing fat. Musculoskeletal: Lumbar spine degenerative changes. IMPRESSION: 1. No acute abnormality. 2. Mild diffuse bladder wall thickening, most likely due to chronic bladder outlet obstruction by the mildly enlarged prostate gland. 3. Aortic atherosclerosis. Electronically Signed   By: Claudie Revering M.D.   On: 02/14/2017 14:27    Procedures Procedures (including critical care time)  Medications Ordered in ED Medications  iopamidol (ISOVUE-300) 61 % injection (100 mLs  Contrast Given 02/14/17 1400)  HYDROcodone-acetaminophen (NORCO/VICODIN) 5-325 MG per tablet 1 tablet (1 tablet Oral Given 02/14/17 1546)     Initial Impression / Assessment and Plan / ED Course  I have reviewed the triage vital signs and the nursing notes.  Pertinent labs & imaging results that were available during my care of the patient were reviewed by me and considered in my medical decision making (see chart for details).     Consult: Hospitalist, at this time we reviewed the patient's case and no admission criteria are  identified.  Final Clinical Impressions(s) / ED Diagnoses   Final diagnoses:  Contusion of right back wall of thorax, initial encounter  Thoracic myofascial strain, initial encounter  Frequent falls   Patient has fallen in his home. He does have thoracic pain but is alert and appropriate. CT does not show any intrathoracic or intra-abdominal acute injuries. Diagnostic labs are within normal limits. Patient's daughter does have concerns for the patient's falling in the home. She is a limited in her ability to physically manage the patient. At this time, consultation with the hospitalist service appears the patient does not meet criteria for inpatient therapy. Plan will be to consult social work to determine if patient has potential for skilled nursing\rehabilitation placement or in-home care assistance. Pain management will be with Vicodin when necessary and Colace to avoid constipation. Dr. Gustavus Messing  will review final plan with social work for final disposition. New Prescriptions New Prescriptions   No medications on file     Charlesetta Shanks, MD 02/14/17 1727

## 2017-02-14 NOTE — Care Management Note (Signed)
Case Management Note  Patient Details  Name: Eric Cooley MRN: [id number] Date of Birth: 23-Aug-1920  Subjective/Objective:             Patient presented to [facility] ED with c/o weakness x 5 days        Action/Plan: CM spoke with daughter Gregary Signs by phone [phone]) CM explained there no acute findings on ED evaluations and discussed HH recommendations. Daughter is agreeable with care transition plan. Offered choice [facility] selected, Denies any equipment need. CM faxed referral to [facility] [phone] via [facility]. CM explained that a nurse from [facility] will contact her 24- 48 hours post discharge. [facility] contact information also provided. No further questions or concerns verbalized at this time.   Expected Discharge Date:    02/14/2017              Expected Discharge Plan:  [place]  In-House Referral:     Discharge planning Services  CM Consult  Post Acute Care Choice:    Choice offered to:  Adult Children patient.   DME Arranged:  N/A DME Agency:     HH Arranged:  RN, PT, OT, Social Work [employer] Agency:  [facility]  Status of Service:  Completed, signed off  If discussed at [employer], dates discussed:    Additional CommentsLaurena Slimmer, RN 02/14/2017, 10:01 PM

## 2017-02-14 NOTE — ED Notes (Signed)
This RN spoke with social work and Best [facility]. Report that patient will be receiving home health at discharge.

## 2017-02-14 NOTE — ED Notes (Signed)
Pt has a urinal at bedside and is aware that a urine sample is needed

## 2017-02-14 NOTE — ED Triage Notes (Signed)
Family reports pt has had generalized weakness since sunday. Fell Sunday and Monday. Had an episode of urinary incontinence yesterday.

## 2017-02-14 NOTE — ED Notes (Signed)
Patient given urinal. Made aware that a urine sample is needed.

## 2017-02-15 LAB — URINE CULTURE

## 2017-02-27 ENCOUNTER — Telehealth: Payer: Self-pay | Admitting: Internal Medicine

## 2017-02-27 NOTE — Telephone Encounter (Signed)
Eric Cooley was given the verbal orders for speech therapy and will be putting it in.

## 2017-02-27 NOTE — Telephone Encounter (Signed)
Rosann Auerbach (Occupational Therapist with [facility]) called and said that she did an evaluation with the pt. She is requesting that a referral be approved for a speech therapist due to cognitive issues. Please advise.

## 2017-02-27 NOTE — Telephone Encounter (Signed)
OK, is verbal OK?

## 2017-03-04 DIAGNOSIS — E785 Hyperlipidemia, unspecified: Secondary | ICD-10-CM | POA: Diagnosis not present

## 2017-03-04 DIAGNOSIS — S29019D Strain of muscle and tendon of unspecified wall of thorax, subsequent encounter: Secondary | ICD-10-CM

## 2017-03-04 DIAGNOSIS — I10 Essential (primary) hypertension: Secondary | ICD-10-CM | POA: Diagnosis not present

## 2017-03-04 DIAGNOSIS — I252 Old myocardial infarction: Secondary | ICD-10-CM | POA: Diagnosis not present

## 2017-03-04 DIAGNOSIS — N400 Enlarged prostate without lower urinary tract symptoms: Secondary | ICD-10-CM

## 2017-03-04 DIAGNOSIS — R32 Unspecified urinary incontinence: Secondary | ICD-10-CM

## 2017-03-04 DIAGNOSIS — I2510 Atherosclerotic heart disease of native coronary artery without angina pectoris: Secondary | ICD-10-CM | POA: Diagnosis not present

## 2017-03-04 DIAGNOSIS — S20221D Contusion of right back wall of thorax, subsequent encounter: Secondary | ICD-10-CM | POA: Diagnosis not present

## 2017-03-04 DIAGNOSIS — Z8546 Personal history of malignant neoplasm of prostate: Secondary | ICD-10-CM | POA: Diagnosis not present

## 2017-03-04 DIAGNOSIS — Z79891 Long term (current) use of opiate analgesic: Secondary | ICD-10-CM

## 2017-03-04 DIAGNOSIS — Z87891 Personal history of nicotine dependence: Secondary | ICD-10-CM

## 2017-03-04 DIAGNOSIS — R296 Repeated falls: Secondary | ICD-10-CM

## 2017-03-04 NOTE — Telephone Encounter (Signed)
Ok for verbals 

## 2017-03-04 NOTE — Telephone Encounter (Signed)
Left VM for Ebony Hail with verbal orders.

## 2017-03-04 NOTE — Telephone Encounter (Signed)
Ebony Hail, a speech therapists, [facility] [phone]  Needs verbals for speech therapists 1x1, 2x1 starting next week.

## 2017-03-17 ENCOUNTER — Telehealth: Payer: Self-pay | Admitting: Internal Medicine

## 2017-03-17 NOTE — Telephone Encounter (Signed)
Needs verbals to continue care for pain management and wound care for 1x6 and 2PRNs

## 2017-03-18 NOTE — Telephone Encounter (Signed)
Ok for verbals 

## 2017-03-18 NOTE — Telephone Encounter (Signed)
Lauren was given the verbal orders

## 2017-05-04 ENCOUNTER — Encounter (HOSPITAL_COMMUNITY): Payer: Self-pay

## 2017-05-04 ENCOUNTER — Emergency Department (HOSPITAL_COMMUNITY)
Admission: EM | Admit: 2017-05-04 | Discharge: 2017-05-04 | Disposition: A | Discharge status: Home / Self Care | Payer: Medicare Other | Attending: Emergency Medicine | Admitting: Emergency Medicine

## 2017-05-04 DIAGNOSIS — Z7982 Long term (current) use of aspirin: Secondary | ICD-10-CM | POA: Diagnosis not present

## 2017-05-04 DIAGNOSIS — F0390 Unspecified dementia without behavioral disturbance: Secondary | ICD-10-CM | POA: Diagnosis not present

## 2017-05-04 DIAGNOSIS — Z79899 Other long term (current) drug therapy: Secondary | ICD-10-CM | POA: Diagnosis not present

## 2017-05-04 DIAGNOSIS — M549 Dorsalgia, unspecified: Secondary | ICD-10-CM | POA: Insufficient documentation

## 2017-05-04 DIAGNOSIS — I10 Essential (primary) hypertension: Secondary | ICD-10-CM | POA: Diagnosis not present

## 2017-05-04 DIAGNOSIS — Z87891 Personal history of nicotine dependence: Secondary | ICD-10-CM | POA: Insufficient documentation

## 2017-05-04 DIAGNOSIS — I2510 Atherosclerotic heart disease of native coronary artery without angina pectoris: Secondary | ICD-10-CM | POA: Insufficient documentation

## 2017-05-04 DIAGNOSIS — Z955 Presence of coronary angioplasty implant and graft: Secondary | ICD-10-CM | POA: Diagnosis not present

## 2017-05-04 DIAGNOSIS — G8929 Other chronic pain: Secondary | ICD-10-CM

## 2017-05-04 MED ORDER — METHOCARBAMOL 500 MG PO TABS: 500.0000 mg | ORAL_TABLET | Freq: Four times a day (QID) | ORAL | 0 refills | Status: DC | PRN

## 2017-05-04 MED ORDER — METHOCARBAMOL 1000 MG/10ML IJ SOLN
500.0000 mg | Freq: Once | INTRAVENOUS | Status: AC
  Administered 2017-05-04: 500 mg via INTRAVENOUS
  Filled 2017-05-04: qty 550

## 2017-05-04 MED ORDER — KETOROLAC TROMETHAMINE 15 MG/ML IJ SOLN
15.0000 mg | Freq: Once | INTRAMUSCULAR | Status: AC
  Administered 2017-05-04: 15 mg via INTRAVENOUS
  Filled 2017-05-04: qty 1

## 2017-05-04 MED ORDER — HYDROCODONE-ACETAMINOPHEN 5-325 MG PO TABS: 1.0000 | ORAL_TABLET | ORAL | 0 refills | Status: DC | PRN

## 2017-05-04 MED ORDER — NAPROXEN 375 MG PO TABS: 375.0000 mg | ORAL_TABLET | Freq: Two times a day (BID) | ORAL | 0 refills | Status: DC

## 2017-05-04 NOTE — ED Provider Notes (Signed)
[facility] DEPT Provider Note   CSN: [id number] Arrival date & time: 05/04/17  0442     History   Chief Complaint Chief Complaint  Patient presents with  . Back Pain    HPI Eric Cooley is a 83 y.o. male.  The history is provided by the patient.  He has been complaining of pain throughout his back for an undetermined amount of time, but it got significantly worse over the last several days. He denies any trauma or unusual activity. He has been taking tramadol, which initially had been giving him relief. Over the last 2 days, it has stopped giving him relief. He states pain is worse when he tries to get up off of the commode. He is unable to assign a number to his pain. He denies any new numbness or weakness. Denies bowel or bladder dysfunction.   Past Medical History:  Diagnosis Date  . Allergic rhinitis, cause unspecified 03/29/2011  . Anemia 05/2009   blood loss anemia, transfused 6 units.   . Arthritis   . BPH (benign prostatic hypertrophy) 03/29/2011  . CAD (coronary artery disease) 05/2012  . Colon polyp 05/2012   cecal, ascending tubular adenomas.   . DJD (degenerative joint disease), lumbar 04/01/2011  . Gastrointestinal stromal tumor (GIST) of stomach ([place]) 05/2009, 10/2009   2010: gastric ulcer with GIST: IR embolization left gastric artery.  2011: partial gastrectomy  . GERD (gastroesophageal reflux disease) 03/29/2011  . History of nuclear stress test 03/27/2010   exercise; no ischemic changes, normal study   . HTN (hypertension) 03/29/2011  . Hyperlipidemia 03/29/2011  . Hypertension   . Low back pain   . MI (myocardial infarction) ([place])   . Prostate cancer ([place]) 03/29/2011  . S/P cholecystectomy 03/29/2011  . 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 Urine incontinence     Patient Active Problem List   Diagnosis Date Noted  . Bilateral leg pain 12/05/2016  . Right shoulder pain 12/05/2016  . CAP (community acquired pneumonia)  04/11/2016  . 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 Right flank pain, chronic 06/08/2015  . 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 Thoracic scoliosis 03/30/2015  . Facet syndrome, lumbar ([place]) 03/30/2015  . Myofascial pain on right side 03/30/2015  . Mild dementia 03/14/2015  . Abnormal LFTs   . Chest pain 03/10/2015  . Left shoulder pain 02/01/2015  . Pleuritic chest pain 02/01/2015  . Abnormal CXR 12/16/2014  . Chronic back pain 12/16/2014  . Unsteady gait 12/02/2014  . Memory loss 07/19/2013  . Loss of weight 07/16/2013  . Chest pain with low risk for cardiac etiology 07/12/2013  . Protein-calorie malnutrition, severe ([place]) 07/12/2013  . Subacute confusional state 05/28/2013  . Gait disorder 05/28/2013  . Subarachnoid hemorrhage following injury ([place]) 04/16/2013  . Shoulder dislocation-right 04/16/2013  . Intermittent low back pain 04/15/2012  . Constipation 03/04/2012  . Dysuria 03/04/2012  . Pneumonia 01/29/2012  . Dizziness 12/18/2011  . Balance problems 12/18/2011  . Pre-ulcerative corn or callous 10/13/2011  . DJD (degenerative joint disease), lumbar 04/01/2011  . CAD (coronary artery disease) 03/29/2011  . Prostate cancer ([place]) 03/29/2011  . 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 HTN (hypertension) 03/29/2011  . Hyperlipidemia 03/29/2011  . BPH (benign prostatic hypertrophy) 03/29/2011  . GERD (gastroesophageal reflux disease) 03/29/2011  . Preventative health care 03/29/2011  . S/P cholecystectomy 03/29/2011  . Allergic rhinitis 03/29/2011  . S/P partial gastrectomy 03/29/2011  . Anemia 03/29/2011  Past Surgical History:  Procedure Laterality Date  . CHOLECYSTECTOMY  1986  . CORONARY ANGIOPLASTY WITH STENT PLACEMENT  04/24/2009   2 Xience DES to LAD 3.5x13mm and 3.0x86mm overlapping  . gastric artery emoblization Left 05/2009  . PARTIAL GASTRECTOMY  10/2009   GIST  . TONSILLECTOMY  1951  . TRANSTHORACIC  ECHOCARDIOGRAM  10/09/2009   EF 55-60%; normaml LV systolic function with grade 1 diastolic dysfunction       Home Medications    Prior to Admission medications   Medication Sig Start Date End Date Taking? Authorizing Provider  acetaminophen (TYLENOL) 500 MG tablet Take 500 mg by mouth every 6 (six) hours as needed (pain).    [provider]  aspirin 81 MG EC tablet Take 1 tablet (81 mg total) by mouth daily. Patient not taking: Reported on 02/14/2017 05/04/13   Erby Pian, NP  docusate sodium (COLACE) 100 MG capsule Take 1 capsule (100 mg total) by mouth every 12 (twelve) hours. 02/14/17   Charlesetta Shanks, MD  fexofenadine (ALLEGRA) 180 MG tablet Take 1 tablet (180 mg total) by mouth daily. 12/05/16 12/05/17  Biagio Borg, MD  gabapentin (NEURONTIN) 300 MG capsule Take 1 capsule (300 mg total) by mouth at bedtime. Patient not taking: Reported on 02/14/2017 12/05/16   Biagio Borg, MD  HYDROcodone-acetaminophen (NORCO/VICODIN) 5-325 MG tablet Take 1-2 tablets by mouth every 4 (four) hours as needed for moderate pain or severe pain. 02/14/17   Charlesetta Shanks, MD  lisinopril (PRINIVIL,ZESTRIL) 5 MG tablet Take 1 tablet (5 mg total) by mouth daily. Patient not taking: Reported on 02/14/2017 11/11/14   Biagio Borg, MD  Memantine HCl-Donepezil HCl [facility]) 28-10 MG CP24 Take 1 tablet by mouth daily. 05/14/16   Biagio Borg, MD  pantoprazole (PROTONIX) 40 MG tablet Take 1 tablet (40 mg total) by mouth daily. Patient not taking: Reported on 02/14/2017 03/20/16   Biagio Borg, MD  traMADol Veatrice Bourbon) 50 MG tablet take 1 tablet by mouth three times a day 11/28/16   Biagio Borg, MD    Family History Family History  Problem Relation Age of Onset  . Arthritis Mother   . Ovarian cancer Mother   . Heart failure Mother   . Arthritis Father   . Lung cancer Father   . Colon cancer Neg Hx     Social History Social History  Substance Use Topics  . Smoking status: Former Smoker    Quit date:  10/07/1960  . Smokeless tobacco: Never Used     Comment: Stopped smoking in 1962  . Alcohol use 0.6 oz/week    1 Glasses of wine per week     Comment: very rare     Allergies   Patient has no known allergies.   Review of Systems Review of Systems  All other systems reviewed and are negative.    Physical Exam Updated Vital Signs BP (!) 150/75 (BP Location: Left Arm)   Pulse 77   Temp 98.2 F (36.8 C) (Oral)   Resp 18   SpO2 94%   Physical Exam  Nursing note and vitals reviewed.  83 year old male, resting comfortably and in no acute distress. Vital signs are significant for hypertension. Oxygen saturation is 94%, which is normal. Head is normocephalic and atraumatic. PERRLA, EOMI. Oropharynx is clear. Neck is nontender and supple without adenopathy or JVD. Back has mild tenderness in the lower thoracic and upper lumbar area. There is moderate bilateral paraspinal muscle spasm. Straight  leg raises positive on the left at 45, on the right at 60. There is no CVA tenderness. Lungs are clear without rales, wheezes, or rhonchi. Chest is nontender. Heart has regular rate and rhythm without murmur. Abdomen is soft, flat, nontender without masses or hepatosplenomegaly and peristalsis is normoactive. Extremities have no cyanosis or edema, full range of motion is present. Skin is warm and dry without rash. Neurologic: Mental status is normal, cranial nerves are intact, there are no motor or sensory deficits.  ED Treatments / Results  Labs (all labs ordered are listed, but only abnormal results are displayed) Labs Reviewed - No data to display  EKG  EKG Interpretation None       Radiology No results found.  Procedures Procedures (including critical care time)  Medications Ordered in ED Medications  ketorolac (TORADOL) 15 MG/ML injection 15 mg (not administered)  methocarbamol (ROBAXIN) 500 mg in dextrose 5 % 50 mL IVPB (not administered)     Initial Impression /  Assessment and Plan / ED Course  I have reviewed the triage vital signs and the nursing notes.  Pertinent labs & imaging results that were available during my care of the patient were reviewed by me and considered in my medical decision making (see chart for details).  Exacerbation of chronic back pain. Old records are reviewed, and he had been seen in the ED 2 months ago following a fall and had been prescribed hydrocodone-acetaminophen. At that time, CT scan of chest, abdomen, pelvis showed no evidence of aneurysm. No indication for imaging today. He will be given a therapeutic trial of methocarbamol and ketorolac.  He feels much better after above noted treatment. He is discharged with prescriptions for naproxen, methocarbamol, and hydrocodone-acetaminophen. Follow-up with PCP.  Final Clinical Impressions(s) / ED Diagnoses   Final diagnoses:  Chronic midline back pain, unspecified back location    New Prescriptions New Prescriptions   METHOCARBAMOL (ROBAXIN) 500 MG TABLET    Take 1 tablet (500 mg total) by mouth every 6 (six) hours as needed for muscle spasms.   NAPROXEN (NAPROSYN) 375 MG TABLET    Take 1 tablet (375 mg total) by mouth 2 (two) times daily.     Delora Fuel, MD 96/78/93 [phone]

## 2017-05-04 NOTE — ED Notes (Signed)
Bed: SH68 Expected date:  Expected time:  Means of arrival:  Comments: EMS 83 yo male chronic back pain/son said he came home and found patient on all 4's on the floor-148/80-no cardiac hx

## 2017-05-04 NOTE — ED Triage Notes (Signed)
Pt resting at present time, no s/s of distress.

## 2017-05-04 NOTE — ED Triage Notes (Signed)
Patient arrives by EMS with complaints of chronic lower back pain. Son stated to EMS that he left the patient alone for a "short while" and when he came home he found his Father on all 4's on the floor because of his back pain. Took Tramadol at 0100. Patient is comfortable on EMS stretcher-pain with movement. MP SR with PAC's. BP 148/86 HR 86

## 2017-05-12 ENCOUNTER — Telehealth: Payer: Self-pay | Admitting: Internal Medicine

## 2017-05-12 DIAGNOSIS — M545 Low back pain: Secondary | ICD-10-CM

## 2017-05-12 NOTE — Telephone Encounter (Signed)
Daughter states Pt went to the hospital and was put on Robaxin and Hydrocdon and  Naproxen for a pulled muscle. These medicines combined knocked the Pt out. His other daughter put a  Flector patch-12 hr patch on his back and they would like to know if know if Dr. Jenny Reichmann agrees with this. They said it seemed to help him.   She would also like a Referral to Physical therapy for the Pt. Please call back.

## 2017-05-13 MED ORDER — DICLOFENAC EPOLAMINE 1.3 % TD PTCH: 1.0000 | MEDICATED_PATCH | Freq: Two times a day (BID) | TRANSDERMAL | 1 refills | Status: DC

## 2017-05-13 NOTE — Telephone Encounter (Signed)
Faxed

## 2017-05-13 NOTE — Telephone Encounter (Signed)
Dr John please advise.  

## 2017-05-13 NOTE — Telephone Encounter (Signed)
[place] for [employer] rx though I am not sure if covered under his insurance - Done hardcopy to [employer] for outpatient PT  - done erx

## 2017-05-15 ENCOUNTER — Telehealth: Payer: Self-pay | Admitting: Internal Medicine

## 2017-05-15 ENCOUNTER — Encounter: Payer: Self-pay | Admitting: Internal Medicine

## 2017-05-15 ENCOUNTER — Other Ambulatory Visit (INDEPENDENT_AMBULATORY_CARE_PROVIDER_SITE_OTHER): Payer: Medicare Other

## 2017-05-15 ENCOUNTER — Ambulatory Visit (INDEPENDENT_AMBULATORY_CARE_PROVIDER_SITE_OTHER): Payer: Medicare Other | Admitting: Internal Medicine

## 2017-05-15 ENCOUNTER — Ambulatory Visit (INDEPENDENT_AMBULATORY_CARE_PROVIDER_SITE_OTHER)
Admission: RE | Admit: 2017-05-15 | Discharge: 2017-05-15 | Disposition: A | Discharge status: Home / Self Care | Payer: Medicare Other | Source: Ambulatory Visit | Attending: Internal Medicine | Admitting: Internal Medicine

## 2017-05-15 VITALS — BP 112/66 | HR 100 | Ht 68.0 in | Wt 124.0 lb

## 2017-05-15 DIAGNOSIS — F0390 Unspecified dementia without behavioral disturbance: Secondary | ICD-10-CM | POA: Diagnosis not present

## 2017-05-15 DIAGNOSIS — R269 Unspecified abnormalities of gait and mobility: Secondary | ICD-10-CM

## 2017-05-15 DIAGNOSIS — Z0001 Encounter for general adult medical examination with abnormal findings: Secondary | ICD-10-CM | POA: Diagnosis not present

## 2017-05-15 DIAGNOSIS — M545 Low back pain, unspecified: Secondary | ICD-10-CM | POA: Insufficient documentation

## 2017-05-15 DIAGNOSIS — Z0279 Encounter for issue of other medical certificate: Secondary | ICD-10-CM

## 2017-05-15 DIAGNOSIS — R739 Hyperglycemia, unspecified: Secondary | ICD-10-CM

## 2017-05-15 DIAGNOSIS — I10 Essential (primary) hypertension: Secondary | ICD-10-CM | POA: Diagnosis not present

## 2017-05-15 DIAGNOSIS — F03A0 Unspecified dementia, mild, without behavioral disturbance, psychotic disturbance, mood disturbance, and anxiety: Secondary | ICD-10-CM

## 2017-05-15 LAB — HEPATIC FUNCTION PANEL
ALBUMIN: 4.1 g/dL (ref 3.5–5.2)
ALT: 26 U/L (ref 0–53)
AST: 38 U/L — AB (ref 0–37)
Alkaline Phosphatase: 121 U/L — ABNORMAL HIGH (ref 39–117)
Bilirubin, Direct: 0.1 mg/dL (ref 0.0–0.3)
Total Bilirubin: 0.4 mg/dL (ref 0.2–1.2)
Total Protein: 7.3 g/dL (ref 6.0–8.3)

## 2017-05-15 LAB — URINALYSIS, ROUTINE W REFLEX MICROSCOPIC
LEUKOCYTES UA: NEGATIVE
Nitrite: NEGATIVE
SPECIFIC GRAVITY, URINE: 1.025 (ref 1.000–1.030)
Total Protein, Urine: NEGATIVE
Urine Glucose: NEGATIVE
Urobilinogen, UA: 0.2 (ref 0.0–1.0)
pH: 6 (ref 5.0–8.0)

## 2017-05-15 LAB — CBC WITH DIFFERENTIAL/PLATELET
Basophils Absolute: 0 10*3/uL (ref 0.0–0.1)
Basophils Relative: 0.6 % (ref 0.0–3.0)
EOS PCT: 0.7 % (ref 0.0–5.0)
Eosinophils Absolute: 0 10*3/uL (ref 0.0–0.7)
HCT: 46.2 % (ref 39.0–52.0)
Hemoglobin: 15.1 g/dL (ref 13.0–17.0)
LYMPHS ABS: 1.2 10*3/uL (ref 0.7–4.0)
Lymphocytes Relative: 24.4 % (ref 12.0–46.0)
MCHC: 32.6 g/dL (ref 30.0–36.0)
MCV: 100.4 fl — AB (ref 78.0–100.0)
MONOS PCT: 9.7 % (ref 3.0–12.0)
Monocytes Absolute: 0.5 10*3/uL (ref 0.1–1.0)
NEUTROS ABS: 3.2 10*3/uL (ref 1.4–7.7)
NEUTROS PCT: 64.6 % (ref 43.0–77.0)
PLATELETS: 212 10*3/uL (ref 150.0–400.0)
RBC: 4.6 Mil/uL (ref 4.22–5.81)
RDW: 14.7 % (ref 11.5–15.5)
WBC: 5 10*3/uL (ref 4.0–10.5)

## 2017-05-15 LAB — BASIC METABOLIC PANEL
BUN: 26 mg/dL — AB (ref 6–23)
CALCIUM: 9.8 mg/dL (ref 8.4–10.5)
CO2: 32 mEq/L (ref 19–32)
CREATININE: 1.14 mg/dL (ref 0.40–1.50)
Chloride: 102 mEq/L (ref 96–112)
GFR: 63.43 mL/min (ref >60.00)
GLUCOSE: 126 mg/dL — AB (ref 70–99)
Potassium: 3.9 mEq/L (ref 3.5–5.1)
Sodium: 140 mEq/L (ref 135–145)

## 2017-05-15 LAB — LIPID PANEL
CHOLESTEROL: 207 mg/dL — AB (ref 0–200)
HDL: 79.6 mg/dL (ref >39.00)
LDL CALC: 106 mg/dL — AB (ref 0–99)
NonHDL: 127.34
TRIGLYCERIDES: 108 mg/dL (ref 0.0–149.0)
Total CHOL/HDL Ratio: 3
VLDL: 21.6 mg/dL (ref 0.0–40.0)

## 2017-05-15 LAB — HEMOGLOBIN A1C: HEMOGLOBIN A1C: 5.9 % (ref 4.6–6.5)

## 2017-05-15 LAB — TSH: TSH: 3.21 u[IU]/mL (ref 0.35–4.50)

## 2017-05-15 NOTE — Telephone Encounter (Signed)
Form completed & given to daughter &copy sent to scan.   Fax: [phone] Suanne Marker

## 2017-05-15 NOTE — Assessment & Plan Note (Addendum)
Cant r/o recent fall with back pain as was not witnessed; ok for LS spine film today  In addition to the time spent performing CPE, I spent an additional 25 minutes face to face,in which greater than 50% of this time was spent in counseling and coordination of care for patient's acute illness as documented, including the differential dx, further eval and tx and other management of low back pain, gait disorder, HTN and dementia

## 2017-05-15 NOTE — Telephone Encounter (Signed)
Rhonda from [place] called regarding the [facility] form that was filled out today. She said that it was mentioned that he should have PT. She said that on the [facility] it does not specify the frequency that is recommend. She also said that the medications were listed but there were no directions. She can be reached at [phone].

## 2017-05-15 NOTE — Progress Notes (Signed)
Subjective:    Patient ID: Eric Cooley, male    DOB: 1920-06-21, 83 y.o.   MRN: [id number]  HPI   Here for wellness and f/u;  Overall doing ok;  Pt denies Chest pain, worsening SOB, DOE, wheezing, orthopnea, PND, worsening LE edema, palpitations, dizziness or syncope.  Pt denies neurological change such as new headache, facial or extremity weakness.  Pt denies polydipsia, polyuria, or low sugar symptoms. Pt states overall good compliance with treatment and medications, good tolerability, and has been trying to follow appropriate diet.  Pt denies worsening depressive symptoms, suicidal ideation or panic. No fever, night sweats, wt loss, loss of appetite, or other constitutional symptoms.  Pt states good ability with ADL's except needs assist with bathing and some with dressing as well, has mod to high current fall risk, home safety reviewed and adequate, no other significant changes in hearing, and not active with exercise.  Also  Here with 2.5 wks MSk problems, started with lower back pain with working in the yard, then later rode his bike later in the day, but then next day has been essentially bedridden since then; seen by EMS at the ED July 29, tx with hydrocodone, and muscle relaxer but just "knocked him out."  Would like SNF rehab, and julie (daughter) and pt both request FL2 filled out for Rehab admit to local facility [facility]).  Today is first day OOB nad the house, walking with walker, very tired at 50 ft.  Has been taking po fairly well, has meals on wheels.  Wt is relatively stable Wt Readings from Last 3 Encounters:  05/15/17 124 lb (56.2 kg)  02/14/17 136 lb (61.7 kg)  02/11/17 136 lb (61.7 kg)  Denies worsening depressive symptoms, suicidal ideation, or panic; has ongoing anxiety, not increased recently.  Dementia overall stable symptomatically with gradual worsening at best, and not assoc with behavioral changes such as hallucinations, paranoia, or agitation.  Just frustrated with  eyes and has appt with [facility], may need right eye vitrectomy.   Past Medical History:  Diagnosis Date  . Allergic rhinitis, cause unspecified 03/29/2011  . Anemia 05/2009   blood loss anemia, transfused 6 units.   . Arthritis   . BPH (benign prostatic hypertrophy) 03/29/2011  . CAD (coronary artery disease) 05/2012  . Colon polyp 05/2012   cecal, ascending tubular adenomas.   . DJD (degenerative joint disease), lumbar 04/01/2011  . Gastrointestinal stromal tumor (GIST) of stomach ([place]) 05/2009, 10/2009   2010: gastric ulcer with GIST: IR embolization left gastric artery.  2011: partial gastrectomy  . GERD (gastroesophageal reflux disease) 03/29/2011  . History of nuclear stress test 03/27/2010   exercise; no ischemic changes, normal study   . HTN (hypertension) 03/29/2011  . Hyperlipidemia 03/29/2011  . Hypertension   . Low back pain   . MI (myocardial infarction) ([place])   . Prostate cancer ([place]) 03/29/2011  . S/P cholecystectomy 03/29/2011  . 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 Urine incontinence    Past Surgical History:  Procedure Laterality Date  . CHOLECYSTECTOMY  1986  . CORONARY ANGIOPLASTY WITH STENT PLACEMENT  04/24/2009   2 Xience DES to LAD 3.5x28mm and 3.0x69mm overlapping  . gastric artery emoblization Left 05/2009  . PARTIAL GASTRECTOMY  10/2009   GIST  . TONSILLECTOMY  1951  . TRANSTHORACIC ECHOCARDIOGRAM  10/09/2009   EF 55-60%; normaml LV systolic function with grade 1 diastolic dysfunction    reports that he quit smoking about 56  years ago. He has never used smokeless tobacco. He reports that he drinks about 0.6 oz of alcohol per week . He reports that he does not use drugs. family history includes Arthritis in his father and mother; Heart failure in his mother; Lung cancer in his father; Ovarian cancer in his mother. No Known Allergies Current Outpatient Prescriptions on File Prior to Visit  Medication Sig Dispense Refill  .  acetaminophen (TYLENOL) 500 MG tablet Take 500 mg by mouth every 6 (six) hours as needed (pain).    Marland Kitchen aspirin 81 MG EC tablet Take 1 tablet (81 mg total) by mouth daily. 30 tablet 12  . diclofenac (FLECTOR) 1.3 % PTCH Place 1 patch onto the skin 2 (two) times daily. 60 patch 1  . docusate sodium (COLACE) 100 MG capsule Take 1 capsule (100 mg total) by mouth every 12 (twelve) hours. 60 capsule 0  . fexofenadine (ALLEGRA) 180 MG tablet Take 1 tablet (180 mg total) by mouth daily. 30 tablet 2  . HYDROcodone-acetaminophen (NORCO/VICODIN) 5-325 MG tablet Take 1 tablet by mouth every 4 (four) hours as needed for moderate pain or severe pain. 15 tablet 0  . Memantine HCl-Donepezil HCl (NAMZARIC) 28-10 MG CP24 Take 1 tablet by mouth daily. 90 capsule 3  . methocarbamol (ROBAXIN) 500 MG tablet Take 1 tablet (500 mg total) by mouth every 6 (six) hours as needed for muscle spasms. 40 tablet 0  . naproxen (NAPROSYN) 375 MG tablet Take 1 tablet (375 mg total) by mouth 2 (two) times daily. 30 tablet 0  . traMADol (ULTRAM) 50 MG tablet take 1 tablet by mouth three times a day 90 tablet 5  . [DISCONTINUED] fluticasone (FLONASE) 50 MCG/ACT nasal spray Place 2 sprays into the nose daily.       No current facility-administered medications on file prior to visit.    Review of Systems Unable due to pain and dementia All other system neg per family    Objective:   Physical Exam BP 112/66   Pulse 100   Ht 5\' 8"  (1.727 m)   Wt 124 lb (56.2 kg)   SpO2 98%   BMI 18.85 kg/m  VS noted,  Constitutional: Pt appears in NAD HENT: Head: NCAT.  Right Ear: External ear normal.  Left Ear: External ear normal.  Eyes: . Pupils are equal, round, and reactive to light. Conjunctivae and EOM are normal Nose: without d/c or deformity Neck: Neck supple. Gross normal ROM Cardiovascular: Normal rate and regular rhythm.   Pulmonary/Chest: Effort normal and breath sounds without rales or wheezing.  Abd:  Soft, NT, ND, + BS, no  organomegaly Spine:  Lumbar approx L3 midline tender, states has some radiation to the right back and side without rash Neurological: Pt is alert. At baseline orientation, motor grossly intact Skin: Skin is warm. No rashes, other new lesions, no LE edema Psychiatric: Pt behavior is normal without agitation  No other exam findings Lab Results  Component Value Date   WBC 6.8 02/14/2017   HGB 13.9 02/14/2017   HCT 41.9 02/14/2017   PLT 169 02/14/2017   GLUCOSE 116 (H) 02/14/2017   CHOL 230 (H) 09/28/2015   TRIG 101.0 09/28/2015   HDL 98.20 09/28/2015   LDLDIRECT 84.2 01/14/2013   LDLCALC 112 (H) 09/28/2015   ALT 17 02/14/2017   AST 41 02/14/2017   NA 139 02/14/2017   K 3.5 02/14/2017   CL 103 02/14/2017   CREATININE 1.18 02/14/2017   BUN 25 (H) 02/14/2017  CO2 28 02/14/2017   TSH 2.39 09/28/2015   PSA 3.65 07/16/2013   INR 0.96 03/14/2015   HGBA1C  04/21/2009    6.0 (NOTE) The ADA recommends the following therapeutic goal for glycemic control related to Hgb A1c measurement: Goal of therapy: <6.5 Hgb A1c  Reference: American Diabetes Association: Clinical Practice Recommendations 2010, Diabetes Care, 2010, 33: (Suppl  1).       Assessment & Plan:

## 2017-05-15 NOTE — Telephone Encounter (Signed)
Eric Cooley from [facility] called about the [facility] forms, they are needing more notes and diagnosis to admit the pt, they do not believe what they were given is enough for [employer] to cover the cost. Please call back and ask for Eric Cooley  [phone]

## 2017-05-15 NOTE — Patient Instructions (Signed)
Please continue all other medications as before, and refills have been done if requested.  Please have the pharmacy call with any other refills you may need.  Please continue your efforts at being more active, low cholesterol diet, and weight control.  You are otherwise up to date with prevention measures today.  Please keep your appointments with your specialists as you may have planned  There FL2 form will be filled out and faxed to your number supplied today  Please go to the XRAY Department in the Basement (go straight as you get off the elevator) for the x-ray testing  Please go to the LAB in the Basement (turn left off the elevator) for the tests to be done today  You will be contacted by phone if any changes need to be made immediately.  Otherwise, you will receive a letter about your results with an explanation, but please check with MyChart first.  Please remember to sign up for MyChart if you have not done so, as this will be important to you in the future with finding out test results, communicating by private email, and scheduling acute appointments online when needed.  Please return in 6 months, or sooner if needed

## 2017-05-15 NOTE — Telephone Encounter (Signed)
Ok, this can be done

## 2017-05-15 NOTE — Assessment & Plan Note (Signed)
To cont walker, FL2 to be done today for fax to [facility] for rehab admit

## 2017-05-15 NOTE — Assessment & Plan Note (Signed)

## 2017-05-15 NOTE — Assessment & Plan Note (Signed)
stable overall by history and exam, recent data reviewed with pt, and pt to continue medical treatment as before,  to f/u any worsening symptoms or concerns BP Readings from Last 3 Encounters:  05/15/17 112/66  05/04/17 119/72  02/14/17 (!) 130/55

## 2017-05-15 NOTE — Telephone Encounter (Signed)
Eric Cooley said that is nothing on the [facility] form suggesting that he needs skilled nursing. It has to say something about a falls, gait or anything with medical justification on why he needs to have rehab and a frequency would be needed.   Please advise. Copy of the FL2 is in your stack with a med list paper clipped to it with med directions that I will fax.

## 2017-05-15 NOTE — Telephone Encounter (Signed)
The medication list will need to be corrected  PT frequency is normally determined at the first exam, so I would not be able to say this

## 2017-05-15 NOTE — Assessment & Plan Note (Signed)
Stable, cont same tx,  to f/u any worsening symptoms or concerns

## 2017-05-16 NOTE — Telephone Encounter (Signed)
Called Debbie Page back and LVM.

## 2017-05-19 ENCOUNTER — Non-Acute Institutional Stay (SKILLED_NURSING_FACILITY): Payer: Medicare Other | Admitting: Adult Health

## 2017-05-19 ENCOUNTER — Encounter: Payer: Self-pay | Admitting: Adult Health

## 2017-05-19 DIAGNOSIS — I2510 Atherosclerotic heart disease of native coronary artery without angina pectoris: Secondary | ICD-10-CM

## 2017-05-19 DIAGNOSIS — I10 Essential (primary) hypertension: Secondary | ICD-10-CM | POA: Diagnosis not present

## 2017-05-19 DIAGNOSIS — M545 Low back pain: Secondary | ICD-10-CM

## 2017-05-19 DIAGNOSIS — R2681 Unsteadiness on feet: Secondary | ICD-10-CM

## 2017-05-19 DIAGNOSIS — G8929 Other chronic pain: Secondary | ICD-10-CM

## 2017-05-19 DIAGNOSIS — J309 Allergic rhinitis, unspecified: Secondary | ICD-10-CM | POA: Diagnosis not present

## 2017-05-19 DIAGNOSIS — F0390 Unspecified dementia without behavioral disturbance: Secondary | ICD-10-CM

## 2017-05-19 DIAGNOSIS — F03A0 Unspecified dementia, mild, without behavioral disturbance, psychotic disturbance, mood disturbance, and anxiety: Secondary | ICD-10-CM

## 2017-05-19 NOTE — Progress Notes (Addendum)
DATE:  05/19/2017   MRN:  [id number]  BIRTHDAY: 05/24/1920  Facility:  Nursing Home Location:  [facility] Room Number: 111-A  LEVEL OF CARE:  SNF (31)  Contact Information    Name Relation Home Work [place] Daughter [phone]  [phone]       Code Status History    Date Active Date Inactive Code Status Order ID Comments User Context   03/11/2015 12:55 AM 03/14/2015  2:54 PM Full Code [id number]  Toy Baker, MD Inpatient   07/12/2013 12:06 AM 07/13/2013  5:07 PM Full Code [id number]  Etta Quill, DO ED      Chief Complaint  Patient presents with  . Acute Visit    Medication management    HISTORY OF PRESENT ILLNESS:  This is a 72-YO male seen for an acute visit.  He was admitted from home at the request of his PCP for short-term rehabilitation due to lower back pain and debility. Lower back pain started after working in the yard then later he rode his bike. He has been essentially bedridden since then and was seen in the ED on July 29 wherein he was given Hydrocodone and muscle relaxer which caused him to be "knocked out."  Patient and family requested for a short-term rehabilitation PCP referred him to [facility]. He was seen in the room and was seen walking with the walker with assistance from PT. He was interested in how long would it take him to get better. He has a PMH of anemia, BPH, CAD, colon polyp, DJD, GIST of stomach and arthritis.      PAST MEDICAL HISTORY:  Past Medical History:  Diagnosis Date  . Allergic rhinitis, cause unspecified 03/29/2011  . Anemia 05/2009   blood loss anemia, transfused 6 units.   . Arthritis   . BPH (benign prostatic hypertrophy) 03/29/2011  . CAD (coronary artery disease) 05/2012  . Colon polyp 05/2012   cecal, ascending tubular adenomas.   . DJD (degenerative joint disease), lumbar 04/01/2011  . Gastrointestinal stromal tumor (GIST) of stomach ([place]) 05/2009,  10/2009   2010: gastric ulcer with GIST: IR embolization left gastric artery.  2011: partial gastrectomy  . GERD (gastroesophageal reflux disease) 03/29/2011  . History of nuclear stress test 03/27/2010   exercise; no ischemic changes, normal study   . HTN (hypertension) 03/29/2011  . Hyperlipidemia 03/29/2011  . Hypertension   . Low back pain   . MI (myocardial infarction) ([place])   . Prostate cancer ([place]) 03/29/2011  . S/P cholecystectomy 03/29/2011  . 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 Urine incontinence      CURRENT MEDICATIONS: Reviewed  Patient's Medications  New Prescriptions   No medications on file  Previous Medications   ACETAMINOPHEN (TYLENOL) 500 MG TABLET    Take 500 mg by mouth every 6 (six) hours as needed (pain).   ASPIRIN 81 MG EC TABLET    Take 1 tablet (81 mg total) by mouth daily.   DICLOFENAC (FLECTOR) 1.3 % PTCH    Place 1 patch onto the skin 2 (two) times daily as needed.   DOCUSATE SODIUM (COLACE) 100 MG CAPSULE    Take 1 capsule (100 mg total) by mouth every 12 (twelve) hours.   FEXOFENADINE (ALLEGRA) 180 MG TABLET    Take 1 tablet (180 mg total) by mouth daily.   HYDROCODONE-ACETAMINOPHEN (NORCO/VICODIN) 5-325 MG TABLET    Take 1 tablet by mouth  every 6 (six) hours as needed for moderate pain.   MEMANTINE HCL-DONEPEZIL HCL (NAMZARIC) 28-10 MG CP24    Take 1 tablet by mouth daily.   METHOCARBAMOL (ROBAXIN) 500 MG TABLET    Take 1 tablet (500 mg total) by mouth every 6 (six) hours as needed for muscle spasms.   NAPROXEN (NAPROSYN) 375 MG TABLET    Take 375 mg by mouth 2 (two) times daily as needed.   NUTRITIONAL SUPPLEMENT LIQD    Take 120 mLs by mouth daily. MedPass   TRAMADOL (ULTRAM) 50 MG TABLET    Take 50 mg by mouth 3 (three) times daily as needed.  Modified Medications   No medications on file  Discontinued Medications   DICLOFENAC (FLECTOR) 1.3 % PTCH    Place 1 patch onto the skin 2 (two) times daily.    HYDROCODONE-ACETAMINOPHEN (NORCO/VICODIN) 5-325 MG TABLET    Take 1 tablet by mouth every 4 (four) hours as needed for moderate pain or severe pain.   NAPROXEN (NAPROSYN) 375 MG TABLET    Take 1 tablet (375 mg total) by mouth 2 (two) times daily.   TRAMADOL (ULTRAM) 50 MG TABLET    take 1 tablet by mouth three times a day     No Known Allergies   REVIEW OF SYSTEMS:  GENERAL: no change in appetite, no fatigue, no weight changes, no fever, chills or weakness MOUTH and THROAT: Denies oral discomfort, gingival pain or bleeding, pain from teeth or hoarseness   RESPIRATORY: no cough, SOB, DOE, wheezing, hemoptysis CARDIAC: no chest pain, edema or palpitations GI: no abdominal pain, diarrhea, constipation, heart burn, nausea or vomiting GU: Denies dysuria, frequency, hematuria, incontinence, or discharge MUSCULOSKELETAL: + lower back pain PSYCHIATRIC: Denies feeling of depression or anxiety. No report of hallucinations, insomnia, paranoia, or agitation    PHYSICAL EXAMINATION  GENERAL APPEARANCE: In no acute distress.  SKIN:  Skin is warm and dry.  HEAD: Normal in size and contour. No evidence of trauma EYES: Lids open and close normally. No blepharitis, entropion or ectropion. EARS: Pinnae are normal. Patient hears normal voice tunes of the examiner MOUTH and THROAT: Lips are without lesions. Oral mucosa is moist and without lesions. Tongue is normal in shape, size, and color and without lesions RESPIRATORY: breathing is even & unlabored, BS CTAB CARDIAC: RRR, no murmur,no extra heart sounds, no edema GI: abdomen soft, normal BS, no masses, no tenderness, no hepatomegaly, no splenomegaly EXTREMITIES:  Able to move X 4 extremities, walks with walker PSYCHIATRIC: Alert and oriented X 3. Affect and behavior are appropriate   LABS/RADIOLOGY: Labs reviewed: Basic Metabolic Panel:  Recent Labs  02/14/17 1251 05/15/17 1119  NA 139 140  K 3.5 3.9  CL 103 102  CO2 28 32  GLUCOSE  116* 126*  BUN 25* 26*  CREATININE 1.18 1.14  CALCIUM 9.0 9.8   Liver Function Tests:  Recent Labs  02/14/17 1251 05/15/17 1119  AST 41 38*  ALT 17 26  ALKPHOS 87 121*  BILITOT 0.6 0.4  PROT 7.3 7.3  ALBUMIN 3.4* 4.1   CBC:  Recent Labs  02/14/17 1251 05/15/17 1119  WBC 6.8 5.0  NEUTROABS  --  3.2  HGB 13.9 15.1  HCT 41.9 46.2  MCV 99.5 100.4*  PLT 169 212.0   Lipid Panel:  Recent Labs  05/15/17 1119  HDL 79.60   Cardiac Enzymes:  Recent Labs  02/14/17 1251  TROPONINI <0.03     Dg Lumbar Spine Complete  Result  Date: 05/15/2017 CLINICAL DATA:  Chronic lumbar spine pain. EXAM: LUMBAR SPINE - COMPLETE 4+ VIEW COMPARISON:  02/14/2017 abdominal/ pelvic CT. 04/29/2012 lumbar spine MR FINDINGS: Five non rib-bearing lumbar type vertebra are again identified with mild apex right lumbar scoliosis. No acute fracture or subluxation identified. Mild compression of L1 is unchanged. Very mild multilevel disc space narrowing again noted. No focal bony lesions or spondylolysis. IMPRESSION: No evidence of acute abnormality. Very mild multilevel degenerative changes. Electronically Signed   By: Margarette Canada M.D.   On: 05/15/2017 14:55    ASSESSMENT/PLAN:   1. Unsteady gait - for rehabilitation with PT and OT, for therapeutic strengthening exercises, fall precautions   2. Low back pain, Chronic, with sciatica presence unspecified - for rehabilitation with PT and OT, for therapeutic strengthening exercises, continue tramadol 50 mg 1 tab by mouth twice a day when necessary, acetaminophen 500 mg 1 tab by mouth every 6 hours when necessary, Naproxen 375 mg 1 tab BID PRN and decrease Norco 5-325 mg 1 tab PO from Q 6 hours PRN to Q 12 hours PRN, fall precautions   3. Essential hypertension - well-controlled; not on any medications, check B/HR BID X 1 week   4. Allergic rhinitis, unspecified seasonality, unspecified trigger - continue Allegra 180 mg 1 tab by mouth daily   5.  Mild dementia - continue supportive care, fall precautions and Namzaric 28-10 mg 1 capsule PO Q D   6. CAD - no complaints of chest pain, continue aspirin 81 mg 1 tab daily      Goals of care:  Short-term rehabilitation    Maxine Fredman C. Hopewell - NP    [employer] [phone]

## 2017-05-29 ENCOUNTER — Encounter: Payer: Self-pay | Admitting: Internal Medicine

## 2017-05-29 ENCOUNTER — Non-Acute Institutional Stay (SKILLED_NURSING_FACILITY): Payer: Medicare Other | Admitting: Internal Medicine

## 2017-05-29 DIAGNOSIS — M545 Low back pain: Secondary | ICD-10-CM

## 2017-05-29 DIAGNOSIS — G8929 Other chronic pain: Secondary | ICD-10-CM | POA: Diagnosis not present

## 2017-05-29 DIAGNOSIS — K219 Gastro-esophageal reflux disease without esophagitis: Secondary | ICD-10-CM | POA: Diagnosis not present

## 2017-05-29 NOTE — Assessment & Plan Note (Addendum)
Admittedto SNF for PT/OT following acute exacerbation in the context of yard work and exercise Physical exam suggest thoracic spine scoliosis, reverse lumbar curvature with asymmetry of the paraspinous muscles Narcotic should be weaned as quickly as possible because of associated risks and literature documentation of no superior benefit versus acetaminophen and nonsteroidals. Unfortunately oral NSAIDS are not option due to history gastric ulcer. Topical diclofenac will be continued as ordered Freestyle swimming and stretch aerobics may be optimal exercise; trial of gabapentin at bedtime PT/OT will be consulted as to other options

## 2017-05-29 NOTE — Assessment & Plan Note (Addendum)
He is asymptomatic at this time but with history of GERD and gastric ulcer, nonsteroidals are contraindicated orally

## 2017-05-29 NOTE — Patient Instructions (Signed)
See assessment and plan under each diagnosis in the problem list and acutely for this visit 

## 2017-05-29 NOTE — Progress Notes (Signed)
NURSING HOME LOCATION:  [facility] ROOM NUMBER:  111-A  CODE STATUS:  Full Code  PCP:  Biagio Borg, MD  [place] [id number]    This is a comprehensive admission note to [facility] performed on this date less than 30 days from date of admission. Included are preadmission medical/surgical history;reconciled medication list; family history; social history and comprehensive review of systems.  Corrections and additions to the records were documented . Comprehensive physical exam was also performed. Additionally a clinical summary was entered for each active diagnosis pertinent to this admission in the Problem List to enhance continuity of care.  HPI: The patient is a 83 year old male admitted from home 05/16/17  at the request of his PCP for short-term rehabilitation due to low back pain with associated debility. The pain originated after yardwork and riding a bike .Tramadol was prescribed which worked temporarily, but he was essentially bedridden until seen in the ED on 7/29. He denied any associated neuropathic symptoms. He was given Toradol injection and parenteral Robaxin with significant improvement. He was discharged with prescription for hydrocodone and a muscle relaxant which "knocked him out". No imaging or labs were performed at that time. He was referred to the SNF for PT/OT. On 05/15/17  LS spine films were ordered by his PCP, there was no acute abnormality. There was mild compression of L1 which was stable.  Past medical and surgical history: He has a history of chronic low back pain over past 10-15 years, worse since a fall in 2012 with which he experienced LOC and apparently SAH.Marland Kitchen Past history is significant for myocardial infarction and prostate cancer. Medical diagnoses include hypertension, dyslipidemia, and GERD. He has had a cholecystectomy and embolization of left gastric artery for gastric ulcer and partial gastrectomy. Remotely he's had  coronary angioplasty with stent placement. Also he has had a colon polypectomy.  Social history:He drinks socially. He quit smoking in 1962.  Family history:Reviewed  Review of systems: He describes numbness from the left knee to the foot for over a year. He describes frequent urination but has no other GU symptoms. Occasional minor headaches by hx.  Constitutional: No fever,significant weight change, fatigue  Eyes: No redness, discharge, pain, vision change ENT/mouth: No nasal congestion,  purulent discharge, earache,change in hearing ,sore throat  Cardiovascular: No chest pain, palpitations,paroxysmal nocturnal dyspnea, claudication, edema  Respiratory: No cough, sputum production,hemoptysis, DOE , significant snoring,apnea  Gastrointestinal: No heartburn,dysphagia,abdominal pain, nausea / vomiting,rectal bleeding, melena,change in bowels Genitourinary: No dysuria,hematuria, pyuria,  incontinence, nocturia Musculoskeletal: No joint stiffness, joint swelling, weakness,pain Dermatologic: No rash, pruritus, change in appearance of skin Neurologic: No dizziness,syncope, seizures,  tingling Psychiatric: No significant anxiety , depression, insomnia, anorexia Endocrine: No change in hair/skin/ nails, excessive thirst, excessive hunger, excessive urination  Hematologic/lymphatic: No significant bruising, lymphadenopathy,abnormal bleeding Allergy/immunology: No itchy/ watery eyes, significant sneezing, urticaria, angioedema  Physical exam:  Pertinent or positive findings: The patient appears much younger than stated age. He is oriented X 3. He has a small cyst of the right upper lid. Ptosis greater on th left than the righ. He has occasional premature beats. There is suggestion of thoracic scoliosis and reverse lumbar curvature as well as asymmetry of the paraspinous musculature, much more prominent on the right than the left. Isolated DIP osteoarthritic changes are present. He has limb atrophy  but strength is good. There is interosseous wasting of the hands. Pedal pulses are decreased. Irregular hyperpigmentation changes are noted  over the left shin. There is a healed abrasion over the left leg above the ankle laterally.  General appearance:Thin but adequately nourished; no acute distress , increased work of breathing is present.   Lymphatic: No lymphadenopathy about the head, neck, axilla . Eyes: No conjunctival inflammation or lid edema is present. There is no scleral icterus. Ears:  External ear exam shows no significant lesions or deformities.   Nose:  External nasal examination shows no deformity or inflammation. Nasal mucosa are pink and moist without lesions ,exudates Oral exam: lips and gums are healthy appearing.There is no oropharyngeal erythema or exudate . Neck:  No thyromegaly, masses, tenderness noted.    Heart:  No murmur, click, rub .  Lungs:Chest clear to auscultation without wheezes, rhonchi,rales , rubs. Abdomen:Bowel sounds are normal. Abdomen is soft and nontender with no organomegaly, hernias,masses. GU: deferred  Extremities:  No cyanosis, clubbing,edema  Neurologic exam : Strength equal  in upper & lower extremities Balance,Rhomberg,finger to nose testing could not be completed due to clinical state Deep tendon reflexes are equal Skin: Warm & dry w/o tenting. No significant lesions or rash.  See clinical summary under each active problem in the Problem List with associated updated therapeutic plan

## 2017-06-19 ENCOUNTER — Ambulatory Visit: Payer: Medicare Other | Admitting: Internal Medicine

## 2017-06-25 ENCOUNTER — Ambulatory Visit (INDEPENDENT_AMBULATORY_CARE_PROVIDER_SITE_OTHER): Payer: Medicare Other | Admitting: Internal Medicine

## 2017-06-25 ENCOUNTER — Encounter: Payer: Self-pay | Admitting: Internal Medicine

## 2017-06-25 VITALS — BP 140/86 | HR 87 | Temp 97.6°F | Ht 68.0 in | Wt 130.0 lb

## 2017-06-25 DIAGNOSIS — H1013 Acute atopic conjunctivitis, bilateral: Secondary | ICD-10-CM | POA: Diagnosis not present

## 2017-06-25 DIAGNOSIS — I10 Essential (primary) hypertension: Secondary | ICD-10-CM | POA: Diagnosis not present

## 2017-06-25 DIAGNOSIS — H1010 Acute atopic conjunctivitis, unspecified eye: Secondary | ICD-10-CM | POA: Insufficient documentation

## 2017-06-25 DIAGNOSIS — Z23 Encounter for immunization: Secondary | ICD-10-CM | POA: Diagnosis not present

## 2017-06-25 DIAGNOSIS — J309 Allergic rhinitis, unspecified: Secondary | ICD-10-CM | POA: Diagnosis not present

## 2017-06-25 NOTE — Progress Notes (Signed)
Subjective:    Patient ID: Eric Cooley, male    DOB: [birth year], 83 y.o.   MRN: [id number]  HPI  Here to f/u after just d/c from [facility] rehab with 4 wks PT/OT;  Now home about 2 wks.  Has been living with daughter in 2 story home wiht stairs and was up and down several times per day for last 4 days, and no falls  [facility].  Has cane and walker but not using at all right now.  No pain compliants today, takes tramadol prn. . Pt denies chest pain, increased sob or doe, wheezing, orthopnea, PND, increased LE swelling, palpitations, dizziness or syncope.  Pt denies new neurological symptoms such as new headache, or facial or extremity weakness or numbness.  Pt denies fever, wt loss, night sweats, loss of appetite, or other constitutional symptoms  Does seem some assistance with preparing food but o/w has been living by himself.  Remains full code after d/w daughter today, not clear to me about her realism. Only complaint today is allergic eye matting and d/c with itching for several wks. Past Medical History:  Diagnosis Date  . Allergic rhinitis, cause unspecified 03/29/2011  . Anemia 05/2009   blood loss anemia, transfused 6 units.   . Arthritis   . BPH (benign prostatic hypertrophy) 03/29/2011  . CAD (coronary artery disease) 05/2012  . Colon polyp 05/2012   cecal, ascending tubular adenomas.   . DJD (degenerative joint disease), lumbar 04/01/2011  . Gastrointestinal stromal tumor (GIST) of stomach ([place]) 05/2009, 10/2009   2010: gastric ulcer with GIST: IR embolization left gastric artery.  2011: partial gastrectomy  . GERD (gastroesophageal reflux disease) 03/29/2011  . History of nuclear stress test 03/27/2010   exercise; no ischemic changes, normal study   . HTN (hypertension) 03/29/2011  . Hyperlipidemia 03/29/2011  . Low back pain   . MI (myocardial infarction) ([place])   . Prostate cancer ([place]) 03/29/2011  . S/P cholecystectomy 03/29/2011  .  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 Urine incontinence    Past Surgical History:  Procedure Laterality Date  . CHOLECYSTECTOMY  1986  . CORONARY ANGIOPLASTY WITH STENT PLACEMENT  04/24/2009   2 Xience DES to LAD 3.5x65mm and 3.0x73mm overlapping  . gastric artery emoblization Left 05/2009  . PARTIAL GASTRECTOMY  10/2009   GIST  . TONSILLECTOMY  1951  . TRANSTHORACIC ECHOCARDIOGRAM  10/09/2009   EF 55-60%; normaml LV systolic function with grade 1 diastolic dysfunction    reports that he quit smoking about 56 years ago. He has never used smokeless tobacco. He reports that he drinks about 0.6 oz of alcohol per week . He reports that he does not use drugs. family history includes Arthritis in his father and mother; Heart failure in his mother; Lung cancer in his father; Ovarian cancer in his mother. No Known Allergies Current Outpatient Prescriptions on File Prior to Visit  Medication Sig Dispense Refill  . acetaminophen (TYLENOL) 500 MG tablet Take 500 mg by mouth every 6 (six) hours as needed (pain).    Marland Kitchen diclofenac (FLECTOR) 1.3 % PTCH Place 1 patch onto the skin 2 (two) times daily as needed.    . docusate sodium (COLACE) 100 MG capsule Take 1 capsule (100 mg total) by mouth every 12 (twelve) hours. 60 capsule 0  . fexofenadine (ALLEGRA) 180 MG tablet Take 1 tablet (180 mg total) by mouth daily. 30 tablet 2  .  gabapentin (NEURONTIN) 100 MG capsule One pill qhs as trial 30 capsule 2  . glucosamine-chondroitin 500-400 MG tablet Take 3 tablets by mouth daily.    Marland Kitchen HYDROcodone-acetaminophen (NORCO/VICODIN) 5-325 MG tablet Take 1 tablet by mouth every 12 (twelve) hours as needed for moderate pain.     . Memantine HCl-Donepezil HCl (NAMZARIC) 28-10 MG CP24 Take 1 tablet by mouth daily. 90 capsule 3  . Multiple Vitamin (MULTIVITAMIN) tablet Take 1 tablet by mouth daily.    Marland Kitchen NUTRITIONAL SUPPLEMENT LIQD Take 120 mLs by mouth daily. MedPass    . traMADol (ULTRAM)  50 MG tablet Take 50 mg by mouth 3 (three) times daily as needed.    . vitamin C (ASCORBIC ACID) 500 MG tablet Take 500 mg by mouth daily.    . [DISCONTINUED] fluticasone (FLONASE) 50 MCG/ACT nasal spray Place 2 sprays into the nose daily.       No current facility-administered medications on file prior to visit.    Review of Systems  Constitutional: Negative for other unusual diaphoresis or sweats HENT: Negative for ear discharge or swelling Eyes: Negative for other worsening visual disturbances Respiratory: Negative for stridor or other swelling  Gastrointestinal: Negative for worsening distension or other blood Genitourinary: Negative for retention or other urinary change Musculoskeletal: Negative for other MSK pain or swelling Skin: Negative for color change or other new lesions Neurological: Negative for worsening tremors and other numbness  Psychiatric/Behavioral: Negative for worsening agitation or other fatigue All other system neg per pt    Objective:   Physical Exam BP 140/86   Pulse 87   Temp 97.6 F (36.4 C) (Oral)   Ht 5\' 8"  (1.727 m)   Wt 130 lb (59 kg)   SpO2 98%   BMI 19.77 kg/m  VS noted,  Constitutional: Pt appears in NAD HENT: Head: NCAT.  Right Ear: External ear normal.  Left Ear: External ear normal.  Eyes: . Pupils are equal, round, and reactive to light. Conjunctivae with bilat weepy clearish d/c and EOM are normal Nose: without d/c or deformity Neck: Neck supple. Gross normal ROM Cardiovascular: Normal rate and regular rhythm.   Pulmonary/Chest: Effort normal and breath sounds without rales or wheezing.  Neurological: Pt is alert. At baseline orientation, motor grossly intact Skin: Skin is warm. No rashes, other new lesions, no LE edema Psychiatric: Pt behavior is normal without agitation  No other exam findings Lab Results  Component Value Date   WBC 5.0 05/15/2017   HGB 15.1 05/15/2017   HCT 46.2 05/15/2017   PLT 212.0 05/15/2017   GLUCOSE  126 (H) 05/15/2017   CHOL 207 (H) 05/15/2017   TRIG 108.0 05/15/2017   HDL 79.60 05/15/2017   LDLDIRECT 84.2 01/14/2013   LDLCALC 106 (H) 05/15/2017   ALT 26 05/15/2017   AST 38 (H) 05/15/2017   NA 140 05/15/2017   K 3.9 05/15/2017   CL 102 05/15/2017   CREATININE 1.14 05/15/2017   BUN 26 (H) 05/15/2017   CO2 32 05/15/2017   TSH 3.21 05/15/2017   PSA 3.65 07/16/2013   INR 0.96 03/14/2015   HGBA1C 5.9 05/15/2017       Assessment & Plan:

## 2017-06-25 NOTE — Patient Instructions (Addendum)
OK to try Murine or  Zaditor eye drops as needed  Please continue all other medications as before, and refills have been done if requested.  Please have the pharmacy call with any other refills you may need.  Please continue your efforts at being more active, low cholesterol diet  Please keep your appointments with your specialists as you may have planned  No further lab work is needed today  Please return in 6 months, or sooner if needed

## 2017-06-25 NOTE — Assessment & Plan Note (Signed)
Also has claritin otc prn,  to f/u any worsening symptoms or concerns

## 2017-06-25 NOTE — Assessment & Plan Note (Signed)
Mild to mod, for otc murine and/or zaditor prn,  to f/u any worsening symptoms or concerns

## 2017-06-25 NOTE — Assessment & Plan Note (Signed)
stable overall by history and exam, recent data reviewed with pt, and pt to continue medical treatment as before,  to f/u any worsening symptoms or concerns BP Readings from Last 3 Encounters:  06/25/17 140/86  05/29/17 134/68  05/19/17 137/74

## 2017-06-26 ENCOUNTER — Telehealth: Payer: Self-pay | Admitting: Internal Medicine

## 2017-06-26 NOTE — Telephone Encounter (Signed)
Ok for verbals 

## 2017-06-26 NOTE — Telephone Encounter (Signed)
Eric Cooley with [facility] [phone]  Need verbals PT  1 week 1 2 week 3

## 2017-06-27 NOTE — Telephone Encounter (Signed)
Called Frankie no answer LMOM w/MD response../lmb 

## 2017-07-08 ENCOUNTER — Telehealth: Payer: Self-pay | Admitting: Internal Medicine

## 2017-07-08 NOTE — Telephone Encounter (Signed)
Ok for verbals 

## 2017-07-08 NOTE — Telephone Encounter (Signed)
Requesting order for home health OT for one week one, two week one , and one week two.

## 2017-07-09 ENCOUNTER — Other Ambulatory Visit: Payer: Self-pay | Admitting: Internal Medicine

## 2017-07-09 MED ORDER — TRAMADOL HCL 50 MG PO TABS: 50.0000 mg | ORAL_TABLET | Freq: Three times a day (TID) | ORAL | 2 refills | Status: DC | PRN

## 2017-07-09 NOTE — Telephone Encounter (Signed)
Done hardcopy to Shirron  

## 2017-07-09 NOTE — Telephone Encounter (Signed)
Faxed

## 2017-07-18 ENCOUNTER — Encounter (HOSPITAL_COMMUNITY): Payer: Self-pay | Admitting: *Deleted

## 2017-07-18 ENCOUNTER — Emergency Department (HOSPITAL_COMMUNITY)
Admission: EM | Admit: 2017-07-18 | Discharge: 2017-07-19 | Disposition: A | Discharge status: Home / Self Care | Payer: Medicare Other | Attending: Emergency Medicine | Admitting: Emergency Medicine

## 2017-07-18 DIAGNOSIS — Y929 Unspecified place or not applicable: Secondary | ICD-10-CM | POA: Insufficient documentation

## 2017-07-18 DIAGNOSIS — Z8546 Personal history of malignant neoplasm of prostate: Secondary | ICD-10-CM | POA: Insufficient documentation

## 2017-07-18 DIAGNOSIS — Z955 Presence of coronary angioplasty implant and graft: Secondary | ICD-10-CM | POA: Diagnosis not present

## 2017-07-18 DIAGNOSIS — W0110XA Fall on same level from slipping, tripping and stumbling with subsequent striking against unspecified object, initial encounter: Secondary | ICD-10-CM | POA: Insufficient documentation

## 2017-07-18 DIAGNOSIS — Z79899 Other long term (current) drug therapy: Secondary | ICD-10-CM | POA: Insufficient documentation

## 2017-07-18 DIAGNOSIS — S0101XA Laceration without foreign body of scalp, initial encounter: Secondary | ICD-10-CM | POA: Diagnosis not present

## 2017-07-18 DIAGNOSIS — Z23 Encounter for immunization: Secondary | ICD-10-CM | POA: Diagnosis not present

## 2017-07-18 DIAGNOSIS — Y9389 Activity, other specified: Secondary | ICD-10-CM | POA: Insufficient documentation

## 2017-07-18 DIAGNOSIS — I10 Essential (primary) hypertension: Secondary | ICD-10-CM | POA: Insufficient documentation

## 2017-07-18 DIAGNOSIS — S0990XA Unspecified injury of head, initial encounter: Secondary | ICD-10-CM | POA: Diagnosis present

## 2017-07-18 DIAGNOSIS — Z87891 Personal history of nicotine dependence: Secondary | ICD-10-CM | POA: Diagnosis not present

## 2017-07-18 DIAGNOSIS — D131 Benign neoplasm of stomach: Secondary | ICD-10-CM | POA: Insufficient documentation

## 2017-07-18 DIAGNOSIS — I2510 Atherosclerotic heart disease of native coronary artery without angina pectoris: Secondary | ICD-10-CM | POA: Diagnosis not present

## 2017-07-18 DIAGNOSIS — Y999 Unspecified external cause status: Secondary | ICD-10-CM | POA: Diagnosis not present

## 2017-07-18 NOTE — ED Triage Notes (Signed)
Per EMS, pt from home, reports he was picking something up from his suit case, became dizzy when he stood up and fell, hitting the R back side of his head on the piano.  Pt denies LOC.  Pt is A&O

## 2017-07-18 NOTE — ED Notes (Signed)
Bed: WA01 Expected date:  Expected time:  Means of arrival:  Comments: 83 yo M Fall/Head lac

## 2017-07-18 NOTE — ED Provider Notes (Signed)
[facility] DEPT Provider Note   CSN: [id number] Arrival date & time: 07/18/17  2320     History   Chief Complaint No chief complaint on file.   HPI Eric Cooley is a 83 y.o. male.  83 year old male who presents with right occipital laceration to her scalp that he sustained after he fell today. He was bending over to take his upper suitcase and lost his balance and struck his head. No loss of consciousness. Denies any neck pain. Bleeding controlled with direct pressure. EMS called and patient transported here      Past Medical History:  Diagnosis Date  . Allergic rhinitis, cause unspecified 03/29/2011  . Anemia 05/2009   blood loss anemia, transfused 6 units.   . Arthritis   . BPH (benign prostatic hypertrophy) 03/29/2011  . CAD (coronary artery disease) 05/2012  . Colon polyp 05/2012   cecal, ascending tubular adenomas.   . DJD (degenerative joint disease), lumbar 04/01/2011  . Gastrointestinal stromal tumor (GIST) of stomach ([place]) 05/2009, 10/2009   2010: gastric ulcer with GIST: IR embolization left gastric artery.  2011: partial gastrectomy  . GERD (gastroesophageal reflux disease) 03/29/2011  . History of nuclear stress test 03/27/2010   exercise; no ischemic changes, normal study   . HTN (hypertension) 03/29/2011  . Hyperlipidemia 03/29/2011  . Low back pain   . MI (myocardial infarction) ([place])   . Prostate cancer ([place]) 03/29/2011  . S/P cholecystectomy 03/29/2011  . 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 Urine incontinence     Patient Active Problem List   Diagnosis Date Noted  . Allergic conjunctivitis 06/25/2017  . Low back pain 05/15/2017  . Bilateral leg pain 12/05/2016  . Right shoulder pain 12/05/2016  . CAP (community acquired pneumonia) 04/11/2016  . 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 Right flank pain, chronic 06/08/2015  . 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 Thoracic  scoliosis 03/30/2015  . Facet syndrome, lumbar ([place]) 03/30/2015  . Myofascial pain on right side 03/30/2015  . Mild dementia 03/14/2015  . Abnormal LFTs   . Chest pain 03/10/2015  . Left shoulder pain 02/01/2015  . Pleuritic chest pain 02/01/2015  . Abnormal CXR 12/16/2014  . Chronic back pain 12/16/2014  . Unsteady gait 12/02/2014  . Memory loss 07/19/2013  . Loss of weight 07/16/2013  . Chest pain with low risk for cardiac etiology 07/12/2013  . Protein-calorie malnutrition, severe ([place]) 07/12/2013  . Subacute confusional state 05/28/2013  . Gait disorder 05/28/2013  . Subarachnoid hemorrhage following injury ([place]) 04/16/2013  . Shoulder dislocation-right 04/16/2013  . Intermittent low back pain 04/15/2012  . Constipation 03/04/2012  . Dysuria 03/04/2012  . Pneumonia 01/29/2012  . Dizziness 12/18/2011  . Balance problems 12/18/2011  . Pre-ulcerative corn or callous 10/13/2011  . DJD (degenerative joint disease), lumbar 04/01/2011  . CAD (coronary artery disease) 03/29/2011  . Prostate cancer ([place]) 03/29/2011  . 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 HTN (hypertension) 03/29/2011  . Hyperlipidemia 03/29/2011  . BPH (benign prostatic hypertrophy) 03/29/2011  . GERD (gastroesophageal reflux disease) 03/29/2011  . Encounter for well adult exam with abnormal findings 03/29/2011  . S/P cholecystectomy 03/29/2011  . Allergic rhinitis 03/29/2011  . S/P partial gastrectomy 03/29/2011  . Anemia 03/29/2011    Past Surgical History:  Procedure Laterality Date  . CHOLECYSTECTOMY  1986  . CORONARY ANGIOPLASTY WITH STENT PLACEMENT  04/24/2009   2 Xience DES to LAD 3.5x38mm and 3.0x45mm overlapping  .  gastric artery emoblization Left 05/2009  . PARTIAL GASTRECTOMY  10/2009   GIST  . TONSILLECTOMY  1951  . TRANSTHORACIC ECHOCARDIOGRAM  10/09/2009   EF 55-60%; normaml LV systolic function with grade 1 diastolic dysfunction       Home Medications    Prior to Admission  medications   Medication Sig Start Date End Date Taking? Authorizing Provider  acetaminophen (TYLENOL) 500 MG tablet Take 500 mg by mouth every 6 (six) hours as needed (pain).    [provider]  diclofenac (FLECTOR) 1.3 % PTCH Place 1 patch onto the skin 2 (two) times daily as needed.    [provider]  docusate sodium (COLACE) 100 MG capsule Take 1 capsule (100 mg total) by mouth every 12 (twelve) hours. 02/14/17   Charlesetta Shanks, MD  fexofenadine (ALLEGRA) 180 MG tablet Take 1 tablet (180 mg total) by mouth daily. 12/05/16 12/05/17  Biagio Borg, MD  gabapentin (NEURONTIN) 100 MG capsule One pill qhs as trial 05/29/17   Hendricks Limes, MD  glucosamine-chondroitin 500-400 MG tablet Take 3 tablets by mouth daily.    [provider]  HYDROcodone-acetaminophen (NORCO/VICODIN) 5-325 MG tablet Take 1 tablet by mouth every 12 (twelve) hours as needed for moderate pain.     [provider]  Memantine HCl-Donepezil HCl (NAMZARIC) 28-10 MG CP24 Take 1 tablet by mouth daily. 05/14/16   Biagio Borg, MD  Multiple Vitamin (MULTIVITAMIN) tablet Take 1 tablet by mouth daily.    [provider]  NUTRITIONAL SUPPLEMENT LIQD Take 120 mLs by mouth daily. [facility]    [provider]  traMADol (ULTRAM) 50 MG tablet Take 1 tablet (50 mg total) by mouth 3 (three) times daily as needed. 07/09/17   Biagio Borg, MD  vitamin C (ASCORBIC ACID) 500 MG tablet Take 500 mg by mouth daily.    [provider]    Family History Family History  Problem Relation Age of Onset  . Arthritis Mother   . Ovarian cancer Mother   . Heart failure Mother   . Arthritis Father   . Lung cancer Father   . Colon cancer Neg Hx     Social History Social History  Substance Use Topics  . Smoking status: Former Smoker    Quit date: 10/07/1960  . Smokeless tobacco: Never Used     Comment: Stopped smoking in 1962  . Alcohol use 0.6 oz/week    1 Glasses of wine per week      Comment: very rare     Allergies   Patient has no known allergies.   Review of Systems Review of Systems  All other systems reviewed and are negative.    Physical Exam Updated Vital Signs There were no vitals taken for this visit.  Physical Exam  Constitutional: He is oriented to person, place, and time. He appears well-developed and well-nourished.  Non-toxic appearance. No distress.  HENT:  Head: Head is with laceration.    Eyes: Pupils are equal, round, and reactive to light. Conjunctivae, EOM and lids are normal.  Neck: Normal range of motion. Neck supple. No tracheal deviation present. No thyroid mass present.  Cardiovascular: Normal rate, regular rhythm and normal heart sounds.  Exam reveals no gallop.   No murmur heard. Pulmonary/Chest: Effort normal and breath sounds normal. No stridor. No respiratory distress. He has no decreased breath sounds. He has no wheezes. He has no rhonchi. He has no rales.  Abdominal: Soft. Normal appearance and bowel sounds  are normal. He exhibits no distension. There is no tenderness. There is no rebound and no CVA tenderness.  Musculoskeletal: Normal range of motion. He exhibits no edema or tenderness.  Neurological: He is alert and oriented to person, place, and time. He has normal strength. No cranial nerve deficit or sensory deficit. GCS eye subscore is 4. GCS verbal subscore is 5. GCS motor subscore is 6.  Skin: Skin is warm and dry. No abrasion and no rash noted.  Psychiatric: He has a normal mood and affect. His speech is normal and behavior is normal.  Nursing note and vitals reviewed.    ED Treatments / Results  Labs (all labs ordered are listed, but only abnormal results are displayed) Labs Reviewed - No data to display  EKG  EKG Interpretation None       Radiology No results found.  Procedures Procedures (including critical care time)  Medications Ordered in ED Medications - No data to display   Initial  Impression / Assessment and Plan / ED Course  I have reviewed the triage vital signs and the nursing notes.  Pertinent labs & imaging results that were available during my care of the patient were reviewed by me and considered in my medical decision making (see chart for details).     LACERATION REPAIR Performed by: Leota Jacobsen Authorized by: Leota Jacobsen Consent: Verbal consent obtained. Risks and benefits: risks, benefits and alternatives were discussed Consent given by: patient Patient identity confirmed: provided demographic data Prepped and Draped in normal sterile fashion Wound explored  Laceration Location: Scalp  Laceration Length: 1.5 cm  No Foreign Bodies seen or palpated  Anesthesia: local infiltration   Irrigation method: syringe Amount of cleaning: standard  Skin closure: Staples   Number of sutures: 3   Technique: Simple   Patient tolerance: Patient tolerated the procedure well with no immediate complications.  Final Clinical Impressions(s) / ED Diagnoses   Final diagnoses:  None   Patient without any focal neurological deficits. No pain along his cervical spine. Laceration repaired as above. Will update tetanus if needed. New Prescriptions New Prescriptions   No medications on file     Lacretia Leigh, MD 07/18/17 2340

## 2017-07-19 MED ORDER — TETANUS-DIPHTH-ACELL PERTUSSIS 5-2.5-18.5 LF-MCG/0.5 IM SUSP
0.5000 mL | Freq: Once | INTRAMUSCULAR | Status: AC
  Administered 2017-07-19: 0.5 mL via INTRAMUSCULAR
  Filled 2017-07-19: qty 0.5

## 2017-07-19 NOTE — Discharge Instructions (Signed)
Have his staples removed in 7 days

## 2017-07-28 ENCOUNTER — Telehealth: Payer: Self-pay | Admitting: Internal Medicine

## 2017-07-28 ENCOUNTER — Ambulatory Visit (INDEPENDENT_AMBULATORY_CARE_PROVIDER_SITE_OTHER): Payer: Medicare Other | Admitting: Family Medicine

## 2017-07-28 ENCOUNTER — Encounter: Payer: Self-pay | Admitting: Family Medicine

## 2017-07-28 ENCOUNTER — Ambulatory Visit (INDEPENDENT_AMBULATORY_CARE_PROVIDER_SITE_OTHER)
Admission: RE | Admit: 2017-07-28 | Discharge: 2017-07-28 | Disposition: A | Discharge status: Home / Self Care | Payer: Medicare Other | Source: Ambulatory Visit | Attending: Family Medicine | Admitting: Family Medicine

## 2017-07-28 VITALS — BP 142/88 | HR 63 | Temp 97.8°F | Ht 68.0 in | Wt 127.0 lb

## 2017-07-28 DIAGNOSIS — M545 Low back pain, unspecified: Secondary | ICD-10-CM

## 2017-07-28 DIAGNOSIS — S0101XA Laceration without foreign body of scalp, initial encounter: Secondary | ICD-10-CM | POA: Insufficient documentation

## 2017-07-28 DIAGNOSIS — S0101XD Laceration without foreign body of scalp, subsequent encounter: Secondary | ICD-10-CM

## 2017-07-28 NOTE — Progress Notes (Signed)
DAJION BICKFORD - 83 y.o. male MRN [id number]  Date of birth: Apr 03, 1920  SUBJECTIVE:  Including CC & ROS.  No chief complaint on file.   Mr. Ohlrich is a 83 year old male is presenting with scalp laceration and the need for staple removal and low back pain. He was seen in the emergency department on 10/12 after a fall. He denies any loss of consciousness or palpitations before his fall. He feels that he lost his balance and fell backwards and hit his head. Had a laceration that was repaired with 3 staples. This occurred about 10 days ago he feels that his back pain has acutely changed. He is having bilateral lower back pain with no radicular symptoms. He has chronic back pain for which he has been seen at pain management before. Denies any urinary incontinence or saddle anesthesia. Is able to ambulate. He does have a history of falls and has been doing rehabilitation. At one point he had a stay at [facility] to build up his strength after a fall.     Review of Systems  Constitutional: Negative for fever.  Musculoskeletal: Positive for back pain, gait problem and myalgias. Negative for joint swelling.  Skin: Negative for wound.  Neurological: Negative for numbness.    HISTORY: Past Medical, Surgical, Social, and Family History Reviewed & Updated per EMR.   Pertinent Historical Findings include:  Past Medical History:  Diagnosis Date  . Allergic rhinitis, cause unspecified 03/29/2011  . Anemia 05/2009   blood loss anemia, transfused 6 units.   . Arthritis   . BPH (benign prostatic hypertrophy) 03/29/2011  . CAD (coronary artery disease) 05/2012  . Colon polyp 05/2012   cecal, ascending tubular adenomas.   . DJD (degenerative joint disease), lumbar 04/01/2011  . Gastrointestinal stromal tumor (GIST) of stomach ([place]) 05/2009, 10/2009   2010: gastric ulcer with GIST: IR embolization left gastric artery.  2011: partial gastrectomy  . GERD (gastroesophageal reflux disease)  03/29/2011  . History of nuclear stress test 03/27/2010   exercise; no ischemic changes, normal study   . HTN (hypertension) 03/29/2011  . Hyperlipidemia 03/29/2011  . Low back pain   . MI (myocardial infarction) ([place])   . Prostate cancer ([place]) 03/29/2011  . S/P cholecystectomy 03/29/2011  . 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 Urine incontinence     Past Surgical History:  Procedure Laterality Date  . CHOLECYSTECTOMY  1986  . CORONARY ANGIOPLASTY WITH STENT PLACEMENT  04/24/2009   2 Xience DES to LAD 3.5x44mm and 3.0x57mm overlapping  . gastric artery emoblization Left 05/2009  . PARTIAL GASTRECTOMY  10/2009   GIST  . TONSILLECTOMY  1951  . TRANSTHORACIC ECHOCARDIOGRAM  10/09/2009   EF 55-60%; normaml LV systolic function with grade 1 diastolic dysfunction    No Known Allergies  Family History  Problem Relation Age of Onset  . Arthritis Mother   . Ovarian cancer Mother   . Heart failure Mother   . Arthritis Father   . Lung cancer Father   . Colon cancer Neg Hx      Social History   Social History  . Marital status: Widowed    Spouse name: N/A  . Number of children: 3  . Years of education: 19   Occupational History  . retired Art gallery manager    Social History Main Topics  . Smoking status: Former Smoker    Quit date: 10/07/1960  . Smokeless tobacco: Never Used     Comment: Stopped  smoking in 1962  . Alcohol use 0.6 oz/week    1 Glasses of wine per week     Comment: very rare  . Drug use: No  . Sexual activity: Not on file   Other Topics Concern  . Not on file   Social History Narrative  . No narrative on file     PHYSICAL EXAM:  VS: BP (!) 142/88   Pulse 63   Temp 97.8 F (36.6 C) (Oral)   Ht 5\' 8"  (1.727 m)   Wt 127 lb (57.6 kg)   SpO2 98%   BMI 19.31 kg/m  Physical Exam Gen: NAD, alert, cooperative with exam,  ENT: normal lips, normal nasal mucosa,  Eye: normal EOM, normal conjunctiva and lids CV:  no  edema, +2 pedal pulses   Resp: no accessory muscle use, non-labored,  Skin: no rashes, no areas of induration  Neuro: normal tone, normal sensation to touch Psych:  normal insight, alert and oriented MSK:  Back: No tenderness to palpation of the lumbar or thoracic midline.  Some tenderness to palpation over the lumbar paraspinal muscles. Able to ambulate 4 steps. Normal hip flexion strength resistance. Negative straight leg raise bilaterally. Neurovascularly intact.  Staple Removal Performed by: Clearance Coots, MD  Consent: Verbal consent obtained. Risks and benefits: risks, benefits and alternatives were discussed Time out: Immediately prior to procedure a "time out" was called to verify the correct patient, procedure, equipment, support staff and site/side marked as required. Body area: Right Occipital scalp and 3 sutures were removed. Wound Appearance: clean  Patient tolerance: Patient tolerated the procedure well with no immediate complications.    ASSESSMENT & PLAN:   Occipital scalp laceration Had a fall recently. Has a history of falls. Laceration repaired 10 days ago.  - staple removal today  - f/u PRN   Low back pain [place] recently. Hit his head and now has some lower back pain. Concern there may be a compression fracture.  - lumbar xray  - would continue current medications for pain.  - could try calcitonin if pain not controlled and evidence of an acute compression fracture.

## 2017-07-28 NOTE — Telephone Encounter (Signed)
Wyline Mood the ok for verbal orders in JJ absence.

## 2017-07-28 NOTE — Patient Instructions (Signed)
Thank you for coming in,   We will call you with the results from today.   Can use tylenol for pain.    Please feel free to call with any questions or concerns at any time, at [phone]. --Dr. Raeford Razor

## 2017-07-28 NOTE — Assessment & Plan Note (Signed)
[address] [address] recently. Hit his head and now has some lower back pain. Concern there may be a compression fracture.  - lumbar xray  - would continue current medications for pain.  - could try calcitonin if pain not controlled and evidence of an acute compression fracture.

## 2017-07-28 NOTE — Telephone Encounter (Signed)
Had a fall and was seen in ER and set home.  Has back pain.   Verbal orders for PT elevation  And OT Verbal orders for :  1x a week for 1 1x 2 2x2  [phone] - Manuela Schwartz

## 2017-07-28 NOTE — Assessment & Plan Note (Signed)
Had a fall recently. Has a history of falls. Laceration repaired 10 days ago.  - staple removal today  - f/u PRN

## 2017-07-29 ENCOUNTER — Telehealth: Payer: Self-pay | Admitting: Family Medicine

## 2017-07-29 NOTE — Telephone Encounter (Signed)
Spoke with patient's daughter about patient's xray results. Shows a new compression fracture. Pain is controlled currently. Could consider getting a DEXA scan going forward.   Rosemarie Ax, MD [facility] 07/29/2017, 3:25 PM

## 2017-08-14 ENCOUNTER — Ambulatory Visit: Payer: Medicare Other | Admitting: Internal Medicine

## 2017-08-21 ENCOUNTER — Emergency Department (HOSPITAL_COMMUNITY): Payer: Medicare Other

## 2017-08-21 ENCOUNTER — Inpatient Hospital Stay (HOSPITAL_COMMUNITY): Payer: Medicare Other

## 2017-08-21 ENCOUNTER — Inpatient Hospital Stay (HOSPITAL_COMMUNITY)
Admission: EM | Admit: 2017-08-21 | Discharge: 2017-08-24 | DRG: 948 | Disposition: A | Discharge status: Skilled Nursing Facility | Payer: Medicare Other | Attending: Internal Medicine | Admitting: Internal Medicine

## 2017-08-21 ENCOUNTER — Other Ambulatory Visit: Payer: Self-pay

## 2017-08-21 ENCOUNTER — Encounter (HOSPITAL_COMMUNITY): Payer: Self-pay | Admitting: *Deleted

## 2017-08-21 DIAGNOSIS — E860 Dehydration: Secondary | ICD-10-CM | POA: Diagnosis not present

## 2017-08-21 DIAGNOSIS — Z86010 Personal history of colonic polyps: Secondary | ICD-10-CM

## 2017-08-21 DIAGNOSIS — Z8673 Personal history of transient ischemic attack (TIA), and cerebral infarction without residual deficits: Secondary | ICD-10-CM

## 2017-08-21 DIAGNOSIS — R350 Frequency of micturition: Secondary | ICD-10-CM | POA: Diagnosis present

## 2017-08-21 DIAGNOSIS — R4182 Altered mental status, unspecified: Principal | ICD-10-CM

## 2017-08-21 DIAGNOSIS — R2681 Unsteadiness on feet: Secondary | ICD-10-CM | POA: Diagnosis not present

## 2017-08-21 DIAGNOSIS — F0150 Vascular dementia without behavioral disturbance: Secondary | ICD-10-CM

## 2017-08-21 DIAGNOSIS — R9082 White matter disease, unspecified: Secondary | ICD-10-CM

## 2017-08-21 DIAGNOSIS — Z8546 Personal history of malignant neoplasm of prostate: Secondary | ICD-10-CM

## 2017-08-21 DIAGNOSIS — R634 Abnormal weight loss: Secondary | ICD-10-CM | POA: Diagnosis not present

## 2017-08-21 DIAGNOSIS — E785 Hyperlipidemia, unspecified: Secondary | ICD-10-CM | POA: Diagnosis present

## 2017-08-21 DIAGNOSIS — I252 Old myocardial infarction: Secondary | ICD-10-CM | POA: Diagnosis not present

## 2017-08-21 DIAGNOSIS — N39498 Other specified urinary incontinence: Secondary | ICD-10-CM | POA: Diagnosis present

## 2017-08-21 DIAGNOSIS — Z801 Family history of malignant neoplasm of trachea, bronchus and lung: Secondary | ICD-10-CM

## 2017-08-21 DIAGNOSIS — Z8249 Family history of ischemic heart disease and other diseases of the circulatory system: Secondary | ICD-10-CM

## 2017-08-21 DIAGNOSIS — Z8041 Family history of malignant neoplasm of ovary: Secondary | ICD-10-CM

## 2017-08-21 DIAGNOSIS — F0390 Unspecified dementia without behavioral disturbance: Secondary | ICD-10-CM | POA: Diagnosis not present

## 2017-08-21 DIAGNOSIS — D638 Anemia in other chronic diseases classified elsewhere: Secondary | ICD-10-CM | POA: Diagnosis not present

## 2017-08-21 DIAGNOSIS — I2510 Atherosclerotic heart disease of native coronary artery without angina pectoris: Secondary | ICD-10-CM | POA: Diagnosis not present

## 2017-08-21 DIAGNOSIS — H9190 Unspecified hearing loss, unspecified ear: Secondary | ICD-10-CM | POA: Diagnosis present

## 2017-08-21 DIAGNOSIS — D649 Anemia, unspecified: Secondary | ICD-10-CM

## 2017-08-21 DIAGNOSIS — I639 Cerebral infarction, unspecified: Secondary | ICD-10-CM | POA: Diagnosis not present

## 2017-08-21 DIAGNOSIS — G8929 Other chronic pain: Secondary | ICD-10-CM | POA: Diagnosis not present

## 2017-08-21 DIAGNOSIS — Z87891 Personal history of nicotine dependence: Secondary | ICD-10-CM

## 2017-08-21 DIAGNOSIS — R410 Disorientation, unspecified: Secondary | ICD-10-CM

## 2017-08-21 DIAGNOSIS — I10 Essential (primary) hypertension: Secondary | ICD-10-CM | POA: Diagnosis present

## 2017-08-21 DIAGNOSIS — T4275XA Adverse effect of unspecified antiepileptic and sedative-hypnotic drugs, initial encounter: Secondary | ICD-10-CM | POA: Diagnosis present

## 2017-08-21 DIAGNOSIS — R531 Weakness: Secondary | ICD-10-CM

## 2017-08-21 DIAGNOSIS — M479 Spondylosis, unspecified: Secondary | ICD-10-CM | POA: Diagnosis present

## 2017-08-21 DIAGNOSIS — N401 Enlarged prostate with lower urinary tract symptoms: Secondary | ICD-10-CM | POA: Diagnosis not present

## 2017-08-21 DIAGNOSIS — Z9049 Acquired absence of other specified parts of digestive tract: Secondary | ICD-10-CM

## 2017-08-21 DIAGNOSIS — S32019D Unspecified fracture of first lumbar vertebra, subsequent encounter for fracture with routine healing: Secondary | ICD-10-CM

## 2017-08-21 DIAGNOSIS — M549 Dorsalgia, unspecified: Secondary | ICD-10-CM

## 2017-08-21 DIAGNOSIS — K219 Gastro-esophageal reflux disease without esophagitis: Secondary | ICD-10-CM | POA: Diagnosis present

## 2017-08-21 DIAGNOSIS — Z955 Presence of coronary angioplasty implant and graft: Secondary | ICD-10-CM

## 2017-08-21 DIAGNOSIS — Z8261 Family history of arthritis: Secondary | ICD-10-CM

## 2017-08-21 DIAGNOSIS — F05 Delirium due to known physiological condition: Secondary | ICD-10-CM

## 2017-08-21 LAB — PSA: Prostatic Specific Antigen: 3.77 ng/mL (ref 0.00–4.00)

## 2017-08-21 LAB — CBC WITH DIFFERENTIAL/PLATELET
BASOS ABS: 0 10*3/uL (ref 0.0–0.1)
BASOS PCT: 0 %
EOS ABS: 0 10*3/uL (ref 0.0–0.7)
EOS PCT: 0 %
HCT: 38.2 % — ABNORMAL LOW (ref 39.0–52.0)
HEMOGLOBIN: 12.7 g/dL — AB (ref 13.0–17.0)
Lymphocytes Relative: 22 %
Lymphs Abs: 1 10*3/uL (ref 0.7–4.0)
MCH: 32.8 pg (ref 26.0–34.0)
MCHC: 33.2 g/dL (ref 30.0–36.0)
MCV: 98.7 fL (ref 78.0–100.0)
Monocytes Absolute: 0.3 10*3/uL (ref 0.1–1.0)
Monocytes Relative: 7 %
NEUTROS PCT: 71 %
Neutro Abs: 3.1 10*3/uL (ref 1.7–7.7)
PLATELETS: 137 10*3/uL — AB (ref 150–400)
RBC: 3.87 MIL/uL — AB (ref 4.22–5.81)
RDW: 14.3 % (ref 11.5–15.5)
WBC: 4.4 10*3/uL (ref 4.0–10.5)

## 2017-08-21 LAB — BLOOD GAS, ARTERIAL
ACID-BASE EXCESS: 0.8 mmol/L (ref 0.0–2.0)
BICARBONATE: 25.3 mmol/L (ref 20.0–28.0)
Drawn by: 519031
FIO2: 21
O2 SAT: 92.9 %
PATIENT TEMPERATURE: 98.6
pCO2 arterial: 41.9 mmHg (ref 32.0–48.0)
pH, Arterial: 7.397 (ref 7.350–7.450)
pO2, Arterial: 69.2 mmHg — ABNORMAL LOW (ref 83.0–108.0)

## 2017-08-21 LAB — URINALYSIS, COMPLETE (UACMP) WITH MICROSCOPIC
BILIRUBIN URINE: NEGATIVE
Bacteria, UA: NONE SEEN
GLUCOSE, UA: NEGATIVE mg/dL
Ketones, ur: NEGATIVE mg/dL
LEUKOCYTES UA: NEGATIVE
NITRITE: NEGATIVE
PH: 6 (ref 5.0–8.0)
Protein, ur: NEGATIVE mg/dL
Specific Gravity, Urine: 1.011 (ref 1.005–1.030)

## 2017-08-21 LAB — RAPID URINE DRUG SCREEN, HOSP PERFORMED
Amphetamines: NOT DETECTED
BARBITURATES: NOT DETECTED
Benzodiazepines: NOT DETECTED
COCAINE: NOT DETECTED
Opiates: NOT DETECTED
TETRAHYDROCANNABINOL: NOT DETECTED

## 2017-08-21 LAB — IRON AND TIBC
Iron: 87 ug/dL (ref 45–182)
SATURATION RATIOS: 29 % (ref 17.9–39.5)
TIBC: 297 ug/dL (ref 250–450)
UIBC: 210 ug/dL

## 2017-08-21 LAB — COMPREHENSIVE METABOLIC PANEL
ALT: 12 U/L — AB (ref 17–63)
AST: 21 U/L (ref 15–41)
Albumin: 3.7 g/dL (ref 3.5–5.0)
Alkaline Phosphatase: 110 U/L (ref 38–126)
Anion gap: 5 (ref 5–15)
BILIRUBIN TOTAL: 0.7 mg/dL (ref 0.3–1.2)
BUN: 21 mg/dL — AB (ref 6–20)
CO2: 28 mmol/L (ref 22–32)
Calcium: 9.1 mg/dL (ref 8.9–10.3)
Chloride: 106 mmol/L (ref 101–111)
Creatinine, Ser: 0.98 mg/dL (ref 0.61–1.24)
Glucose, Bld: 94 mg/dL (ref 65–99)
POTASSIUM: 4 mmol/L (ref 3.5–5.1)
Sodium: 139 mmol/L (ref 135–145)
TOTAL PROTEIN: 6.8 g/dL (ref 6.5–8.1)

## 2017-08-21 LAB — FOLATE: Folate: 78 ng/mL (ref >5.9)

## 2017-08-21 LAB — TSH: TSH: 5.916 u[IU]/mL — ABNORMAL HIGH (ref 0.350–4.500)

## 2017-08-21 LAB — RETICULOCYTES
RBC.: 4 MIL/uL — AB (ref 4.22–5.81)
RETIC CT PCT: 0.9 % (ref 0.4–3.1)
Retic Count, Absolute: 36 10*3/uL (ref 19.0–186.0)

## 2017-08-21 LAB — FERRITIN: FERRITIN: 105 ng/mL (ref 24–336)

## 2017-08-21 LAB — ETHANOL: Alcohol, Ethyl (B): <10 mg/dL (ref <10)

## 2017-08-21 LAB — TROPONIN I

## 2017-08-21 LAB — CBG MONITORING, ED: GLUCOSE-CAPILLARY: 90 mg/dL (ref 65–99)

## 2017-08-21 LAB — VITAMIN B12: VITAMIN B 12: 560 pg/mL (ref 180–914)

## 2017-08-21 MED ORDER — ASPIRIN EC 81 MG PO TBEC
81.0000 mg | DELAYED_RELEASE_TABLET | Freq: Every day | ORAL | Status: DC
  Administered 2017-08-22 – 2017-08-23 (×2): 81 mg via ORAL
  Filled 2017-08-21 (×2): qty 1

## 2017-08-21 MED ORDER — MORPHINE SULFATE (PF) 2 MG/ML IV SOLN: 2.0000 mg | Freq: Once | INTRAVENOUS | Status: DC

## 2017-08-21 MED ORDER — MORPHINE SULFATE (PF) 4 MG/ML IV SOLN
2.0000 mg | INTRAVENOUS | Status: AC
  Administered 2017-08-21: 2 mg via INTRAVENOUS
  Filled 2017-08-21: qty 1

## 2017-08-21 MED ORDER — HALOPERIDOL LACTATE 5 MG/ML IJ SOLN
2.0000 mg | Freq: Once | INTRAMUSCULAR | Status: AC
  Administered 2017-08-21: 2 mg via INTRAVENOUS
  Filled 2017-08-21: qty 1

## 2017-08-21 MED ORDER — THIAMINE HCL 100 MG/ML IJ SOLN
Freq: Once | INTRAVENOUS | Status: AC
  Administered 2017-08-21: 18:00:00 via INTRAVENOUS
  Filled 2017-08-21: qty 1000

## 2017-08-21 MED ORDER — MORPHINE SULFATE (PF) 4 MG/ML IV SOLN
INTRAVENOUS | Status: AC
  Administered 2017-08-21: 4 mg
  Filled 2017-08-21: qty 1

## 2017-08-21 MED ORDER — HALOPERIDOL LACTATE 5 MG/ML IJ SOLN
3.0000 mg | Freq: Once | INTRAMUSCULAR | Status: AC
  Administered 2017-08-21: 3 mg via INTRAVENOUS
  Filled 2017-08-21: qty 1

## 2017-08-21 MED ORDER — LORAZEPAM 2 MG/ML IJ SOLN: 4.0000 mg | Freq: Once | INTRAMUSCULAR | Status: DC | PRN

## 2017-08-21 MED ORDER — SODIUM CHLORIDE 0.9 % IV SOLN
INTRAVENOUS | Status: DC
  Administered 2017-08-21: 12:00:00 via INTRAVENOUS

## 2017-08-21 MED ORDER — HEPARIN SODIUM (PORCINE) 5000 UNIT/ML IJ SOLN
5000.0000 [IU] | Freq: Three times a day (TID) | INTRAMUSCULAR | Status: DC
  Administered 2017-08-22 – 2017-08-24 (×6): 5000 [IU] via SUBCUTANEOUS
  Filled 2017-08-21 (×6): qty 1

## 2017-08-21 NOTE — ED Provider Notes (Signed)
[facility] Provider Note   CSN: [id number] Arrival date & time: 08/21/17  1047     History   Chief Complaint Chief Complaint  Patient presents with  . Altered Mental Status    HPI Eric Cooley is a 83 y.o. male.  HPI According to the nursing notes the patient came in by EMS from home for evaluation of confusion, altered mental status.  Yesterday the daughter noticed that the patient did not recognize her.  This was very unusual.  In the emergency room patient denies any specific complaints.  He is not able to clearly tell me why he is in the emergency room.  He denies any trouble with headache.  He denies any chest pain or shortness of breath.  No fevers or chills.  No vomiting or diarrhea Past Medical History:  Diagnosis Date  . Allergic rhinitis, cause unspecified 03/29/2011  . Anemia 05/2009   blood loss anemia, transfused 6 units.   . Arthritis   . BPH (benign prostatic hypertrophy) 03/29/2011  . CAD (coronary artery disease) 05/2012  . Colon polyp 05/2012   cecal, ascending tubular adenomas.   . DJD (degenerative joint disease), lumbar 04/01/2011  . Gastrointestinal stromal tumor (GIST) of stomach ([place]) 05/2009, 10/2009   2010: gastric ulcer with GIST: IR embolization left gastric artery.  2011: partial gastrectomy  . GERD (gastroesophageal reflux disease) 03/29/2011  . History of nuclear stress test 03/27/2010   exercise; no ischemic changes, normal study   . HTN (hypertension) 03/29/2011  . Hyperlipidemia 03/29/2011  . Low back pain   . MI (myocardial infarction) ([place])   . Prostate cancer ([place]) 03/29/2011  . S/P cholecystectomy 03/29/2011  . 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 Urine incontinence     Patient Active Problem List   Diagnosis Date Noted  . Altered mental status 08/21/2017  . Occipital scalp laceration 07/28/2017  . Allergic conjunctivitis 06/25/2017  . Low back pain 05/15/2017    . Bilateral leg pain 12/05/2016  . Right shoulder pain 12/05/2016  . CAP (community acquired pneumonia) 04/11/2016  . 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 Right flank pain, chronic 06/08/2015  . 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 Thoracic scoliosis 03/30/2015  . Facet syndrome, lumbar ([place]) 03/30/2015  . Myofascial pain on right side 03/30/2015  . Mild dementia 03/14/2015  . Abnormal LFTs   . Chest pain 03/10/2015  . Left shoulder pain 02/01/2015  . Pleuritic chest pain 02/01/2015  . Abnormal CXR 12/16/2014  . Chronic back pain 12/16/2014  . Unsteady gait 12/02/2014  . Memory loss 07/19/2013  . Loss of weight 07/16/2013  . Chest pain with low risk for cardiac etiology 07/12/2013  . Protein-calorie malnutrition, severe ([place]) 07/12/2013  . Subacute confusional state 05/28/2013  . Gait disorder 05/28/2013  . Subarachnoid hemorrhage following injury ([place]) 04/16/2013  . Shoulder dislocation-right 04/16/2013  . Intermittent low back pain 04/15/2012  . Constipation 03/04/2012  . Dysuria 03/04/2012  . Pneumonia 01/29/2012  . Dizziness 12/18/2011  . Balance problems 12/18/2011  . Pre-ulcerative corn or callous 10/13/2011  . DJD (degenerative joint disease), lumbar 04/01/2011  . CAD (coronary artery disease) 03/29/2011  . Prostate cancer ([place]) 03/29/2011  . 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 HTN (hypertension) 03/29/2011  . Hyperlipidemia 03/29/2011  . BPH (benign prostatic hypertrophy) 03/29/2011  . GERD (gastroesophageal reflux disease) 03/29/2011  . Encounter for well adult exam with abnormal findings  03/29/2011  . S/P cholecystectomy 03/29/2011  . Allergic rhinitis 03/29/2011  . S/P partial gastrectomy 03/29/2011  . Anemia 03/29/2011    Past Surgical History:  Procedure Laterality Date  . CHOLECYSTECTOMY  1986  . CORONARY ANGIOPLASTY WITH STENT PLACEMENT  04/24/2009   2 Xience DES to LAD 3.5x6mm and 3.0x34mm  overlapping  . gastric artery emoblization Left 05/2009  . PARTIAL GASTRECTOMY  10/2009   GIST  . TONSILLECTOMY  1951  . TRANSTHORACIC ECHOCARDIOGRAM  10/09/2009   EF 55-60%; normaml LV systolic function with grade 1 diastolic dysfunction       Home Medications    Prior to Admission medications   Medication Sig Start Date End Date Taking? Authorizing Provider  HYDROcodone-acetaminophen (NORCO/VICODIN) 5-325 MG tablet Take 1-2 tablets every 4 (four) hours as needed by mouth for pain. 07/09/17  Yes [provider]  Memantine HCl-Donepezil HCl (NAMZARIC) 28-10 MG CP24 Take 1 tablet by mouth daily. 05/14/16  Yes Biagio Borg, MD  traMADol (ULTRAM) 50 MG tablet Take 1 tablet (50 mg total) by mouth 3 (three) times daily as needed. 07/09/17  Yes Biagio Borg, MD  docusate sodium (COLACE) 100 MG capsule Take 1 capsule (100 mg total) by mouth every 12 (twelve) hours. Patient not taking: Reported on 08/21/2017 02/14/17   Charlesetta Shanks, MD  fexofenadine (ALLEGRA) 180 MG tablet Take 1 tablet (180 mg total) by mouth daily. Patient not taking: Reported on 08/21/2017 12/05/16 12/05/17  Biagio Borg, MD    Family History Family History  Problem Relation Age of Onset  . Arthritis Mother   . Ovarian cancer Mother   . Heart failure Mother   . Arthritis Father   . Lung cancer Father   . Colon cancer Neg Hx     Social History Social History   Tobacco Use  . Smoking status: Former Smoker    Last attempt to quit: 10/07/1960    Years since quitting: 56.9  . Smokeless tobacco: Never Used  . Tobacco comment: Stopped smoking in 1962  Substance Use Topics  . Alcohol use: Yes    Alcohol/week: 0.6 oz    Types: 1 Glasses of wine per week    Comment: very rare  . Drug use: No     Allergies   Patient has no known allergies.   Review of Systems Review of Systems  All other systems reviewed and are negative.    Physical Exam Updated Vital Signs BP (!) 157/68 (BP Location: Left Arm)    Pulse (!) 105   Temp 98.5 F (36.9 C) (Oral)   Resp 16   Ht 1.727 m (5\' 8" )   Wt 57.2 kg (126 lb 1.7 oz)   SpO2 96%   BMI 19.17 kg/m   Physical Exam  Constitutional: No distress.  Elderly, frail  HENT:  Head: Normocephalic and atraumatic.  Right Ear: External ear normal.  Left Ear: External ear normal.  Eyes: Conjunctivae are normal. Right eye exhibits no discharge. Left eye exhibits no discharge. No scleral icterus.  Neck: Neck supple. No tracheal deviation present.  Cardiovascular: Normal rate, regular rhythm and intact distal pulses.  Pulmonary/Chest: Effort normal and breath sounds normal. No stridor. No respiratory distress. He has no wheezes. He has no rales.  Abdominal: Soft. Bowel sounds are normal. He exhibits no distension. There is no tenderness. There is no rebound and no guarding.  Musculoskeletal: He exhibits no edema or tenderness.  Neurological: He is alert. He has normal strength. He is disoriented.  No cranial nerve deficit (no facial droop, extraocular movements intact, no slurred speech) or sensory deficit. He exhibits normal muscle tone. He displays no seizure activity. Coordination normal. GCS eye subscore is 4. GCS verbal subscore is 4. GCS motor subscore is 6.  Patient is able to follow commands, he is moving all extremities equally.   Patient answers questions appropriately however his speech is confused, rambling and tangential.  Skin: Skin is warm and dry. No rash noted.  Psychiatric: He has a normal mood and affect.  Nursing note and vitals reviewed.    ED Treatments / Results  Labs (all labs ordered are listed, but only abnormal results are displayed) Labs Reviewed  COMPREHENSIVE METABOLIC PANEL - Abnormal; Notable for the following components:      Result Value   BUN 21 (*)    ALT 12 (*)    All other components within normal limits  CBC WITH DIFFERENTIAL/PLATELET - Abnormal; Notable for the following components:   RBC 3.87 (*)    Hemoglobin 12.7  (*)    HCT 38.2 (*)    Platelets 137 (*)    All other components within normal limits  URINALYSIS, COMPLETE (UACMP) WITH MICROSCOPIC - Abnormal; Notable for the following components:   Hgb urine dipstick MODERATE (*)    Squamous Epithelial / LPF 0-5 (*)    All other components within normal limits  TSH - Abnormal; Notable for the following components:   TSH 5.916 (*)    All other components within normal limits  RETICULOCYTES - Abnormal; Notable for the following components:   RBC. 4.00 (*)    All other components within normal limits  COMPREHENSIVE METABOLIC PANEL - Abnormal; Notable for the following components:   Calcium 8.8 (*)    Total Protein 6.2 (*)    Albumin 3.4 (*)    All other components within normal limits  CBC WITH DIFFERENTIAL/PLATELET - Abnormal; Notable for the following components:   RBC 3.87 (*)    Hemoglobin 12.6 (*)    HCT 38.6 (*)    Platelets 132 (*)    All other components within normal limits  BLOOD GAS, ARTERIAL - Abnormal; Notable for the following components:   pO2, Arterial 69.2 (*)    All other components within normal limits  URINE CULTURE  CULTURE, BLOOD (ROUTINE X 2)  CULTURE, BLOOD (ROUTINE X 2)  ETHANOL  VITAMIN B12  FOLATE  IRON AND TIBC  FERRITIN  RAPID URINE DRUG SCREEN, HOSP PERFORMED  MAGNESIUM  PSA  TROPONIN I  TROPONIN I  TROPONIN I  CBG MONITORING, ED    EKG  EKG Interpretation  Date/Time:  Thursday August 21 2017 15:36:50 EST Ventricular Rate:  93 PR Interval:  184 QRS Duration: 76 QT Interval:  370 QTC Calculation: 460 R Axis:   30 Text Interpretation:  Sinus rhythm with Premature supraventricular complexes Otherwise normal ECG No significant change since last tracing Confirmed by Dorie Rank [id number]) on 08/21/2017 3:42:41 PM       Radiology Ct Head Wo Contrast  Result Date: 08/21/2017 CLINICAL DATA:  Altered level of consciousness. EXAM: CT HEAD WITHOUT CONTRAST TECHNIQUE: Contiguous axial images were  obtained from the base of the skull through the vertex without intravenous contrast. COMPARISON:  CT 04/17/2013 FINDINGS: Brain: Progression of generalized atrophy. Progressive white matter hypodensity diffusely. New area of encephalomalacia right lateral temporal lobe compatible with chronic infarct. Negative for acute infarct, hemorrhage, or mass lesion. Vascular: Negative for hyperdense vessel Skull: Negative Sinuses/Orbits: Negative Other: None  IMPRESSION: No acute abnormality Progression of atrophy and progression of chronic ischemic change since 2014. Electronically Signed   By: Franchot Gallo M.D.   On: 08/21/2017 12:04   Dg Chest Port 1 View  Result Date: 08/21/2017 CLINICAL DATA:  Altered mental status EXAM: PORTABLE CHEST 1 VIEW COMPARISON:  04/23/2016 FINDINGS: Mildly low lung volumes. Mild atelectasis at the left base. No focal consolidation. Heart size upper normal. Aortic atherosclerosis. No pneumothorax. Old deformity distal left clavicle. IMPRESSION: Low lung volumes with minimal atelectasis at the left base. Electronically Signed   By: Donavan Foil M.D.   On: 08/21/2017 15:27     Procedures Procedures (including critical care time)  Medications Ordered in ED Medications  0.9 %  sodium chloride infusion ( Intravenous Restarted 08/21/17 2030)  heparin injection 5,000 Units (5,000 Units Subcutaneous Given 08/22/17 0553)  aspirin EC tablet 81 mg (81 mg Oral Not Given 08/21/17 2030)  LORazepam (ATIVAN) injection 4 mg (not administered)  haloperidol lactate (HALDOL) injection 2 mg (2 mg Intravenous Given 08/21/17 1431)  sodium chloride 0.9 % 1,000 mL with thiamine 007 mg, folic acid 1 mg, multivitamins adult 10 mL infusion ( Intravenous New Bag/Given 08/21/17 1820)  haloperidol lactate (HALDOL) injection 3 mg (3 mg Intravenous Given 08/21/17 1718)  morphine 4 MG/ML injection 2 mg (2 mg Intravenous Given 08/21/17 1718)  morphine 4 MG/ML injection (4 mg  Given 08/21/17 1738)      Initial Impression / Assessment and Plan / ED Course  I have reviewed the triage vital signs and the nursing notes.  Pertinent labs & imaging results that were available during my care of the patient were reviewed by me and considered in my medical decision making (see chart for details).  Clinical Course as of Aug 23 703  Thu Aug 21, 2017  1115 Reviewed prior records. Head injury noted last month.  Hx of compression fx after a fall.  [MA]  2633 Discussed history with daughters.  One of his daughters is a neuropsychiatritst.  She states this is an abrupt change in his behavior over the last few days.  He has been getting confused but not to this degree in the past.  [JK]    Clinical Course User Index [JK] Dorie Rank, MD    Pt presented to the ED with increasing confusion, agitation.  Per family, pt is normally high fucntioning.  This change is rather abrupt in the past week.   As pt remained in the ED, he became more confused, trying to get out of bed, picking at the monitor leads, requiring redirection.  Dose of haldol given.  No acute findings noted on labs, head ct. No CNS bleed, no signs of acute infection. Considering his abrupt changed will consult with medical service for admission, further workup.  Final Clinical Impressions(s) / ED Diagnoses   Final diagnoses:  Delirium      Dorie Rank, MD 08/22/17 [phone]

## 2017-08-21 NOTE — ED Triage Notes (Signed)
Per EMS, pt from home w/ hx of dementia here for AMS since yesterday. Pt daughter states the pt was unable to recognize her yesterday, pt was able to recognize them today.  BP 173/73 HR 90-120 irregular O2 98 CBG 99 RR 16

## 2017-08-21 NOTE — Clinical Social Work Note (Addendum)
Clinical Social Work Assessment  Patient Details  Name: Eric Cooley MRN: [id number] Date of Birth: Nov 02, 1919  Date of referral:  08/21/17               Reason for consult:  Facility Placement                Permission sought to share information with:  Chartered certified accountant granted to share information::  Yes, Verbal Permission Granted  Name::        Agency::     Relationship::     Contact Information:     Housing/Transportation Living arrangements for the past 2 months:  Single Family Home Source of Information:  Adult Children Patient Interpreter Needed:  None Criminal Activity/Legal Involvement Pertinent to Current Situation/Hospitalization:    Significant Relationships:  Adult Children Lives with:  (Unknown) Do you feel safe going back to the place where you live?  No Need for family participation in patient care:  Yes (Comment)  Care giving concerns:  Pt per family is currently "delirious" and not A&O X4.  Patient's family hopes for SNF placement, CSW educated family pt may not qualify for SNF due to delirium and/or other reasons for pt being admitted, pt's family is aware pt may not qualify, family aware.    Social Worker assessment / plan:  CSW met with pt's family, pt is not oriented and confirmed pt's families wishes for pt to be discharged to SNF.  CSW provided active listening and validated pt's family's concerns.   Pt's family gave verbal permission for CSW DEPT to complete FL-2 and send referrals out to [facility] facilities via the hub per pt's family's request.  Pt has been living independently prior to being admitted to [facility], per chart. Pt's family may be interested in [place] area ANF's at a later date.  Pt has been at [facility] before and family considers tha to be an option IF PT QUALIFIES for SNF placement.   Employment status:  Retired Nurse, adult PT Recommendations:  Not assessed at this  time Information / Referral to community resources:     Patient/Family's Response to care:  Patient not alert and oriented.  Patient's family hopes for SNF placement, CSW educated family pt may not qualify for SNF due to delirium, pt's family is aware pt may not qualify, family aware.  Pt's daughters supportive and strongly involved in pt.'s care.  Pt.'s  daughters pleasant and appreciated CSW intervention.      Patient/Family's Understanding of and Emotional Response to Diagnosis, Current Treatment, and Prognosis:  Still assessing  Emotional Assessment Appearance:  Appears younger than stated age Attitude/Demeanor/Rapport:    Affect (typically observed):  Accepting, Adaptable, Pleasant, Calm Orientation:  Fluctuating Orientation (Suspected and/or reported Sundowners) Alcohol / Substance use:    Psych involvement (Current and /or in the community):     Discharge Needs  Concerns to be addressed:  No discharge needs identified Readmission within the last 30 days:  No Current discharge risk:  None Barriers to Discharge:  No Barriers Identified   Claudine Mouton, LCSWA 08/21/2017, 4:48 PM

## 2017-08-21 NOTE — ED Notes (Signed)
Attempted lab draw but was unsuccessful

## 2017-08-21 NOTE — H&P (Signed)
[facility] History and Physical  Eric Cooley DQQ:[id number] DOB: [birth year] DOA: 08/21/2017  Referring physician:  PCP: Biagio Borg, MD   Chief Complaint: Altered mental status  HPI: Eric Cooley is a 83 y.o. WM PMHx  CVA (cerebellar), moderate to severe White Matter Disease, Anemia, BPH, MI, CAD, HTN, HLD, Colon polyp, Gastrointestinal stromal tumor (GIST) of stomach, DJD, Prostate cancer.   According to the nursing notes the patient came in by EMS from home for evaluation of confusion, altered mental status. Yesterday the daughter noticed that the patient did not recognize her. This was very unusual. In the emergency room patient denies any specific complaints. He is not able to clearly tell me why he is in the emergency room. He denies any trouble with headache. He denies any chest pain or shortness of breath. No fevers or chills. No vomiting or diarrhea       Review of Systems:  Constitutional:  Positive weight loss, negative night sweats, negative Fevers, negative chills, fatigue.  HEENT:  Negative headaches, Difficulty swallowing,Tooth/dental problems,Sore throat,  No sneezing, itching, ear ache, nasal congestion, post nasal drip,  Cardio-vascular:  Negative chest pain, Orthopnea, PND, swelling in lower extremities, anasarca, dizziness, palpitations  GI:  No heartburn, indigestion, abdominal pain, nausea, vomiting, diarrhea, change in bowel habits, loss of appetite  Resp:  No shortness of breath with exertion or at rest. No excess mucus, no productive cough, No non-productive cough, No coughing up of blood.No change in color of mucus.No wheezing.No chest wall deformity  Skin:  no rash or lesions.  GU:  no dysuria, change in color of urine, no urgency or frequency. No flank pain.  Musculoskeletal:  No joint pain or swelling. No decreased range of motion. No back pain.  Psych:  Positive change in mood or affect. No depression or anxiety. Positive memory loss  (does not recall one of his daughters name) positive delusions (seen people in the room that are not present) .   Past Medical History:  Diagnosis Date  . Allergic rhinitis, cause unspecified 03/29/2011  . Anemia 05/2009   blood loss anemia, transfused 6 units.   . Arthritis   . BPH (benign prostatic hypertrophy) 03/29/2011  . CAD (coronary artery disease) 05/2012  . Colon polyp 05/2012   cecal, ascending tubular adenomas.   . DJD (degenerative joint disease), lumbar 04/01/2011  . Gastrointestinal stromal tumor (GIST) of stomach ([place]) 05/2009, 10/2009   2010: gastric ulcer with GIST: IR embolization left gastric artery.  2011: partial gastrectomy  . GERD (gastroesophageal reflux disease) 03/29/2011  . History of nuclear stress test 03/27/2010   exercise; no ischemic changes, normal study   . HTN (hypertension) 03/29/2011  . Hyperlipidemia 03/29/2011  . Low back pain   . MI (myocardial infarction) ([place])   . Prostate cancer ([place]) 03/29/2011  . S/P cholecystectomy 03/29/2011  . 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 Urine incontinence    Past Surgical History:  Procedure Laterality Date  . CHOLECYSTECTOMY  1986  . CORONARY ANGIOPLASTY WITH STENT PLACEMENT  04/24/2009   2 Xience DES to LAD 3.5x24mm and 3.0x57mm overlapping  . gastric artery emoblization Left 05/2009  . PARTIAL GASTRECTOMY  10/2009   GIST  . TONSILLECTOMY  1951  . TRANSTHORACIC ECHOCARDIOGRAM  10/09/2009   EF 55-60%; normaml LV systolic function with grade 1 diastolic dysfunction   Social History:  reports that he quit smoking about 56 years ago. he has never used smokeless tobacco. He reports  that he drinks about 0.6 oz of alcohol per week. He reports that he does not use drugs.  No Known Allergies  Family History  Problem Relation Age of Onset  . Arthritis Mother   . Ovarian cancer Mother   . Heart failure Mother   . Arthritis Father   . Lung cancer Father   . Colon cancer Neg Hx      Prior to Admission medications   Medication Sig Start Date End Date Taking? Authorizing Provider  HYDROcodone-acetaminophen (NORCO/VICODIN) 5-325 MG tablet Take 1-2 tablets every 4 (four) hours as needed by mouth for pain. 07/09/17  Yes [provider]  Memantine HCl-Donepezil HCl (NAMZARIC) 28-10 MG CP24 Take 1 tablet by mouth daily. 05/14/16  Yes Biagio Borg, MD  traMADol (ULTRAM) 50 MG tablet Take 1 tablet (50 mg total) by mouth 3 (three) times daily as needed. 07/09/17  Yes Biagio Borg, MD  docusate sodium (COLACE) 100 MG capsule Take 1 capsule (100 mg total) by mouth every 12 (twelve) hours. Patient not taking: Reported on 08/21/2017 02/14/17   Charlesetta Shanks, MD  fexofenadine (ALLEGRA) 180 MG tablet Take 1 tablet (180 mg total) by mouth daily. Patient not taking: Reported on 08/21/2017 12/05/16 12/05/17  Biagio Borg, MD     Consultants:  None  Procedures/Significant Events:  11/15 CT head: -No acute abnormality -Progression of atrophy and progression of chronic ischemic change since 2014.   I have personally reviewed and interpreted all radiology studies and my findings are as above.   VENTILATOR SETTINGS:    Cultures 11/15 blood pending 11/15 urine pending  Antimicrobials: None   Devices    LINES / TUBES:      Continuous Infusions: . sodium chloride 125 mL/hr at 08/21/17 1138    Physical Exam: Vitals:   08/21/17 1100 08/21/17 1405  BP:  (!) 142/63  Pulse:  64  Resp:  16  Temp: 98.6 F (37 C)   TempSrc: Oral   SpO2:  95%    Wt Readings from Last 3 Encounters:  07/28/17 127 lb (57.6 kg)  06/25/17 130 lb (59 kg)  05/29/17 124 lb (56.2 kg)    General: A/O 2 (does not know where, why), follows most commands. 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 No acute respiratory distress Neck:  Negative scars, masses, torticollis, lymphadenopathy, JVD Lungs: Clear to auscultation bilaterally without wheezes or  crackles Cardiovascular: Regular rate and rhythm without murmur gallop or rub normal S1 and S2 Abdomen: negative abdominal pain, nondistended, positive soft, bowel sounds, no rebound, no ascites, no appreciable mass Extremities: No significant cyanosis, clubbing, or edema bilateral lower extremities Skin: Negative rashes, lesions, ulcers Psychiatric:  Negative depression, negative anxiety, negative fatigue, negative mania  Central nervous system:  Cranial nerves II through XII intact, tongue/uvula midline, all extremities muscle strength 5/5, sensation intact throughout,negative dysarthria, negative expressive aphasia, negative receptive aphasia.        Labs on Admission:  Basic Metabolic Panel: Recent Labs  Lab 08/21/17 1313  NA 139  K 4.0  CL 106  CO2 28  GLUCOSE 94  BUN 21*  CREATININE 0.98  CALCIUM 9.1   Liver Function Tests: Recent Labs  Lab 08/21/17 1313  AST 21  ALT 12*  ALKPHOS 110  BILITOT 0.7  PROT 6.8  ALBUMIN 3.7   No results for input(s): LIPASE, AMYLASE in the last 168 hours. No results for input(s): AMMONIA in the last 168 hours.  CBC: Recent Labs  Lab 08/21/17 1313  WBC 4.4  NEUTROABS 3.1  HGB 12.7*  HCT 38.2*  MCV 98.7  PLT 137*   Cardiac Enzymes: No results for input(s): CKTOTAL, CKMB, CKMBINDEX, TROPONINI in the last 168 hours.  BNP (last 3 results) No results for input(s): BNP in the last 8760 hours.  ProBNP (last 3 results) No results for input(s): PROBNP in the last 8760 hours.  CBG: Recent Labs  Lab 08/21/17 1316  GLUCAP 90    Radiological Exams on Admission: Ct Head Wo Contrast  Result Date: 08/21/2017 CLINICAL DATA:  Altered level of consciousness. EXAM: CT HEAD WITHOUT CONTRAST TECHNIQUE: Contiguous axial images were obtained from the base of the skull through the vertex without intravenous contrast. COMPARISON:  CT 04/17/2013 FINDINGS: Brain: Progression of generalized atrophy. Progressive white matter hypodensity  diffusely. New area of encephalomalacia right lateral temporal lobe compatible with chronic infarct. Negative for acute infarct, hemorrhage, or mass lesion. Vascular: Negative for hyperdense vessel Skull: Negative Sinuses/Orbits: Negative Other: None IMPRESSION: No acute abnormality Progression of atrophy and progression of chronic ischemic change since 2014. Electronically Signed   By: Franchot Gallo M.D.   On: 08/21/2017 12:04    EKG: Independently reviewed. Pending  Assessment/Plan Active Problems:   * No active hospital problems. *  Dementia -Per Dr. Tamala Julian (daughter), patient on Namzaric 1 tablet daily,   Altered mental status -Unknown cause, CVA, MI, vitamin deficiency, thyroid deficiency, infection, electrolyte abnormality? -CT head: Negative for acute abnormality. -MR brain pending -TSH pending -UDS pending -Infection workup -Troponin 3 pending.  -ABG pending -Echocardiogram pending -Patient on minimal medication to include Tramadol, and Norco. However these medications are dispensed by her sister and patient would not have access to accidentally take increased amount.  CVA/White Matter Disease -Review of patient's EMR shows visit to [facility] on 01/01/2012 Dr. Neena Rhymes. Per  note patient had previous cerebellar infarctions, moderate to severe W M.D. Visit had been preceded by episodes of difficulty walking  -See altered mental status  Anemia -Anemia panel pending     Code Status: Full  (DVT Prophylaxis: Subcutaneous heparin Family Communication: Dr. Tamala Julian (daughter/HCPOA), as well as other daughter present for discussion of plan of care  Disposition Plan: TBD    Data Reviewed: Care during the described time interval was provided by me .  I have reviewed this patient's available data, including medical history, events of note, physical examination, and all test results as part of my evaluation.   Time spent: 60 min  [place] [place]  [facility] Pager [phone]

## 2017-08-21 NOTE — Progress Notes (Signed)
ANTICOAGULATION CONSULT NOTE - Initial Consult  Pharmacy Consult for heparin Indication: VTE prophylaxis  No Known Allergies  Patient Measurements: Height: 5\' 8"  (172.7 cm) Weight: 126 lb 1.7 oz (57.2 kg) IBW/kg (Calculated) : 68.4   Vital Signs: Temp: 97.9 F (36.6 C) (11/15 1700) Temp Source: Oral (11/15 1700) BP: 170/86 (11/15 1700) Pulse Rate: 96 (11/15 1700)  Labs: Recent Labs    08/21/17 1313  HGB 12.7*  HCT 38.2*  PLT 137*  CREATININE 0.98    Estimated Creatinine Clearance: 36.5 mL/min (by C-G formula based on SCr of 0.98 mg/dL).   Medical History: Past Medical History:  Diagnosis Date  . Allergic rhinitis, cause unspecified 03/29/2011  . Anemia 05/2009   blood loss anemia, transfused 6 units.   . Arthritis   . BPH (benign prostatic hypertrophy) 03/29/2011  . CAD (coronary artery disease) 05/2012  . Colon polyp 05/2012   cecal, ascending tubular adenomas.   . DJD (degenerative joint disease), lumbar 04/01/2011  . Gastrointestinal stromal tumor (GIST) of stomach ([place]) 05/2009, 10/2009   2010: gastric ulcer with GIST: IR embolization left gastric artery.  2011: partial gastrectomy  . GERD (gastroesophageal reflux disease) 03/29/2011  . History of nuclear stress test 03/27/2010   exercise; no ischemic changes, normal study   . HTN (hypertension) 03/29/2011  . Hyperlipidemia 03/29/2011  . Low back pain   . MI (myocardial infarction) ([place])   . Prostate cancer ([place]) 03/29/2011  . S/P cholecystectomy 03/29/2011  . 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 Urine incontinence      Assessment: 83 y.o. WM PMHx  CVA (cerebellar), moderate to severe White Matter Disease, Anemia, BPH, MI, CAD, HTN, HLD, Colon polyp, Gastrointestinal stromal tumor (GIST) of stomach, DJD, Prostate cancer.  Presented by EMS for confusion and altered mental status.  Pharmacy consulted to dose heparin for VTE prophylaxis.   Goal of Therapy:  Appropriate heparin  prophylaxis based on age and weight  Plan:  Continue MD ordered heparin 5000 units SQ q8h Pharmacy will sign off  Dolly Rias RPh 08/21/2017, 6:25 PM Pager [phone]

## 2017-08-21 NOTE — ED Notes (Addendum)
Tried to obtain to blood but was unsuccessful. RN notified.

## 2017-08-22 ENCOUNTER — Inpatient Hospital Stay (HOSPITAL_COMMUNITY): Payer: Medicare Other

## 2017-08-22 DIAGNOSIS — G20 Parkinson's disease: Secondary | ICD-10-CM | POA: Diagnosis not present

## 2017-08-22 DIAGNOSIS — R410 Disorientation, unspecified: Secondary | ICD-10-CM | POA: Diagnosis not present

## 2017-08-22 DIAGNOSIS — I10 Essential (primary) hypertension: Secondary | ICD-10-CM | POA: Diagnosis not present

## 2017-08-22 DIAGNOSIS — M545 Low back pain: Secondary | ICD-10-CM | POA: Diagnosis not present

## 2017-08-22 DIAGNOSIS — F05 Delirium due to known physiological condition: Secondary | ICD-10-CM | POA: Diagnosis not present

## 2017-08-22 DIAGNOSIS — R531 Weakness: Secondary | ICD-10-CM | POA: Diagnosis not present

## 2017-08-22 DIAGNOSIS — R2681 Unsteadiness on feet: Secondary | ICD-10-CM | POA: Diagnosis not present

## 2017-08-22 DIAGNOSIS — F0281 Dementia in other diseases classified elsewhere with behavioral disturbance: Secondary | ICD-10-CM

## 2017-08-22 DIAGNOSIS — R4182 Altered mental status, unspecified: Secondary | ICD-10-CM | POA: Diagnosis not present

## 2017-08-22 DIAGNOSIS — G8929 Other chronic pain: Secondary | ICD-10-CM | POA: Diagnosis not present

## 2017-08-22 LAB — COMPREHENSIVE METABOLIC PANEL
ALT: 18 U/L (ref 17–63)
ANION GAP: 5 (ref 5–15)
AST: 30 U/L (ref 15–41)
Albumin: 3.4 g/dL — ABNORMAL LOW (ref 3.5–5.0)
Alkaline Phosphatase: 108 U/L (ref 38–126)
BUN: 15 mg/dL (ref 6–20)
CHLORIDE: 109 mmol/L (ref 101–111)
CO2: 27 mmol/L (ref 22–32)
CREATININE: 0.97 mg/dL (ref 0.61–1.24)
Calcium: 8.8 mg/dL — ABNORMAL LOW (ref 8.9–10.3)
Glucose, Bld: 91 mg/dL (ref 65–99)
POTASSIUM: 3.6 mmol/L (ref 3.5–5.1)
SODIUM: 141 mmol/L (ref 135–145)
Total Bilirubin: 1.2 mg/dL (ref 0.3–1.2)
Total Protein: 6.2 g/dL — ABNORMAL LOW (ref 6.5–8.1)

## 2017-08-22 LAB — MAGNESIUM: MAGNESIUM: 1.9 mg/dL (ref 1.7–2.4)

## 2017-08-22 LAB — TROPONIN I: Troponin I: <0.03 ng/mL (ref <0.03)

## 2017-08-22 LAB — CBC WITH DIFFERENTIAL/PLATELET
BASOS ABS: 0 10*3/uL (ref 0.0–0.1)
Basophils Relative: 0 %
Eosinophils Absolute: 0.1 10*3/uL (ref 0.0–0.7)
Eosinophils Relative: 1 %
HCT: 38.6 % — ABNORMAL LOW (ref 39.0–52.0)
HEMOGLOBIN: 12.6 g/dL — AB (ref 13.0–17.0)
LYMPHS ABS: 1.7 10*3/uL (ref 0.7–4.0)
LYMPHS PCT: 35 %
MCH: 32.6 pg (ref 26.0–34.0)
MCHC: 32.6 g/dL (ref 30.0–36.0)
MCV: 99.7 fL (ref 78.0–100.0)
Monocytes Absolute: 0.5 10*3/uL (ref 0.1–1.0)
Monocytes Relative: 11 %
NEUTROS PCT: 53 %
Neutro Abs: 2.5 10*3/uL (ref 1.7–7.7)
PLATELETS: 132 10*3/uL — AB (ref 150–400)
RBC: 3.87 MIL/uL — AB (ref 4.22–5.81)
RDW: 14.6 % (ref 11.5–15.5)
WBC: 4.7 10*3/uL (ref 4.0–10.5)

## 2017-08-22 LAB — ECHOCARDIOGRAM COMPLETE
HEIGHTINCHES: 68 in
WEIGHTICAEL: 2017.65 [oz_av]

## 2017-08-22 MED ORDER — ENSURE ENLIVE PO LIQD
237.0000 mL | Freq: Three times a day (TID) | ORAL | Status: DC
  Administered 2017-08-22 – 2017-08-24 (×2): 237 mL via ORAL

## 2017-08-22 NOTE — Progress Notes (Signed)
Bladder scan performed for post void residual, showing 108 cc's.

## 2017-08-22 NOTE — Progress Notes (Signed)
PROGRESS NOTE  Eric Cooley  EVO:[id number] DOB: April 01, 1920 DOA: 08/21/2017 PCP: Biagio Borg, MD  Brief Narrative:   The patient is a 83 year old male with history of moderate to severe white matter disease/dementia, BPH, coronary artery disease, gastrointestinal stromal tumor of the stomach, prostate cancer.  The patient has been taking care of predominantly by his daughter and normally recognizes her.  He mostly sits or lies in bed lately.  On the date of admission, he was extremely delirious, unable to recognize family members, hallucinating.  According to family and notes, the patient had a fall several weeks ago that resulted in an acute compression fracture.  He has been taking some hydrocodone and increased amounts of tramadol to treat his pain.  He has also not been eating or drinking very much this week.  He did not have evidence of infection such as pneumonia or UTI.  He was given IV fluids.  His sedating medications were held.  He was given multiple doses of Haldol.  Today, he is back to his baseline mentation according to his family members.  He continues to be very weak and requires assistance to transfer to bedside commode.  His daughter is worried about taking him home due to weakness and possibility that his delirium may return.  OT recommending SNF.  SW consult placed.    Assessment & Plan:   Active Problems:   Altered mental status  Dementia -Per Dr. Tamala Julian (daughter), patient on Namzaric 1 tablet daily  Altered mental status, likely due to sedating medications and mild dehydration in the setting of underlying dementia -CT head: Negative for acute abnormality. -MR brain: No acute intracranial process -TSH: 5.916, mildly elevated, but unlikely to be the cause of his altered mental status -Repeat TSH in 3-4 weeks -UDS negative -Still no evidence of pneumonia or UTI -Troponin 3 negative.  -ABG normal -Echocardiogram: Ejection fraction 55-60% with normal LV diastolic  function -  Recommended stopping tramadol and Norco and using tylenol, low dose prn NSAID, heating pad, and lidocaine patches prn.  Currently pain free  CVA/White Matter Disease -Review of patient's EMR shows visit to [facility] on 01/01/2012 Dr. Neena Rhymes. Per  note patient had previous cerebellar infarctions, moderate to severe W M.D. Visit had been preceded by episodes of difficulty walking  -See altered mental status -  OT recommending SNF -  SW consulted  Anemia of chronic disease, mild and asymptomatic  Weight loss, may be due to progressive dementia -  Defer w/u to PCP -  Appreciate nutrition assistance  Urinary frequency -Check PVR: 108 mL -Will not start any new medications at this time.  Telemetry: Appears to have MAT intermittently.  We will continue to monitor on telemetry to confirm that he is not having A. fib.  Either way, the patient is a poor candidate for anticoagulation given his frailty, cerebellar dysfunction, dementia with episodes of delirium.  Patient is a high falls risk and just had a fall a few weeks ago.  DVT prophylaxis: Heparin Code Status: Full code Family Communication: Patient and his daughter who is a physician in neuropsychiatry from [place] Disposition Plan: Probable discharge tomorrow, possibly to skilled nursing facility if insurance covers otherwise to home with home health services   Consultants:   None  Procedures:  MRI brain  Antimicrobials:  Anti-infectives (From admission, onward)   None       Subjective: Denies pain including chest pains, back pains, abdominal pains, shortness of breath, nausea, dysuria.  States he has to go to the bathroom to void more frequently than usual.  Also has been having slightly more frequent soft bowel movements.  Objective: Vitals:   08/21/17 1700 08/21/17 2125 08/22/17 0542 08/22/17 1514  BP: (!) 170/86 (!) 144/68 (!) 157/68 136/76  Pulse: 96 67 (!) 105 91  Resp: 18 19 16 16   Temp:   98.3 F (36.8 C) 98.5 F (36.9 C) 98.4 F (36.9 C)  TempSrc: Oral Oral Oral Oral  SpO2: 96% 95% 96% 93%  Weight: 57.2 kg (126 lb 1.7 oz)     Height: 5\' 8"  (1.727 m)       Intake/Output Summary (Last 24 hours) at 08/22/2017 1946 Last data filed at 08/22/2017 1807 Gross per 24 hour  Intake 60 ml  Output 200 ml  Net -140 ml   Filed Weights   08/21/17 1700  Weight: 57.2 kg (126 lb 1.7 oz)    Examination:  General exam:  Adult male, hard of hearing and with some visual impairment also.  No acute distress.  HEENT:  NCAT, MMM Respiratory system: Clear to auscultation bilaterally Cardiovascular system: IRRR, normal S1/S2. No murmurs, rubs, gallops or clicks.  Warm extremities Gastrointestinal system: Normal active bowel sounds, soft, nondistended, nontender. MSK:  Normal tone and bulk, no lower extremity edema Neuro:  Grossly moves all extremities Psych:  Oriented to hospital, but not time or situation.     Data Reviewed: I have personally reviewed following labs and imaging studies  CBC: Recent Labs  Lab 08/21/17 1313 08/22/17 0547  WBC 4.4 4.7  NEUTROABS 3.1 2.5  HGB 12.7* 12.6*  HCT 38.2* 38.6*  MCV 98.7 99.7  PLT 137* 182*   Basic Metabolic Panel: Recent Labs  Lab 08/21/17 1313 08/22/17 0547  NA 139 141  K 4.0 3.6  CL 106 109  CO2 28 27  GLUCOSE 94 91  BUN 21* 15  CREATININE 0.98 0.97  CALCIUM 9.1 8.8*  MG  --  1.9   GFR: Estimated Creatinine Clearance: 36.9 mL/min (by C-G formula based on SCr of 0.97 mg/dL). Liver Function Tests: Recent Labs  Lab 08/21/17 1313 08/22/17 0547  AST 21 30  ALT 12* 18  ALKPHOS 110 108  BILITOT 0.7 1.2  PROT 6.8 6.2*  ALBUMIN 3.7 3.4*   No results for input(s): LIPASE, AMYLASE in the last 168 hours. No results for input(s): AMMONIA in the last 168 hours. Coagulation Profile: No results for input(s): INR, PROTIME in the last 168 hours. Cardiac Enzymes: Recent Labs  Lab 08/21/17 1910 08/21/17 2346  08/22/17 0547  TROPONINI <0.03 <0.03 <0.03   BNP (last 3 results) No results for input(s): PROBNP in the last 8760 hours. HbA1C: No results for input(s): HGBA1C in the last 72 hours. CBG: Recent Labs  Lab 08/21/17 1316  GLUCAP 90   Lipid Profile: No results for input(s): CHOL, HDL, LDLCALC, TRIG, CHOLHDL, LDLDIRECT in the last 72 hours. Thyroid Function Tests: Recent Labs    08/21/17 1910  TSH 5.916*   Anemia Panel: Recent Labs    08/21/17 1910  VITAMINB12 560  FOLATE 78.0  FERRITIN 105  TIBC 297  IRON 87  RETICCTPCT 0.9   Urine analysis:    Component Value Date/Time   COLORURINE YELLOW 08/21/2017 [place] 08/21/2017 1215   LABSPEC 1.011 08/21/2017 1215   PHURINE 6.0 08/21/2017 1215   GLUCOSEU NEGATIVE 08/21/2017 1215   GLUCOSEU NEGATIVE 05/15/2017 1119   HGBUR MODERATE (A) 08/21/2017 1215   BILIRUBINUR  NEGATIVE 08/21/2017 1215   BILIRUBINUR negative 04/11/2016 1839   KETONESUR NEGATIVE 08/21/2017 1215   PROTEINUR NEGATIVE 08/21/2017 1215   UROBILINOGEN 0.2 05/15/2017 1119   NITRITE NEGATIVE 08/21/2017 1215   LEUKOCYTESUR NEGATIVE 08/21/2017 1215   Sepsis Labs: @LABRCNTIP (procalcitonin:4,lacticidven:4)  ) Recent Results (from the past 240 hour(s))  Culture, blood (routine x 2)     Status: None (Preliminary result)   Collection Time: 08/21/17  7:10 PM  Result Value Ref Range Status   Specimen Description BLOOD RIGHT ANTECUBITAL  Final   Special Requests IN PEDIATRIC BOTTLE Blood Culture adequate volume  Final   Culture   Final    NO GROWTH < 24 HOURS Performed at [facility], [place] [address] [place], [place] [address]    Report Status PENDING  Incomplete  Culture, blood (routine x 2)     Status: None (Preliminary result)   Collection Time: 08/21/17  7:10 PM  Result Value Ref Range Status   Specimen Description BLOOD LEFT ANTECUBITAL  Final   Special Requests IN PEDIATRIC BOTTLE Blood Culture adequate volume  Final   Culture    Final    NO GROWTH < 24 HOURS Performed at [facility], [place] [address], [place], [place] [address]    Report Status PENDING  Incomplete      Radiology Studies: Ct Head Wo Contrast  Result Date: 08/21/2017 CLINICAL DATA:  Altered level of consciousness. EXAM: CT HEAD WITHOUT CONTRAST TECHNIQUE: Contiguous axial images were obtained from the base of the skull through the vertex without intravenous contrast. COMPARISON:  CT 04/17/2013 FINDINGS: Brain: Progression of generalized atrophy. Progressive white matter hypodensity diffusely. New area of encephalomalacia right lateral temporal lobe compatible with chronic infarct. Negative for acute infarct, hemorrhage, or mass lesion. Vascular: Negative for hyperdense vessel Skull: Negative Sinuses/Orbits: Negative Other: None IMPRESSION: No acute abnormality Progression of atrophy and progression of chronic ischemic change since 2014. Electronically Signed   By: Franchot Gallo M.D.   On: 08/21/2017 12:04   Mr Brain Wo Contrast  Result Date: 08/21/2017 CLINICAL DATA:  Altered mental status. History of hypertension, hyperlipidemia, prostate cancer. EXAM: MRI HEAD WITHOUT CONTRAST TECHNIQUE: Coronal and axial diffusion weighted imaging, sagittal T1 sequence. Patient could not tolerate further imaging. COMPARISON:  CT HEAD August 21, 2017 and MRI of the head December 20, 2011. FINDINGS: BRAIN: No reduced diffusion to suggest acute ischemia, hyperacute ischemia or hypercellular tumor. No midline shift or mass effect. No hydrocephalus. No abnormal extra-axial fluid collections. VASCULAR: Nondiagnostic. SKULL AND UPPER CERVICAL SPINE: No abnormal sellar expansion. No suspicious calvarial bone marrow signal. Craniocervical junction maintained. SINUSES/ORBITS: Nondiagnostic. OTHER: None. IMPRESSION: 1. Limited 3 sequence noncontrast MRI head. 2. No acute intracranial process. Electronically Signed   By: Elon Alas M.D.   On: 08/21/2017 18:41   Dg  Chest Port 1 View  Result Date: 08/21/2017 CLINICAL DATA:  Altered mental status EXAM: PORTABLE CHEST 1 VIEW COMPARISON:  04/23/2016 FINDINGS: Mildly low lung volumes. Mild atelectasis at the left base. No focal consolidation. Heart size upper normal. Aortic atherosclerosis. No pneumothorax. Old deformity distal left clavicle. IMPRESSION: Low lung volumes with minimal atelectasis at the left base. Electronically Signed   By: Donavan Foil M.D.   On: 08/21/2017 15:27     Scheduled Meds: . aspirin EC  81 mg Oral Daily  . feeding supplement (ENSURE ENLIVE)  237 mL Oral TID BM  . heparin  5,000 Units Subcutaneous Q8H   Continuous Infusions:   LOS: 1  day    Time spent: 30 min    Janece Canterbury, MD [facility] Pager [phone]  If 7PM-7AM, please contact night-coverage [email or web address] Password [facility] 08/22/2017, 7:46 PM

## 2017-08-22 NOTE — Care Management Note (Signed)
Case Management Note  Patient Details  Name: Eric Cooley MRN: [id number] Date of Birth: 1920-09-06  Subjective/Objective:                  ams  Action/Plan: Date: August 22, 2017 Velva Harman, BSN, [place], [place]  [phone] Chart and notes review for patient progress and needs. Will follow for case management and discharge needs. Next review date: [id number]  Expected Discharge Date:  (unknown)               Expected Discharge Plan:  Home/Self Care  In-House Referral:     Discharge planning Services  CM Consult  Post Acute Care Choice:    Choice offered to:     DME Arranged:    DME Agency:     HH Arranged:    HH Agency:     Status of Service:  In process, will continue to follow  If discussed at Long Length of Stay Meetings, dates discussed:    Additional Comments:  Leeroy Cha, RN 08/22/2017, 8:17 AM

## 2017-08-22 NOTE — Evaluation (Signed)
Occupational Therapy Evaluation Patient Details Name: Eric Cooley MRN: [id number] DOB: May 17, 1920 Today's Date: 08/22/2017    History of Present Illness pt was admitted for AMS.  H/o CVA, prostate CA, MI and low back pain   Clinical Impression   This 83 year old man was admitted for AMS. He recently moved in with daughter and needs assistance for bathing but gets to the bathroom on his own. Will follow in acute with these goals.  He currently needs min A and goals are for min guard.    Follow Up Recommendations  SNF    Equipment Recommendations  None recommended by OT    Recommendations for Other Services       Precautions / Restrictions Precautions Precautions: Fall Precaution Comments: poor vision Restrictions Weight Bearing Restrictions: No      Mobility Bed Mobility               General bed mobility comments: supervision for eob  Transfers Overall transfer level: Needs assistance Equipment used: Rolling walker (2 wheeled) Transfers: Sit to/from Stand Sit to Stand: Min assist         General transfer comment: assist to rise and steady    Balance                                           ADL either performed or assessed with clinical judgement   ADL Overall ADL's : Needs assistance/impaired Eating/Feeding: Set up;Sitting   Grooming: Minimal assistance;Oral care;Standing(also brushed hair with set up)               Lower Body Dressing: Moderate assistance;Sit to/from stand   Toilet Transfer: Minimal assistance;Ambulation;BSC;RW   Toileting- Clothing Manipulation and Hygiene: Minimal assistance;Sit to/from stand         General ADL Comments: ambulated to bathroom, used toilet and stood at sink for grooming. Pt's daughter asissts with bathing at baseline. Pt was mod I gettting to bathroom.  Poor vision for turning RW around obstacles and for applying toothpaste. 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:  (cataract; poor vision)       Perception     Praxis      Pertinent Vitals/Pain Pain Assessment: No/denies pain(daughter reports recent compression fx)     Hand Dominance     Extremity/Trunk Assessment Upper Extremity Assessment Upper Extremity Assessment: Generalized weakness           Communication Communication Communication: No difficulties   Cognition Arousal/Alertness: Awake/alert Behavior During Therapy: WFL for tasks assessed/performed Overall Cognitive Status: Impaired/Different from baseline Area of Impairment: Safety/judgement                               General Comments: pt followed all basic commands but needed multimodal cues with RW for safety   General Comments       Exercises     Shoulder Instructions      Home Living Family/patient expects to be discharged to:: Unsure Living Arrangements: Children                               Additional Comments: Pt recently moved in with daughter (1 month ago).  They would like for him to go for rehab prior to home  Prior Functioning/Environment Level of Independence: Needs assistance        Comments: pt furniture walked to bathroom alone.  Daughter had to assist with bathing:  pt would not get into shower        OT Problem List: Decreased strength;Decreased activity tolerance;Impaired balance (sitting and/or standing);Impaired vision/perception;Decreased safety awareness      OT Treatment/Interventions: Self-care/ADL training;DME and/or AE instruction;Patient/family education;Balance training    OT Goals(Current goals can be found in the care plan section) Acute Rehab OT Goals Patient Stated Goal: rehab (family) OT Goal Formulation: With patient/family Time For Goal Achievement: 08/29/17 Potential to Achieve Goals: Good ADL Goals Pt Will Perform Grooming: with min guard assist;standing Pt Will Transfer to Toilet: with min guard assist;ambulating;bedside  commode Pt Will Perform Toileting - Clothing Manipulation and hygiene: with min guard assist;sit to/from stand  OT Frequency: Min 2X/week   Barriers to D/C:            Co-evaluation              AM-PAC PT "6 Clicks" Daily Activity     Outcome Measure Help from another person eating meals?: A Little Help from another person taking care of personal grooming?: A Little Help from another person toileting, which includes using toliet, bedpan, or urinal?: A Little Help from another person bathing (including washing, rinsing, drying)?: A Lot Help from another person to put on and taking off regular upper body clothing?: A Lot Help from another person to put on and taking off regular lower body clothing?: A Lot 6 Click Score: 15   End of Session    Activity Tolerance: Patient tolerated treatment well Patient left: in bed;with call bell/phone within reach;with nursing/sitter in room;with family/visitor present;with bed alarm set  OT Visit Diagnosis: Unsteadiness on feet (R26.81);Muscle weakness (generalized) (M62.81)                Time: 5176-1607 OT Time Calculation (min): 23 min Charges:  OT General Charges $OT Visit: 1 Visit OT Evaluation $OT Eval Moderate Complexity: 1 Mod G-Codes:     [place], OTR/L [phone] 08/22/2017  Eric Cooley 08/22/2017, 4:01 PM

## 2017-08-22 NOTE — Progress Notes (Signed)
  Echocardiogram 2D Echocardiogram has been performed.  Eric Cooley M 08/22/2017, 11:07 AM

## 2017-08-22 NOTE — Progress Notes (Signed)
Initial Nutrition Assessment  DOCUMENTATION CODES:   Not applicable  INTERVENTION:   Ensure Enlive po TID, each supplement provides 350 kcal and 20 grams of protein  NUTRITION DIAGNOSIS:   Increased nutrient needs related to cancer and cancer related treatments as evidenced by estimated needs.  GOAL:   Patient will meet greater than or equal to 90% of their needs  MONITOR:   PO intake, Supplement acceptance, Weight trends, Labs  REASON FOR ASSESSMENT:   Consult Assessment of nutrition requirement/status  ASSESSMENT:   Pt with PMH significant for CVA, moderate to severe white matter disease, BPH, CAD, HTN, HLD, GI stromal tumor of stomach, and prostate cancer. Presents this admission for evaluation of confusion and altered mental status.    Spoke with pt at bedside who seemed confused. No family present to provide further nutrition history. States he typically consumes 3 meals per day and started to experience decrease in appetite for the last three days. Could not elaborate any further on dietary recall.   Denies any supplementation use at home. Amendable to Ensure this admission.  Reports a UBW of 143 lb, the last time being at that weight unknown.  Records indicate pt weighed 136 lb in May 2018 and 126 lb this stay. This shows a 7% wt loss in 6 months, insignificant for time frame.  Nutrition-Focused physical exam completed. Suspect some depletions are from advanced age.  Suspect some form of malnutrition but unable to diagnose at this time given lack of evidence.   Medications reviewed. Labs reviewed.   NUTRITION - FOCUSED PHYSICAL EXAM:    Most Recent Value  Orbital Region  Moderate depletion  Upper Arm Region  Moderate depletion  Thoracic and Lumbar Region  Unable to assess  Buccal Region  Moderate depletion  Temple Region  Moderate depletion  Clavicle Bone Region  Moderate depletion  Clavicle and Acromion Bone Region  Moderate depletion  Scapular Bone Region   Unable to assess  Dorsal Hand  Moderate depletion  Patellar Region  Moderate depletion  Anterior Thigh Region  Moderate depletion  Posterior Calf Region  Moderate depletion  Edema (RD Assessment)  None  Hair  Reviewed  Eyes  Reviewed  Mouth  Reviewed  Skin  Reviewed  Nails  Reviewed       Diet Order:  Diet regular Room service appropriate? Yes; Fluid consistency: Thin  EDUCATION NEEDS:   Not appropriate for education at this time  Skin:  Skin Assessment: Reviewed RN Assessment  Last BM:  PTA  Height:   Ht Readings from Last 1 Encounters:  08/21/17 5\' 8"  (1.727 m)    Weight:   Wt Readings from Last 1 Encounters:  08/21/17 126 lb 1.7 oz (57.2 kg)    Ideal Body Weight:  70 kg  BMI:  Body mass index is 19.17 kg/m.  Estimated Nutritional Needs:   Kcal:  2100-2300 kcal/day  Protein:  105-115 g/day  Fluid:  >2.1 L/day    Mariana Single RD, LDN Clinical Nutrition Pager # - [phone]

## 2017-08-23 DIAGNOSIS — R410 Disorientation, unspecified: Secondary | ICD-10-CM | POA: Diagnosis not present

## 2017-08-23 DIAGNOSIS — R4182 Altered mental status, unspecified: Secondary | ICD-10-CM | POA: Diagnosis not present

## 2017-08-23 DIAGNOSIS — M545 Low back pain: Secondary | ICD-10-CM | POA: Diagnosis not present

## 2017-08-23 DIAGNOSIS — R2681 Unsteadiness on feet: Secondary | ICD-10-CM

## 2017-08-23 DIAGNOSIS — F05 Delirium due to known physiological condition: Secondary | ICD-10-CM | POA: Diagnosis not present

## 2017-08-23 DIAGNOSIS — R531 Weakness: Secondary | ICD-10-CM | POA: Diagnosis not present

## 2017-08-23 DIAGNOSIS — G8929 Other chronic pain: Secondary | ICD-10-CM

## 2017-08-23 LAB — URINE CULTURE

## 2017-08-23 MED ORDER — ENSURE ENLIVE PO LIQD: 237.0000 mL | Freq: Three times a day (TID) | ORAL | 0 refills | Status: AC

## 2017-08-23 NOTE — Discharge Summary (Addendum)
Physician Discharge Summary  ROCZEN WAYMIRE OVZ:[id number] DOB: 1919/12/12 DOA: 08/21/2017  PCP: Biagio Borg, MD  Admit date: 08/21/2017 Discharge date: 08/24/2017  Admitted From: home with daughter Disposition:  SNF  Recommendations for Outpatient Follow-up:  1. Stopped all medications  2. Repeat TSH in 3-4 weeks 3. Ophthalmology in 1 month 4. Recommend hearing screen   Home Health:  Ongoing PT/OT at SNF Equipment/Devices:  Uses rolling walker  Discharge Condition:  Stable, improved CODE STATUS:  Full code  Diet recommendation:  Regular diet   Brief/Interim Summary:  The patient is a 83 year old male with history of moderate to severe white matter disease/dementia, BPH, coronary artery disease, gastrointestinal stromal tumor of the stomach, prostate cancer.  The patient has been taken care of predominantly by his daughter and normally recognizes her.  He mostly sits or lies in bed lately, although two weeks ago, he went to go vote and walked around [place] for a long time.  On the date of admission, he was extremely delirious, unable to recognize family members, hallucinating.  According to family and notes, the patient fell several weeks ago and developed an acute compression fracture.  He had been taking some hydrocodone and tramadol to treat his pain.  He has also not been eating or drinking very much this week.  He did not have evidence of infection such as pneumonia or UTI or cold symptoms to suggest URI.  He was given IV fluids.  His sedating medications were held.  In the ER, he initially required haldol, but after 12-24 hours, he was back to his baseline mentation and did not require further doses.  Overnight, he was calm and he remains at his baseline mentation today according to his family members.  He continues to be very weak and unsteady and PT/OT recommended SNF.    Discharge Diagnoses:  Principal Problem:   Delirium secondary to multiple medical problems Active  Problems:   Unsteady gait   Chronic back pain   Generalized weakness   Delirium, likely due to sedating medications and mild dehydration in the setting of underlying dementia, resolved with IVF and holding medications.  Patient back to baseline.   -CT head: Negative for acute abnormality. -MR brain: No acute intracranial process -TSH: 5.916, mildly elevated, but unlikely to be the cause of his altered mental status -Repeat TSH in 3-4 weeks -UDS negative -Still no evidence of pneumonia or UTI -Troponin 3 negative.  -ABG normal -Echocardiogram: Ejection fraction 55-60% with normal LV diastolic function -  Recommended stopping tramadol and Norco and using tylenol, low dose prn NSAID, heating pad, and lidocaine patches prn.  Currently pain free  Generalized weakness, visit had been preceded by episodes of difficulty walking  -  PT/OT recommending SNF -  SW assisting with SNF admission.  Patient had sitter until morning of 11/17 and will be ready for discharge to SNF on morning of 11/18.    CVA/WhiteMatterDisease/Dementia -  Namzaric1 tablet daily has been stopped for now   -[facility]. Edison Nasuti.Per note patient had previous cerebellar infarctions, moderate to severe W M.D.   Compression fracture with chronic back pain -  Pain free during stay despite stopping his pain medications -  Would recommend tylenol, heating pad, lidocaine patches for pain management if pain recurs  Anemia of chronic disease, mild and asymptomatic  Weight loss, may be due to progressive dementia -  Defer w/u to PCP -  Appreciate nutrition assistance  Urinary frequency -Check PVR: 108 mL -Will not start any new medications at this time.  PACs.  Appears to have MAT intermittently.  I did not see evidence of A. Fib even though the monitor was interpreting the rhythm as a-fib.  Either way, the patient is a poor candidate for  anticoagulation given his frailty, cerebellar dysfunction, dementia with episodes of delirium.  Patient is a high falls risk and just had a fall a few weeks ago.  Situation discussed with daughter who was in agreement with no further monitoring or treatment.  Transient double vision.  Possible he had a TIA.  Family would not be interested in CEA so carotid duplex was deferred.  No PFO on ECHO and again, family not interested in surgery anyway.  We discussed utility of aspirin or statin, but again, given his frailty, we decided against starting any medications to reduce long-term risk.  Recommended that he see his ophthalmologist to discuss any minimally invasive procedures that could improve his sight.     Discharge Instructions     Medication List    STOP taking these medications   docusate sodium 100 MG capsule Commonly known as:  COLACE   fexofenadine 180 MG tablet Commonly known as:  ALLEGRA   HYDROcodone-acetaminophen 5-325 MG tablet Commonly known as:  NORCO/VICODIN   Memantine HCl-Donepezil HCl 28-10 MG Cp24 Commonly known as:  NAMZARIC   traMADol 50 MG tablet Commonly known as:  ULTRAM     TAKE these medications   feeding supplement (ENSURE ENLIVE) Liqd Take 237 mLs 3 (three) times daily between meals by mouth.      Follow-up Information    Biagio Borg, MD. Schedule an appointment as soon as possible for a visit in 1 month(s).   Specialties:  Internal Medicine, Radiology Contact information: [place] [address] [phone]          No Known Allergies  Consultations: none   Procedures/Studies: Dg Lumbar Spine 2-3 Views  Result Date: 07/28/2017 CLINICAL DATA:  Fall 10 days ago, low back and bilateral hip pain since. EXAM: LUMBAR SPINE - 2-3 VIEW COMPARISON:  Plain film of the lumbar spine dated 05/15/2017. FINDINGS: Stable mild dextroscoliosis.  Alignment appears stable. New compression fracture deformity involving the superior  endplate of the L4 vertebral body, approximately 25% compression anteriorly, without associated retropulsion. Chronic compression fracture deformity of the L1 vertebral body, not significantly changed in height compared to the recent exam of 05/15/2017. Questionable mild compression deformities of the L2 and L3 vertebral bodies appear stable/chronic. Upper sacrum appears intact and normally aligned. Atherosclerotic changes noted along the walls of the infrarenal abdominal aorta. Paravertebral soft tissues are otherwise unremarkable. IMPRESSION: 1. New compression fracture deformity involving the superior endplate of the L4 vertebral body, approximately 25% compression anteriorly. No associated vertebral body displacement or retropulsion seen. 2. Chronic compression fracture deformity of the L1 vertebral body, not significantly changed compared to the recent exam of 05/15/2017. 3. Stable scoliosis. 4. Aortic atherosclerosis. These results will be called to the ordering clinician or representative by the Radiologist Assistant, and communication documented in the PACS or zVision Dashboard. Electronically Signed   By: Franki Cabot M.D.   On: 07/28/2017 16:26   Ct Head Wo Contrast  Result Date: 08/21/2017 CLINICAL DATA:  Altered level of consciousness. EXAM: CT HEAD WITHOUT CONTRAST TECHNIQUE: Contiguous axial images were obtained from the base of the skull through the vertex without intravenous contrast. COMPARISON:  CT 04/17/2013 FINDINGS:  Brain: Progression of generalized atrophy. Progressive white matter hypodensity diffusely. New area of encephalomalacia right lateral temporal lobe compatible with chronic infarct. Negative for acute infarct, hemorrhage, or mass lesion. Vascular: Negative for hyperdense vessel Skull: Negative Sinuses/Orbits: Negative Other: None IMPRESSION: No acute abnormality Progression of atrophy and progression of chronic ischemic change since 2014. Electronically Signed   By: Franchot Gallo M.D.   On: 08/21/2017 12:04   Mr Brain Wo Contrast  Result Date: 08/21/2017 CLINICAL DATA:  Altered mental status. History of hypertension, hyperlipidemia, prostate cancer. EXAM: MRI HEAD WITHOUT CONTRAST TECHNIQUE: Coronal and axial diffusion weighted imaging, sagittal T1 sequence. Patient could not tolerate further imaging. COMPARISON:  CT HEAD August 21, 2017 and MRI of the head December 20, 2011. FINDINGS: BRAIN: No reduced diffusion to suggest acute ischemia, hyperacute ischemia or hypercellular tumor. No midline shift or mass effect. No hydrocephalus. No abnormal extra-axial fluid collections. VASCULAR: Nondiagnostic. SKULL AND UPPER CERVICAL SPINE: No abnormal sellar expansion. No suspicious calvarial bone marrow signal. Craniocervical junction maintained. SINUSES/ORBITS: Nondiagnostic. OTHER: None. IMPRESSION: 1. Limited 3 sequence noncontrast MRI head. 2. No acute intracranial process. Electronically Signed   By: Elon Alas M.D.   On: 08/21/2017 18:41   Dg Chest Port 1 View  Result Date: 08/21/2017 CLINICAL DATA:  Altered mental status EXAM: PORTABLE CHEST 1 VIEW COMPARISON:  04/23/2016 FINDINGS: Mildly low lung volumes. Mild atelectasis at the left base. No focal consolidation. Heart size upper normal. Aortic atherosclerosis. No pneumothorax. Old deformity distal left clavicle. IMPRESSION: Low lung volumes with minimal atelectasis at the left base. Electronically Signed   By: Donavan Foil M.D.   On: 08/21/2017 15:27     Subjective: Denies pain, SOB, nausea.  Still having some increased urination.  Not constipated and not having diarrhea.  Thinking is near his baseline according to daughter, but still very unsteady/weak.    Discharge Exam: Vitals:   08/23/17 0410 08/23/17 1332  BP: (!) 163/84 110/70  Pulse: 72 82  Resp: 16 16  Temp: 98 F (36.7 C) 98.6 F (37 C)  SpO2: 94% 95%   Vitals:   08/22/17 2326 08/23/17 0241 08/23/17 0410 08/23/17 1332  BP: 140/69  (!)  163/84 110/70  Pulse: 88  72 82  Resp: 15  16 16   Temp: 98 F (36.7 C)  98 F (36.7 C) 98.6 F (37 C)  TempSrc: Oral  Oral Oral  SpO2: 95%  94% 95%  Weight:  57 kg (125 lb 10.6 oz)    Height:        General: Pt is alert, awake, not in acute distress Cardiovascular: RRR, S1/S2 +, no rubs, no gallops Respiratory: CTA bilaterally, no wheezing, no rhonchi Abdominal: Soft, NT, ND, bowel sounds + Extremities: no edema, no cyanosis Neuro:  Grossly moves all extremities, no facial droop Psych:  Alert and oriented to person, but not place or time    The results of significant diagnostics from this hospitalization (including imaging, microbiology, ancillary and laboratory) are listed below for reference.     Microbiology: Recent Results (from the past 240 hour(s))  Urine culture     Status: Abnormal   Collection Time: 08/21/17 12:15 PM  Result Value Ref Range Status   Specimen Description URINE, CLEAN CATCH  Final   Special Requests NONE  Final   Culture MULTIPLE SPECIES PRESENT, SUGGEST RECOLLECTION (A)  Final   Report Status 08/23/2017 FINAL  Final  Culture, blood (routine x 2)     Status: None (Preliminary result)  Collection Time: 08/21/17  7:10 PM  Result Value Ref Range Status   Specimen Description BLOOD RIGHT ANTECUBITAL  Final   Special Requests IN PEDIATRIC BOTTLE Blood Culture adequate volume  Final   Culture   Final    NO GROWTH < 24 HOURS Performed at [facility], [place] [address] [place], [place] [address]    Report Status PENDING  Incomplete  Culture, blood (routine x 2)     Status: None (Preliminary result)   Collection Time: 08/21/17  7:10 PM  Result Value Ref Range Status   Specimen Description BLOOD LEFT ANTECUBITAL  Final   Special Requests IN PEDIATRIC BOTTLE Blood Culture adequate volume  Final   Culture   Final    NO GROWTH < 24 HOURS Performed at [facility], [place] [address], [place], [place] [address]    Report Status PENDING   Incomplete     Labs: BNP (last 3 results) No results for input(s): BNP in the last 8760 hours. Basic Metabolic Panel: Recent Labs  Lab 08/21/17 1313 08/22/17 0547  NA 139 141  K 4.0 3.6  CL 106 109  CO2 28 27  GLUCOSE 94 91  BUN 21* 15  CREATININE 0.98 0.97  CALCIUM 9.1 8.8*  MG  --  1.9   Liver Function Tests: Recent Labs  Lab 08/21/17 1313 08/22/17 0547  AST 21 30  ALT 12* 18  ALKPHOS 110 108  BILITOT 0.7 1.2  PROT 6.8 6.2*  ALBUMIN 3.7 3.4*   No results for input(s): LIPASE, AMYLASE in the last 168 hours. No results for input(s): AMMONIA in the last 168 hours. CBC: Recent Labs  Lab 08/21/17 1313 08/22/17 0547  WBC 4.4 4.7  NEUTROABS 3.1 2.5  HGB 12.7* 12.6*  HCT 38.2* 38.6*  MCV 98.7 99.7  PLT 137* 132*   Cardiac Enzymes: Recent Labs  Lab 08/21/17 1910 08/21/17 2346 08/22/17 0547  TROPONINI <0.03 <0.03 <0.03   BNP: Invalid input(s): POCBNP CBG: Recent Labs  Lab 08/21/17 1316  GLUCAP 90   D-Dimer No results for input(s): DDIMER in the last 72 hours. Hgb A1c No results for input(s): HGBA1C in the last 72 hours. Lipid Profile No results for input(s): CHOL, HDL, LDLCALC, TRIG, CHOLHDL, LDLDIRECT in the last 72 hours. Thyroid function studies Recent Labs    08/21/17 1910  TSH 5.916*   Anemia work up Recent Labs    08/21/17 1910  VITAMINB12 560  FOLATE 78.0  FERRITIN 105  TIBC 297  IRON 87  RETICCTPCT 0.9   Urinalysis    Component Value Date/Time   COLORURINE YELLOW 08/21/2017 [place] 08/21/2017 1215   LABSPEC 1.011 08/21/2017 1215   PHURINE 6.0 08/21/2017 1215   GLUCOSEU NEGATIVE 08/21/2017 1215   GLUCOSEU NEGATIVE 05/15/2017 1119   HGBUR MODERATE (A) 08/21/2017 1215   BILIRUBINUR NEGATIVE 08/21/2017 1215   BILIRUBINUR negative 04/11/2016 [place] 08/21/2017 1215   PROTEINUR NEGATIVE 08/21/2017 1215   UROBILINOGEN 0.2 05/15/2017 1119   NITRITE NEGATIVE 08/21/2017 1215   LEUKOCYTESUR  NEGATIVE 08/21/2017 1215   Sepsis Labs Invalid input(s): PROCALCITONIN,  WBC,  LACTICIDVEN   Time coordinating discharge: Over 30 minutes  SIGNED:   Janece Canterbury, MD  [facility] 08/23/2017, 2:11 PM Pager   If 7PM-7AM, please contact night-coverage [email or web address] Password [email or web address]

## 2017-08-23 NOTE — Care Management Note (Addendum)
Case Management Note  Patient Details  Name: Eric Cooley MRN: [id number] Date of Birth: March 15, 1920  Subjective/Objective:   Altered Mental Status                 Action/Plan: Discharge Planning: NCM spoke to pt and dtr's at bedside. Pt lives in the home with his dtr, Gregary Signs. He has RW, Conservation officer, nature with seat, shower chair and bedside commode. Dtr are requesting SNF for rehab. CSW referral for SNF for scheduled dc today. Family requesting [facility].   08/24/2017 1211 Scheduled dc today to SNF, pt with safety sitter on 08/23/2017. CSW working with family for dc today to SNF rehab.   PCP Biagio Borg   Expected Discharge Date:   08/24/2017            Expected Discharge Plan:  Skilled Nursing Facility  In-House Referral:  Clinical Social Work  Discharge planning Services  CM Consult  Post Acute Care Choice:  NA Choice offered to:  NA  DME Arranged:  N/A DME Agency:  NA  HH Arranged:  NA HH Agency:  NA  Status of Service:  Completed, signed off  If discussed at [employer] of Stay Meetings, dates discussed:    Additional Comments:  Erenest Rasher, RN 08/23/2017, 12:23 PM

## 2017-08-23 NOTE — Care Management Obs Status (Signed)
[facility] NOTIFICATION   Patient Details  Name: Eric Cooley MRN: [id number] Date of Birth: [birth year]   Medicare Observation Status Notification Given:  Yes    Erenest Rasher, RN 08/23/2017, 12:10 PM

## 2017-08-23 NOTE — NC FL2 (Signed)
[place] LEVEL OF CARE SCREENING TOOL     IDENTIFICATION  Patient Name: Eric Cooley Birthdate: 08-16-1920 Sex: male Admission Date (Current Location): 08/21/2017  [facility] and [place] Number:  Herbalist and Address:  [facility],  [place] [address] [place]      Provider Number: [id number]  Attending Physician Name and Address:  Janece Canterbury, MD  Relative Name and Phone Number:       Current Level of Care: Hospital Recommended Level of Care: [place] Prior Approval Number:    Date Approved/Denied:   PASRR Number:   [id number]   Discharge Plan: SNF    Current Diagnoses: Patient Active Problem List   Diagnosis Date Noted  . Delirium secondary to multiple medical problems 08/23/2017  . Generalized weakness 08/23/2017  . Altered mental status 08/21/2017  . Occipital scalp laceration 07/28/2017  . Allergic conjunctivitis 06/25/2017  . Low back pain 05/15/2017  . Bilateral leg pain 12/05/2016  . Right shoulder pain 12/05/2016  . CAP (community acquired pneumonia) 04/11/2016  . 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 Right flank pain, chronic 06/08/2015  . 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 Thoracic scoliosis 03/30/2015  . Facet syndrome, lumbar ([place]) 03/30/2015  . Myofascial pain on right side 03/30/2015  . Mild dementia 03/14/2015  . Abnormal LFTs   . Chest pain 03/10/2015  . Left shoulder pain 02/01/2015  . Pleuritic chest pain 02/01/2015  . Abnormal CXR 12/16/2014  . Chronic back pain 12/16/2014  . Unsteady gait 12/02/2014  . Memory loss 07/19/2013  . Loss of weight 07/16/2013  . Chest pain with low risk for cardiac etiology 07/12/2013  . Protein-calorie malnutrition, severe ([place]) 07/12/2013  . Subacute confusional state 05/28/2013  . Gait disorder 05/28/2013  . Subarachnoid hemorrhage following injury ([place]) 04/16/2013  . Shoulder  dislocation-right 04/16/2013  . Intermittent low back pain 04/15/2012  . Constipation 03/04/2012  . Dysuria 03/04/2012  . Pneumonia 01/29/2012  . Dizziness 12/18/2011  . Balance problems 12/18/2011  . Pre-ulcerative corn or callous 10/13/2011  . DJD (degenerative joint disease), lumbar 04/01/2011  . CAD (coronary artery disease) 03/29/2011  . Prostate cancer ([place]) 03/29/2011  . 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 HTN (hypertension) 03/29/2011  . Hyperlipidemia 03/29/2011  . BPH (benign prostatic hypertrophy) 03/29/2011  . GERD (gastroesophageal reflux disease) 03/29/2011  . Encounter for well adult exam with abnormal findings 03/29/2011  . S/P cholecystectomy 03/29/2011  . Allergic rhinitis 03/29/2011  . S/P partial gastrectomy 03/29/2011  . Anemia 03/29/2011    Orientation RESPIRATION BLADDER Height & Weight     Self  Normal Incontinent Weight: 125 lb 10.6 oz (57 kg) Height:  5\' 8"  (172.7 cm)  BEHAVIORAL SYMPTOMS/MOOD NEUROLOGICAL BOWEL NUTRITION STATUS        Diet(regular)  AMBULATORY STATUS COMMUNICATION OF NEEDS Skin   Limited Assist Verbally Normal                       Personal Care Assistance Level of Assistance  Bathing, Feeding, Dressing Bathing Assistance: Limited assistance Feeding assistance: Independent Dressing Assistance: Limited assistance     Functional Limitations Info             SPECIAL CARE FACTORS FREQUENCY  PT (By licensed PT), OT (By licensed OT)     PT Frequency: 5 OT Frequency: 5            Contractures  Additional Factors Info  Code Status, Allergies Code Status Info: full code  Allergies Info: NKA            Current Medications (08/23/2017):  This is the current hospital active medication list Current Facility-Administered Medications  Medication Dose Route Frequency Provider Last Rate Last Dose  . feeding supplement (ENSURE ENLIVE) (ENSURE ENLIVE) liquid 237 mL  237 mL Oral TID BM Janece Canterbury,  MD   237 mL at 08/22/17 2050  . heparin injection 5,000 Units  5,000 Units Subcutaneous Q8H Allie Bossier, MD   5,000 Units at 08/23/17 1638     Discharge Medications: Please see discharge summary for a list of discharge medications.  Relevant Imaging Results:  Relevant Lab Results:   Additional Information SS#: [id number]  Weston Anna, LCSW

## 2017-08-23 NOTE — Progress Notes (Signed)
CSW spoke with patients daughter, Eric Cooley [phone], regarding discharge plans/ potential barriers. Patient had safety sitter early 11/17 AM and will need 24 hours sitter free to DC to SNF. CSW explained that bed offers may be limited due to weekend discharge- however family is open to options at this time. Family is aware that patient will be ready for discharge tomorrow 11/18. CSW will complete FL2/ PASRR to fax information to [facility] skilled nursing facilities. Once bed offers are available- CSW will speak with family again.   CSW will continue to follow for discharge plans.   Kingsley Spittle, [facility] Emergency Room Clinical Social Worker [phone]

## 2017-08-23 NOTE — Progress Notes (Signed)
CSW received consult for skilled nursing placement and notes potential discharge for today. Patient does not have any PT notes listed in chart that recommends SNF placement at this time. If ready for discharge today, patient will need to discharge home with [facility] services.   Kingsley Spittle, [facility] Emergency Room Clinical Social Worker [phone]

## 2017-08-23 NOTE — Progress Notes (Signed)
Pt has remained calm. Sleeping in his bed without attempts to climb out of bed. Responds appropriately to questions, has been able to let the safety sitter know when he had to use he urinal

## 2017-08-23 NOTE — Evaluation (Signed)
Physical Therapy Evaluation Patient Details Name: Eric Cooley MRN: [id number] DOB: May 09, 1920 Today's Date: 08/23/2017   History of Present Illness  pt was admitted for AMS.  H/o CVA, prostate CA, MI and low back pain, white matter dz; CT and MRI head negative  Clinical Impression  Pt admitted with above diagnosis. Pt currently with functional limitations due to the deficits listed below (see PT Problem List). * Pt will benefit from skilled PT to increase their independence and safety with mobility to allow discharge to the venue listed below.    Pt with marked change in mobility and cognitive status compared to pt baseline; pt is cooperative with PT although continues to require multi-modal cues  And assist for all functional tasks; pt is at risk for falls and has had 2 ED visits, 1 admission in the last 6 mos; given these factors, recommend SNF level therapies post  cute stay; will continue to follow in acute setting;     Follow Up Recommendations SNF;Supervision/Assistance - 24 hour    Equipment Recommendations  None recommended by PT    Recommendations for Other Services       Precautions / Restrictions Precautions Precautions: Fall Precaution Comments: poor vision      Mobility  Bed Mobility Overal bed mobility: Needs Assistance Bed Mobility: Supine to Sit     Supine to sit: Min assist     General bed mobility comments: incr time, mult-modal cues to stay on task and complete; assist with LEs  Transfers Overall transfer level: Needs assistance Equipment used: Rolling walker (2 wheeled) Transfers: Sit to/from Stand Sit to Stand: Min assist         General transfer comment: assist to rise and steady, cues for safety and hand placement  Ambulation/Gait Ambulation/Gait assistance: Min assist Ambulation Distance (Feet): 35 Feet(12') Assistive device: Rolling walker (2 wheeled) Gait Pattern/deviations: Step-through pattern;Decreased stride length;Shuffle;Trunk  flexed;Narrow base of support Gait velocity: decr   General Gait Details: multi-modal cues for trunk extension, hip extension, upward gaze and RW safety/distance from self;   Stairs            Wheelchair Mobility    Modified Rankin (Stroke Patients Only)       Balance Overall balance assessment: Needs assistance;History of Falls(most recent fall in [place] resulting in head laceration) Sitting-balance support: Feet supported;Single extremity supported Sitting balance-Leahy Scale: Fair   Postural control: Posterior lean Standing balance support: During functional activity;No upper extremity supported Standing balance-Leahy Scale: Fair Standing balance comment: close supervision to min/guard for safe balance while washing hands at sink; grossly reliant on UE support during incr wt shifting;              High level balance activites: Side stepping;Turns;Backward walking High Level Balance Comments: requires min assist to prevent falls with above             Pertinent Vitals/Pain Pain Assessment: No/denies pain(recent compression fx)    Home Living Family/patient expects to be discharged to:: Skilled nursing facility                 Additional Comments: Pt recently moved in with daughter (1 month ago).  They would like for him to go for rehab prior to home    Prior Function Level of Independence: Needs assistance   Gait / Transfers Assistance Needed: amb with RW      Comments: pt furniture walked to bathroom alone.  Daughter had to assist with bathing:  pt would not get  into shower     Hand Dominance        Extremity/Trunk Assessment   Upper Extremity Assessment Upper Extremity Assessment: Generalized weakness    Lower Extremity Assessment Lower Extremity Assessment: Generalized weakness    Cervical / Trunk Assessment Cervical / Trunk Assessment: Other exceptions;Kyphotic Cervical / Trunk Exceptions: flexed trunk and significant fwd head  posture,  dtrs report pt is much more flexed than at his baseline  Communication      Cognition Arousal/Alertness: Awake/alert Behavior During Therapy: WFL for tasks assessed/performed;Flat affect Overall Cognitive Status: Impaired/Different from baseline Area of Impairment: Orientation;Attention;Following commands                 Orientation Level: Disoriented to;Time;Situation Current Attention Level: Sustained   Following Commands: Follows one step commands with increased time;Follows multi-step commands inconsistently       General Comments: pt unable to state location but nods in agreement to [place] when given mutiple choice; says year is "18"; pt does not know whey is in the hospital but does recall his dats telling him he had "lost it"; pt seemed to mentally clear as PT session progressed, more alert and less fidgeting      General Comments      Exercises     Assessment/Plan    PT Assessment Patient needs continued PT services  PT Problem List Decreased strength;Decreased activity tolerance;Decreased balance;Decreased knowledge of use of DME;Decreased cognition;Decreased mobility;Decreased safety awareness       PT Treatment Interventions DME instruction;Gait training;Functional mobility training;Therapeutic activities;Therapeutic exercise;Patient/family education;Balance training    PT Goals (Current goals can be found in the Care Plan section)  Acute Rehab PT Goals Patient Stated Goal: rehab (family) PT Goal Formulation: With family Time For Goal Achievement: 09/05/17 Potential to Achieve Goals: Good    Frequency Min 3X/week   Barriers to discharge        Co-evaluation               AM-PAC PT "6 Clicks" Daily Activity  Outcome Measure Difficulty turning over in bed (including adjusting bedclothes, sheets and blankets)?: Unable Difficulty moving from lying on back to sitting on the side of the bed? : Unable Difficulty sitting down on and  standing up from a chair with arms (e.g., wheelchair, bedside commode, etc,.)?: Unable Help needed moving to and from a bed to chair (including a wheelchair)?: A Little Help needed walking in hospital room?: A Little Help needed climbing 3-5 steps with a railing? : A Lot 6 Click Score: 11    End of Session Equipment Utilized During Treatment: Gait belt Activity Tolerance: Patient tolerated treatment well Patient left: in chair;with call bell/phone within reach;with chair alarm set;with family/visitor present(no alarm box, NT aware) Nurse Communication: Mobility status PT Visit Diagnosis: Unsteadiness on feet (R26.81);Muscle weakness (generalized) (M62.81);Repeated falls (R29.6);Other abnormalities of gait and mobility (R26.89)    Time: 4270-6237 PT Time Calculation (min) (ACUTE ONLY): 28 min   Charges:   PT Evaluation $PT Eval Moderate Complexity: 1 Mod PT Treatments $Gait Training: 8-22 mins   PT G Codes:   PT G-Codes **NOT FOR INPATIENT CLASS** Functional Assessment Tool Used: AM-PAC 6 Clicks Basic Mobility;Clinical judgement Functional Limitation: Mobility: Walking and moving around Mobility: Walking and Moving Around Current Status (S2831): At least 20 percent but less than 40 percent impaired, limited or restricted Mobility: Walking and Moving Around Goal Status [id number]): At least 1 percent but less than 20 percent impaired, limited or restricted    Eric Flattery  Cooley, PT Pager: [phone] 08/23/2017   Eric Cooley 08/23/2017, 2:03 PM

## 2017-08-23 NOTE — Care Management CC44 (Signed)
Condition Code 44 Documentation Completed  Patient Details  Name: Eric Cooley MRN: [id number] Date of Birth: 1920-05-22   Condition Code 44 given:  Yes Patient signature on Condition Code 44 notice:  Yes Documentation of 2 MD's agreement:  Yes Code 44 added to claim:  Yes    Erenest Rasher, RN 08/23/2017, 12:10 PM

## 2017-08-24 DIAGNOSIS — R2681 Unsteadiness on feet: Secondary | ICD-10-CM | POA: Diagnosis not present

## 2017-08-24 DIAGNOSIS — R410 Disorientation, unspecified: Secondary | ICD-10-CM | POA: Diagnosis not present

## 2017-08-24 DIAGNOSIS — F05 Delirium due to known physiological condition: Secondary | ICD-10-CM | POA: Diagnosis not present

## 2017-08-24 DIAGNOSIS — R4182 Altered mental status, unspecified: Secondary | ICD-10-CM | POA: Diagnosis not present

## 2017-08-24 DIAGNOSIS — R531 Weakness: Secondary | ICD-10-CM | POA: Diagnosis not present

## 2017-08-24 NOTE — Discharge Summary (Signed)
Physician Discharge Summary  Eric Cooley AST:[id number] DOB: 08-05-1920 DOA: 08/21/2017  PCP: Biagio Borg, MD  Admit date: 08/21/2017 Discharge date: 08/24/2017  Admitted From: home with daughter Disposition:  SNF  Recommendations for Outpatient Follow-up:  1. Stopped all sedating medications.  Tylenol and lidocaine patches as needed for back pain 2. Repeat TSH in 3-4 weeks 3. Ophthalmology in 1 month 4. Recommend hearing screen   Home Health:  Ongoing PT/OT at SNF Equipment/Devices:  Uses rolling walker  Discharge Condition:  Stable, improved CODE STATUS:  Full code  Diet recommendation:  Regular diet   Brief/Interim Summary:  The patient is a 83 year old male with history of moderate to severe white matter disease/dementia, BPH, coronary artery disease, gastrointestinal stromal tumor of the stomach, prostate cancer.  The patient has been taken care of predominantly by his daughter and normally recognizes her.  He mostly sits or lies in bed lately, although two weeks ago, he went to go vote and walked around [place] for a long time.  On the date of admission, he was extremely delirious, unable to recognize family members, hallucinating.  According to family and notes, the patient fell several weeks ago and developed an acute compression fracture.  He had been taking some hydrocodone and tramadol to treat his pain.  He has also not been eating or drinking very much this week.  He did not have evidence of infection such as pneumonia or UTI or cold symptoms to suggest URI.  He was given IV fluids.  His sedating medications were held.  In the ER, he initially required haldol, but after 12-24 hours, he was back to his baseline mentation and did not require further doses.  Overnight, he was calm and he remains at his baseline mentation today according to his family members.  He continues to be very weak and unsteady and PT/OT recommended SNF.    Discharge Diagnoses:  Principal Problem:    Delirium secondary to multiple medical problems Active Problems:   Unsteady gait   Chronic back pain   Generalized weakness   Delirium, likely due to sedating medications and mild dehydration in the setting of underlying dementia, resolved with IVF and holding medications.  Patient back to baseline.   -CT head: Negative for acute abnormality. -MR brain: No acute intracranial process -TSH: 5.916, mildly elevated, but unlikely to be the cause of his altered mental status -Repeat TSH in 3-4 weeks -UDS negative -Still no evidence of pneumonia or UTI -Troponin 3 negative.  -ABG normal -Echocardiogram: Ejection fraction 55-60% with normal LV diastolic function -  Recommended stopping tramadol and Norco and using tylenol, low dose prn NSAID, heating pad, and lidocaine patches prn.  Currently pain free  Generalized weakness, visit had been preceded by episodes of difficulty walking  -  PT/OT recommending SNF  CVA/WhiteMatterDisease/Dementia - Continue Namzaric1 tablet daily -[facility]. Edison Nasuti.Per note patient had previous cerebellar infarctions, moderate to severe W M.D.   Compression fracture with chronic back pain -  Pain free during stay despite stopping his pain medications -  Would recommend tylenol, heating pad, lidocaine patches for pain management if pain recurs  Anemia of chronic disease, mild and asymptomatic  Weight loss, may be due to progressive dementia -  Defer w/u to PCP -  Appreciate nutrition assistance  Urinary frequency -Check PVR: 108 mL -Will not start any new medications at this time.  PACs.  Appears to have MAT intermittently.  I  did not see evidence of A. Fib even though the monitor was interpreting the rhythm as a-fib.  Either way, the patient is a poor candidate for anticoagulation given his frailty, cerebellar dysfunction, dementia with episodes of delirium.  Patient is a high  falls risk and just had a fall a few weeks ago.  Situation discussed with daughter who was in agreement with no further monitoring or treatment.  Transient double vision.  Possible he had a TIA.  Family would not be interested in CEA so carotid duplex was deferred.  No PFO on ECHO and again, family not interested in surgery anyway.  We discussed utility of aspirin or statin, but again, given his frailty, we decided against starting any medications to reduce long-term risk.  Recommended that he see his ophthalmologist to discuss any minimally invasive procedures that could improve his sight.     Discharge Instructions     Medication List    STOP taking these medications   docusate sodium 100 MG capsule Commonly known as:  COLACE   fexofenadine 180 MG tablet Commonly known as:  ALLEGRA   HYDROcodone-acetaminophen 5-325 MG tablet Commonly known as:  NORCO/VICODIN   traMADol 50 MG tablet Commonly known as:  ULTRAM     TAKE these medications   feeding supplement (ENSURE ENLIVE) Liqd Take 237 mLs 3 (three) times daily between meals by mouth.   Memantine HCl-Donepezil HCl 28-10 MG Cp24 Commonly known as:  NAMZARIC Take 1 tablet by mouth daily.      Follow-up Information    Biagio Borg, MD. Schedule an appointment as soon as possible for a visit in 1 month(s).   Specialties:  Internal Medicine, Radiology Contact information: [place] [address] [phone]          No Known Allergies  Consultations: none   Procedures/Studies: Dg Lumbar Spine 2-3 Views  Result Date: 07/28/2017 CLINICAL DATA:  Fall 10 days ago, low back and bilateral hip pain since. EXAM: LUMBAR SPINE - 2-3 VIEW COMPARISON:  Plain film of the lumbar spine dated 05/15/2017. FINDINGS: Stable mild dextroscoliosis.  Alignment appears stable. New compression fracture deformity involving the superior endplate of the L4 vertebral body, approximately 25% compression anteriorly,  without associated retropulsion. Chronic compression fracture deformity of the L1 vertebral body, not significantly changed in height compared to the recent exam of 05/15/2017. Questionable mild compression deformities of the L2 and L3 vertebral bodies appear stable/chronic. Upper sacrum appears intact and normally aligned. Atherosclerotic changes noted along the walls of the infrarenal abdominal aorta. Paravertebral soft tissues are otherwise unremarkable. IMPRESSION: 1. New compression fracture deformity involving the superior endplate of the L4 vertebral body, approximately 25% compression anteriorly. No associated vertebral body displacement or retropulsion seen. 2. Chronic compression fracture deformity of the L1 vertebral body, not significantly changed compared to the recent exam of 05/15/2017. 3. Stable scoliosis. 4. Aortic atherosclerosis. These results will be called to the ordering clinician or representative by the Radiologist Assistant, and communication documented in the PACS or zVision Dashboard. Electronically Signed   By: Franki Cabot M.D.   On: 07/28/2017 16:26   Ct Head Wo Contrast  Result Date: 08/21/2017 CLINICAL DATA:  Altered level of consciousness. EXAM: CT HEAD WITHOUT CONTRAST TECHNIQUE: Contiguous axial images were obtained from the base of the skull through the vertex without intravenous contrast. COMPARISON:  CT 04/17/2013 FINDINGS: Brain: Progression of generalized atrophy. Progressive white matter hypodensity diffusely. New area of encephalomalacia right lateral temporal lobe compatible  with chronic infarct. Negative for acute infarct, hemorrhage, or mass lesion. Vascular: Negative for hyperdense vessel Skull: Negative Sinuses/Orbits: Negative Other: None IMPRESSION: No acute abnormality Progression of atrophy and progression of chronic ischemic change since 2014. Electronically Signed   By: Franchot Gallo M.D.   On: 08/21/2017 12:04   Mr Brain Wo Contrast  Result Date:  08/21/2017 CLINICAL DATA:  Altered mental status. History of hypertension, hyperlipidemia, prostate cancer. EXAM: MRI HEAD WITHOUT CONTRAST TECHNIQUE: Coronal and axial diffusion weighted imaging, sagittal T1 sequence. Patient could not tolerate further imaging. COMPARISON:  CT HEAD August 21, 2017 and MRI of the head December 20, 2011. FINDINGS: BRAIN: No reduced diffusion to suggest acute ischemia, hyperacute ischemia or hypercellular tumor. No midline shift or mass effect. No hydrocephalus. No abnormal extra-axial fluid collections. VASCULAR: Nondiagnostic. SKULL AND UPPER CERVICAL SPINE: No abnormal sellar expansion. No suspicious calvarial bone marrow signal. Craniocervical junction maintained. SINUSES/ORBITS: Nondiagnostic. OTHER: None. IMPRESSION: 1. Limited 3 sequence noncontrast MRI head. 2. No acute intracranial process. Electronically Signed   By: Elon Alas M.D.   On: 08/21/2017 18:41   Dg Chest Port 1 View  Result Date: 08/21/2017 CLINICAL DATA:  Altered mental status EXAM: PORTABLE CHEST 1 VIEW COMPARISON:  04/23/2016 FINDINGS: Mildly low lung volumes. Mild atelectasis at the left base. No focal consolidation. Heart size upper normal. Aortic atherosclerosis. No pneumothorax. Old deformity distal left clavicle. IMPRESSION: Low lung volumes with minimal atelectasis at the left base. Electronically Signed   By: Donavan Foil M.D.   On: 08/21/2017 15:27     Subjective: Denies back pain except when moving around.  Denies SOB, nausea.  Eating well.   Discharge Exam: Vitals:   08/23/17 2059 08/24/17 0630  BP: 129/62 129/71  Pulse: 67 69  Resp: 18 18  Temp: 97.8 F (36.6 C) 98.2 F (36.8 C)  SpO2: 93% 97%   Vitals:   08/23/17 0410 08/23/17 1332 08/23/17 2059 08/24/17 0630  BP: (!) 163/84 110/70 129/62 129/71  Pulse: 72 82 67 69  Resp: 16 16 18 18   Temp: 98 F (36.7 C) 98.6 F (37 C) 97.8 F (36.6 C) 98.2 F (36.8 C)  TempSrc: Oral Oral Oral Oral  SpO2: 94% 95% 93% 97%   Weight:    57.4 kg (126 lb 8.7 oz)  Height:       General exam:  Adult male, sitting up in bed.  No acute distress.  HEENT:  NCAT, MMM Respiratory system: Clear to auscultation bilaterally Cardiovascular system: Regular rate and rhythm, normal S1/S2. No murmurs, rubs, gallops or clicks.  Warm extremities Gastrointestinal system: Normal active bowel sounds, soft, nondistended, nontender. MSK:  Normal tone and bulk, no lower extremity edema Neuro:  Grossly intact Psych:  Alert and oriented to person, year, and month.  Grandson accidentally assisted with "hospital," but patient knew he was in [place].     The results of significant diagnostics from this hospitalization (including imaging, microbiology, ancillary and laboratory) are listed below for reference.     Microbiology: Recent Results (from the past 240 hour(s))  Urine culture     Status: Abnormal   Collection Time: 08/21/17 12:15 PM  Result Value Ref Range Status   Specimen Description URINE, CLEAN CATCH  Final   Special Requests NONE  Final   Culture MULTIPLE SPECIES PRESENT, SUGGEST RECOLLECTION (A)  Final   Report Status 08/23/2017 FINAL  Final  Culture, blood (routine x 2)     Status: None (Preliminary result)   Collection Time:  08/21/17  7:10 PM  Result Value Ref Range Status   Specimen Description BLOOD RIGHT ANTECUBITAL  Final   Special Requests IN PEDIATRIC BOTTLE Blood Culture adequate volume  Final   Culture   Final    NO GROWTH 3 DAYS Performed at [facility], [place] [address], [place], [place] [address]    Report Status PENDING  Incomplete  Culture, blood (routine x 2)     Status: None (Preliminary result)   Collection Time: 08/21/17  7:10 PM  Result Value Ref Range Status   Specimen Description BLOOD LEFT ANTECUBITAL  Final   Special Requests IN PEDIATRIC BOTTLE Blood Culture adequate volume  Final   Culture   Final    NO GROWTH 3 DAYS Performed at [facility], [place] [address]  [place], [place] [address]    Report Status PENDING  Incomplete     Labs: BNP (last 3 results) No results for input(s): BNP in the last 8760 hours. Basic Metabolic Panel: Recent Labs  Lab 08/21/17 1313 08/22/17 0547  NA 139 141  K 4.0 3.6  CL 106 109  CO2 28 27  GLUCOSE 94 91  BUN 21* 15  CREATININE 0.98 0.97  CALCIUM 9.1 8.8*  MG  --  1.9   Liver Function Tests: Recent Labs  Lab 08/21/17 1313 08/22/17 0547  AST 21 30  ALT 12* 18  ALKPHOS 110 108  BILITOT 0.7 1.2  PROT 6.8 6.2*  ALBUMIN 3.7 3.4*   No results for input(s): LIPASE, AMYLASE in the last 168 hours. No results for input(s): AMMONIA in the last 168 hours. CBC: Recent Labs  Lab 08/21/17 1313 08/22/17 0547  WBC 4.4 4.7  NEUTROABS 3.1 2.5  HGB 12.7* 12.6*  HCT 38.2* 38.6*  MCV 98.7 99.7  PLT 137* 132*   Cardiac Enzymes: Recent Labs  Lab 08/21/17 1910 08/21/17 2346 08/22/17 0547  TROPONINI <0.03 <0.03 <0.03   BNP: Invalid input(s): POCBNP CBG: Recent Labs  Lab 08/21/17 1316  GLUCAP 90   D-Dimer No results for input(s): DDIMER in the last 72 hours. Hgb A1c No results for input(s): HGBA1C in the last 72 hours. Lipid Profile No results for input(s): CHOL, HDL, LDLCALC, TRIG, CHOLHDL, LDLDIRECT in the last 72 hours. Thyroid function studies Recent Labs    08/21/17 1910  TSH 5.916*   Anemia work up Recent Labs    08/21/17 1910  VITAMINB12 560  FOLATE 78.0  FERRITIN 105  TIBC 297  IRON 87  RETICCTPCT 0.9   Urinalysis    Component Value Date/Time   COLORURINE YELLOW 08/21/2017 1215   APPEARANCEUR CLEAR 08/21/2017 1215   LABSPEC 1.011 08/21/2017 1215   PHURINE 6.0 08/21/2017 1215   GLUCOSEU NEGATIVE 08/21/2017 1215   GLUCOSEU NEGATIVE 05/15/2017 1119   HGBUR MODERATE (A) 08/21/2017 1215   BILIRUBINUR NEGATIVE 08/21/2017 1215   BILIRUBINUR negative 04/11/2016 [place] 08/21/2017 1215   PROTEINUR NEGATIVE 08/21/2017 1215   UROBILINOGEN 0.2 05/15/2017 1119    NITRITE NEGATIVE 08/21/2017 1215   LEUKOCYTESUR NEGATIVE 08/21/2017 1215   Sepsis Labs Invalid input(s): PROCALCITONIN,  WBC,  LACTICIDVEN   Time coordinating discharge: Over 30 minutes  SIGNED:   Janece Canterbury, MD  [facility] 08/24/2017, 11:48 AM Pager   If 7PM-7AM, please contact night-coverage [email or web address] Password [email or web address]

## 2017-08-24 NOTE — Clinical Social Work Placement (Addendum)
   CLINICAL SOCIAL WORK PLACEMENT  NOTE  Date:  08/24/2017  Patient Details  Name: Eric Cooley MRN: [id number] Date of Birth: 1920-05-11  Clinical Social Work is seeking post-discharge placement for this patient at the [place] level of care (*CSW will initial, date and re-position this form in  chart as items are completed):  Yes   Patient/family provided with [facility] Work Department's list of facilities offering this level of care within the geographic area requested by the patient (or if unable, by the patient's family).  Yes   Patient/family informed of their freedom to choose among providers that offer the needed level of care, that participate in Medicare, Medicaid or managed care program needed by the patient, have an available bed and are willing to accept the patient.  Yes   Patient/family informed of [facility]'s ownership interest in [facility] and [facility], as well as of the fact that they are under no obligation to receive care at these facilities.  PASRR submitted to EDS on 08/23/17     PASRR number received on 08/23/17     Existing PASRR number confirmed on       FL2 transmitted to all facilities in geographic area requested by pt/family on 08/23/17     FL2 transmitted to all facilities within larger geographic area on       Patient informed that his/her managed care company has contracts with or will negotiate with certain facilities, including the following:        Yes   Patient/family informed of bed offers received.  Patient chooses bed at [facility]     Physician recommends and patient chooses bed at      Patient to be transferred to [facility] on 08/24/17.  Patient to be transferred to facility by family   Patient family notified on 08/24/17 of transfer.  Name of family member notified:  dtrs     PHYSICIAN Please sign FL2     Additional Comment:     _______________________________________________ Jorge Ny, LCSW 08/24/2017, 3:17 PM

## 2017-08-24 NOTE — Progress Notes (Signed)
Patient will discharge to [facility] Anticipated discharge date: 11/18 Family notified: dtr at bedside Transportation by family- facility requests they leave around 2pm Report #: [phone]  [place] signing off.  Jorge Ny, LCSW Clinical Social Worker [phone]

## 2017-08-25 ENCOUNTER — Telehealth: Payer: Self-pay | Admitting: *Deleted

## 2017-08-25 NOTE — Telephone Encounter (Signed)
Pt was on TCM list admitted 08/21/17 for Delirium secondary to multiple medical problems. On the date of admission, he was extremely delirious, unable to recognize family members, hallucinating. According to family and notes, the patient fell several weeks ago and developed an acute compression fracture. He had been taking some hydrocodone and tramadol to treat his pain. He has also not been eating or drinking very much this week. He did not have evidence of infection such as pneumonia or UTI or cold symptoms to suggest URI.  Pt was d/c 08/24/17, and sent to SNF He continues to be very weak and unsteady and PT/OT has been recommended...Eric Cooley

## 2017-08-26 LAB — CULTURE, BLOOD (ROUTINE X 2)
Culture: NO GROWTH
Culture: NO GROWTH
Special Requests: ADEQUATE
Special Requests: ADEQUATE

## 2017-08-27 NOTE — Progress Notes (Signed)
   08/22/17 1500  OT Time Calculation  OT Start Time (ACUTE ONLY) 1452  OT Stop Time (ACUTE ONLY) 1515  OT Time Calculation (min) 23 min  OT G-codes **NOT FOR INPATIENT CLASS**  Functional Assessment Tool Used Clinical judgement  Functional Limitation Self care  Self Care Current Status (F3832) CJ  Self Care Goal Status (N1916) CI  OT General Charges  $OT Visit 1 Visit  OT Evaluation  $OT Eval Moderate Complexity 1 [address] OTR/L [phone] 08/27/2017

## 2017-09-12 ENCOUNTER — Telehealth: Payer: Self-pay | Admitting: Internal Medicine

## 2017-09-12 NOTE — Telephone Encounter (Signed)
MD is out of the office pls advise if ok for verbal.../lmb

## 2017-09-12 NOTE — Telephone Encounter (Signed)
Copied from [place] [id number]. Topic: Quick Communication - See Telephone Encounter >> Sep 12, 2017  9:03 AM [place], Laban Emperor wrote: CRM for notification. See Telephone encounter for:  Tillie Rung with [facility] calling to request verbal orders for twice a week for 3 weeks and once a for a 1 week for balance training.  Contact number [phone] 09/12/17.

## 2017-09-12 NOTE — Telephone Encounter (Signed)
Can SNF doctor not approve this? Has not been in for hospital follow up. If not, okay for verbal but needs to make follow up with Dr. Jenny Reichmann.

## 2017-09-12 NOTE — Telephone Encounter (Signed)
Copied from [place] [phone]. Topic: Quick Communication - See Telephone Encounter >> Sep 12, 2017  2:59 PM Robina Ade, Helene Kelp D wrote: CRM for notification. See Telephone encounter for: 09/12/17. Sharyn Lull from [facility] called requesting order for PT, OT and Speech Therapy. The order is for 1x for 1 week, 2x for 2 weeks and 1x for 6 weeks. She can be reached at [phone].

## 2017-09-12 NOTE — Telephone Encounter (Signed)
Notified [name] w/MD response. Pt has appt already schedule for 09/15/17...Eric Cooley

## 2017-09-12 NOTE — Telephone Encounter (Signed)
Ok for verbal 

## 2017-09-15 ENCOUNTER — Ambulatory Visit: Payer: Medicare Other | Admitting: Internal Medicine

## 2017-09-16 NOTE — Telephone Encounter (Signed)
Copied from [place] [phone]. Topic: General - Other >> Sep 16, 2017  1:11 PM Synthia Innocent wrote: Reason for CRM: [facility] having to switch speech eval to 09/22/17

## 2017-09-16 NOTE — Telephone Encounter (Signed)
Copied from [place] [phone]. Topic: General - Other >> Sep 16, 2017  1:11 PM Eric Cooley wrote: Reason for CRM: [facility] having to switch speech eval to 09/22/17

## 2017-09-17 NOTE — Telephone Encounter (Signed)
Notified both Conley Rolls w/MD response for verbal orders...Johny Chess

## 2017-09-21 ENCOUNTER — Other Ambulatory Visit: Payer: Self-pay | Admitting: Internal Medicine

## 2017-09-24 ENCOUNTER — Telehealth: Payer: Self-pay | Admitting: Internal Medicine

## 2017-09-24 NOTE — Telephone Encounter (Signed)
Copied from [place] [phone]. Topic: General - Other >> Sep 24, 2017  3:01 PM Aurelio Brash B wrote: Reason for CRM: Alla Feeling from [facility] wants dr Jenny Reichmann to know pt refused speech therapy evaluation        [phone]

## 2017-10-08 ENCOUNTER — Telehealth: Payer: Self-pay | Admitting: Internal Medicine

## 2017-10-08 NOTE — Telephone Encounter (Signed)
Copied from [place] [phone]. Topic: General - Other >> Oct 08, 2017  2:52 PM Vernona Rieger wrote: Rudi Rummage from [facility] called and said that he has refused nursing visits this week, he said they could come next week & they will try again. Call back is [phone]

## 2017-10-20 ENCOUNTER — Telehealth: Payer: Self-pay | Admitting: Internal Medicine

## 2017-10-20 NOTE — Telephone Encounter (Signed)
Copied from [place] [phone]. Topic: Quick Communication - See Telephone Encounter >> Oct 20, 2017  4:32 PM Cleaster Corin, [place] wrote: CRM for notification. See Telephone encounter for:   10/20/17. [facility] calling to let Dr. Cathlean Cower know that he missed his skilled nursing appt. Todday due to no one answering phone calls. Elmyra Ricks can be reached at [phone]

## 2017-12-24 ENCOUNTER — Other Ambulatory Visit: Payer: Self-pay | Admitting: Internal Medicine

## 2017-12-24 ENCOUNTER — Ambulatory Visit: Payer: Medicare Other | Admitting: Internal Medicine

## 2017-12-24 ENCOUNTER — Encounter: Payer: Self-pay | Admitting: Internal Medicine

## 2017-12-24 ENCOUNTER — Other Ambulatory Visit (INDEPENDENT_AMBULATORY_CARE_PROVIDER_SITE_OTHER): Payer: Medicare Other

## 2017-12-24 VITALS — BP 120/74 | HR 66 | Temp 98.0°F | Ht 68.0 in | Wt 135.0 lb

## 2017-12-24 DIAGNOSIS — Z0000 Encounter for general adult medical examination without abnormal findings: Secondary | ICD-10-CM

## 2017-12-24 DIAGNOSIS — Z0001 Encounter for general adult medical examination with abnormal findings: Secondary | ICD-10-CM | POA: Diagnosis not present

## 2017-12-24 LAB — MAGNESIUM: MAGNESIUM: 2.3 mg/dL (ref 1.5–2.5)

## 2017-12-24 LAB — CBC WITH DIFFERENTIAL/PLATELET
BASOS ABS: 0 10*3/uL (ref 0.0–0.1)
Basophils Relative: 1 % (ref 0.0–3.0)
EOS ABS: 0.1 10*3/uL (ref 0.0–0.7)
Eosinophils Relative: 1.1 % (ref 0.0–5.0)
HEMATOCRIT: 40.3 % (ref 39.0–52.0)
HEMOGLOBIN: 13.4 g/dL (ref 13.0–17.0)
LYMPHS PCT: 29.4 % (ref 12.0–46.0)
Lymphs Abs: 1.4 10*3/uL (ref 0.7–4.0)
MCHC: 33.2 g/dL (ref 30.0–36.0)
MCV: 101.4 fl — ABNORMAL HIGH (ref 78.0–100.0)
MONO ABS: 0.4 10*3/uL (ref 0.1–1.0)
Monocytes Relative: 9.2 % (ref 3.0–12.0)
Neutro Abs: 2.7 10*3/uL (ref 1.4–7.7)
Neutrophils Relative %: 59.3 % (ref 43.0–77.0)
Platelets: 189 10*3/uL (ref 150.0–400.0)
RBC: 3.98 Mil/uL — AB (ref 4.22–5.81)
RDW: 15 % (ref 11.5–15.5)
WBC: 4.6 10*3/uL (ref 4.0–10.5)

## 2017-12-24 LAB — BASIC METABOLIC PANEL
BUN: 29 mg/dL — ABNORMAL HIGH (ref 6–23)
CO2: 24 mEq/L (ref 19–32)
CREATININE: 1.3 mg/dL (ref 0.40–1.50)
Calcium: 10.1 mg/dL (ref 8.4–10.5)
Chloride: 105 mEq/L (ref 96–112)
GFR: 54.44 mL/min — AB (ref >60.00)
GLUCOSE: 92 mg/dL (ref 70–99)
POTASSIUM: 4.1 meq/L (ref 3.5–5.1)
Sodium: 145 mEq/L (ref 135–145)

## 2017-12-24 LAB — LIPID PANEL
CHOLESTEROL: 225 mg/dL — AB (ref 0–200)
HDL: 119.8 mg/dL (ref >39.00)
LDL CALC: 92 mg/dL (ref 0–99)
NONHDL: 105.33
Total CHOL/HDL Ratio: 2
Triglycerides: 67 mg/dL (ref 0.0–149.0)
VLDL: 13.4 mg/dL (ref 0.0–40.0)

## 2017-12-24 LAB — HEPATIC FUNCTION PANEL
ALK PHOS: 79 U/L (ref 39–117)
ALT: 16 U/L (ref 0–53)
AST: 25 U/L (ref 0–37)
Albumin: 4.5 g/dL (ref 3.5–5.2)
BILIRUBIN DIRECT: 0.1 mg/dL (ref 0.0–0.3)
BILIRUBIN TOTAL: 0.3 mg/dL (ref 0.2–1.2)
TOTAL PROTEIN: 7.5 g/dL (ref 6.0–8.3)

## 2017-12-24 LAB — URINALYSIS, ROUTINE W REFLEX MICROSCOPIC
Bilirubin Urine: NEGATIVE
Ketones, ur: NEGATIVE
Leukocytes, UA: NEGATIVE
Nitrite: NEGATIVE
SPECIFIC GRAVITY, URINE: 1.025 (ref 1.000–1.030)
TOTAL PROTEIN, URINE-UPE24: NEGATIVE
URINE GLUCOSE: NEGATIVE
UROBILINOGEN UA: 0.2 (ref 0.0–1.0)
pH: 6 (ref 5.0–8.0)

## 2017-12-24 LAB — TSH: TSH: 5.47 u[IU]/mL — AB (ref 0.35–4.50)

## 2017-12-24 MED ORDER — ZOSTER VAC RECOMB ADJUVANTED 50 MCG/0.5ML IM SUSR: 0.5000 mL | Freq: Once | INTRAMUSCULAR | 1 refills | Status: AC

## 2017-12-24 MED ORDER — LEVOTHYROXINE SODIUM 25 MCG PO TABS: 25.0000 ug | ORAL_TABLET | Freq: Every day | ORAL | 3 refills | Status: AC

## 2017-12-24 MED ORDER — MEMANTINE HCL-DONEPEZIL HCL ER 28-10 MG PO CP24: 1.0000 | ORAL_CAPSULE | Freq: Every day | ORAL | 1 refills | Status: AC

## 2017-12-24 NOTE — Progress Notes (Signed)
Subjective:    Patient ID: Eric Cooley, male    DOB: 02-05-1920, 84 y.o.   MRN: [id number]  HPI  Here for wellness and f/u;  Overall doing ok;  Pt denies Chest pain, worsening SOB, DOE, wheezing, orthopnea, PND, worsening LE edema, palpitations, dizziness or syncope.  Pt denies neurological change such as new headache, facial or extremity weakness.  Pt denies polydipsia, polyuria, or low sugar symptoms. Pt states overall good compliance with treatment and medications, good tolerability, and has been trying to follow appropriate diet.  Pt denies worsening depressive symptoms, suicidal ideation or panic. No fever, night sweats, wt loss, loss of appetite, or other constitutional symptoms.  Pt states good ability with ADL's, has low fall risk, home safety reviewed and adequate, no other significant changes in hearing, and only occasionally active with exercise. Due for left cataract as well as right retinal procedure at [facility] soon. Pt continues to have recurring LBP without change in severity, bowel or bladder change, fever, wt loss,  worsening LE pain/numbness/weakness, gait change or falls, no further falls after fall last fall.  No other new complaints or interval change Past Medical History:  Diagnosis Date  . Allergic rhinitis, cause unspecified 03/29/2011  . Anemia 05/2009   blood loss anemia, transfused 6 units.   . Arthritis   . BPH (benign prostatic hypertrophy) 03/29/2011  . CAD (coronary artery disease) 05/2012  . Colon polyp 05/2012   cecal, ascending tubular adenomas.   . DJD (degenerative joint disease), lumbar 04/01/2011  . Gastrointestinal stromal tumor (GIST) of stomach ([place]) 05/2009, 10/2009   2010: gastric ulcer with GIST: IR embolization left gastric artery.  2011: partial gastrectomy  . GERD (gastroesophageal reflux disease) 03/29/2011  . History of nuclear stress test 03/27/2010   exercise; no ischemic changes, normal study   . HTN (hypertension) 03/29/2011  . Hyperlipidemia 03/29/2011   . Low back pain   . MI (myocardial infarction) ([place])   . Prostate cancer ([place]) 03/29/2011  . S/P cholecystectomy 03/29/2011  . 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 Urine incontinence    Past Surgical History:  Procedure Laterality Date  . CHOLECYSTECTOMY  1986  . CORONARY ANGIOPLASTY WITH STENT PLACEMENT  04/24/2009   2 Xience DES to LAD 3.5x74mm and 3.0x6mm overlapping  . gastric artery emoblization Left 05/2009  . PARTIAL GASTRECTOMY  10/2009   GIST  . TONSILLECTOMY  1951  . TRANSTHORACIC ECHOCARDIOGRAM  10/09/2009   EF 55-60%; normaml LV systolic function with grade 1 diastolic dysfunction    reports that he quit smoking about 57 years ago. He has never used smokeless tobacco. He reports that he drinks about 0.6 oz of alcohol per week. He reports that he does not use drugs. family history includes Arthritis in his father and mother; Heart failure in his mother; Lung cancer in his father; Ovarian cancer in his mother. No Known Allergies Current Outpatient Medications on File Prior to Visit  Medication Sig Dispense Refill  . feeding supplement, ENSURE ENLIVE, (ENSURE ENLIVE) LIQD Take 237 mLs 3 (three) times daily between meals by mouth. 90 Bottle 0  . [DISCONTINUED] fluticasone (FLONASE) 50 MCG/ACT nasal spray Place 2 sprays into the nose daily.       No current facility-administered medications on file prior to visit.    Review of Systems Constitutional: Negative for other unusual diaphoresis, sweats, appetite or weight changes HENT: Negative for other worsening hearing loss, ear pain, facial swelling, mouth sores or neck stiffness.  Eyes: Negative for other worsening pain, redness or other visual disturbance.  Respiratory: Negative for other stridor or swelling Cardiovascular: Negative for other palpitations or other chest pain  Gastrointestinal: Negative for worsening diarrhea or loose stools, blood in stool, distention or other  pain Genitourinary: Negative for hematuria, flank pain or other change in urine volume.  Musculoskeletal: Negative for myalgias or other joint swelling.  Skin: Negative for other color change, or other wound or worsening drainage.  Neurological: Negative for other syncope or numbness. Hematological: Negative for other adenopathy or swelling Psychiatric/Behavioral: Negative for hallucinations, other worsening agitation, SI, self-injury, or new decreased concentration All other system neg per pt    Objective:   Physical Exam BP 120/74   Pulse 66   Temp 98 F (36.7 C) (Oral)   Ht 5\' 8"  (1.727 m)   Wt 135 lb (61.2 kg)   SpO2 97%   BMI 20.53 kg/m  VS noted, frail, elderly Constitutional: Pt is oriented to person, place, and time. Appears well-developed and well-nourished, in no significant distress and comfortable Head: Normocephalic and atraumatic  Eyes: Conjunctivae and EOM are normal. Pupils are equal, round, and reactive to light Right Ear: External ear normal without discharge Left Ear: External ear normal without discharge Nose: Nose without discharge or deformity Mouth/Throat: Oropharynx is without other ulcerations and moist  Neck: Normal range of motion. Neck supple. No JVD present. No tracheal deviation present or significant neck LA or mass Cardiovascular: Normal rate, regular rhythm, normal heart sounds and intact distal pulses.   Pulmonary/Chest: WOB normal and breath sounds without rales or wheezing  Abdominal: Soft. Bowel sounds are normal. NT. No HSM  Musculoskeletal: Normal range of motion. Exhibits no edema Lymphadenopathy: Has no other cervical adenopathy.  Neurological: Pt is alert and oriented to person, place, and time. Pt has normal reflexes. No cranial nerve deficit. Motor grossly intact, Gait intact Skin: Skin is warm and dry. No rash noted or new ulcerations Psychiatric:  Has normal mood and affect. Behavior is normal without agitation No other exam  findings     Assessment & Plan:

## 2017-12-24 NOTE — Patient Instructions (Addendum)
Your shingles shot was sent to the pharmacy  Please continue all other medications as before, and refills have been done if requested.  Please have the pharmacy call with any other refills you may need.  Please continue your efforts at being more active, low cholesterol diet, and weight control.  You are otherwise up to date with prevention measures today.  Please keep your appointments with your specialists as you may have planned  Please go to the LAB in the Basement (turn left off the elevator) for the tests to be done today  You will be contacted by phone if any changes need to be made immediately.  Otherwise, you will receive a letter about your results with an explanation, but please check with MyChart first.  Please remember to sign up for MyChart if you have not done so, as this will be important to you in the future with finding out test results, communicating by private email, and scheduling acute appointments online when needed.  Please return in 6 months, or sooner if needed 

## 2017-12-25 ENCOUNTER — Telehealth: Payer: Self-pay

## 2017-12-25 NOTE — Telephone Encounter (Signed)
-----   Message from Biagio Borg, MD sent at 12/24/2017  5:44 PM EDT ----- Left message on MyChart, pt to cont same tx except  The test results show that your current treatment is OK, except the thyroid test is mildly abnormal, meaning that you appear to have a very mild condition of low thyroid function (hypothyroidism).  We should start a very low dose of thyroid medication, but you likely will not feel any different since the problem is so small.    Shirron to please inform pt daughter, I will do rx

## 2017-12-25 NOTE — Telephone Encounter (Signed)
Pts daughter, Gregary Signs, has been informed and expressed understanding.

## 2017-12-27 NOTE — Assessment & Plan Note (Signed)

## 2017-12-29 ENCOUNTER — Other Ambulatory Visit: Payer: Self-pay | Admitting: Internal Medicine

## 2017-12-29 MED ORDER — TRAMADOL HCL 50 MG PO TABS: 50.0000 mg | ORAL_TABLET | Freq: Three times a day (TID) | ORAL | 1 refills | Status: DC | PRN

## 2017-12-29 NOTE — Telephone Encounter (Signed)
Done per fax request

## 2018-04-27 ENCOUNTER — Telehealth: Payer: Self-pay | Admitting: Internal Medicine

## 2018-04-27 DIAGNOSIS — R269 Unspecified abnormalities of gait and mobility: Secondary | ICD-10-CM

## 2018-04-27 NOTE — Telephone Encounter (Unsigned)
Copied from [place] [id number]. Topic: Quick Communication - See Telephone Encounter >> Apr 27, 2018 10:41 AM Percell Belt A wrote: CRM for notification. See Telephone encounter for: 04/27/18.  Pt daughter called in and stated that pt talked to Dr Jenny Reichmann about [facility] in [place].  She would like to know if there can be a order faxed to: [facility]     Fax number [phone] Phone number [phone]

## 2018-04-28 ENCOUNTER — Other Ambulatory Visit: Payer: Self-pay | Admitting: Internal Medicine

## 2018-04-28 NOTE — Telephone Encounter (Signed)
I have referred to outpatient PT as per request per EMR

## 2018-04-28 NOTE — Telephone Encounter (Signed)
Referral has been faxed to [phone]

## 2018-04-28 NOTE — Telephone Encounter (Signed)
Done erx 

## 2018-05-20 ENCOUNTER — Telehealth: Payer: Self-pay

## 2018-05-20 DIAGNOSIS — R269 Unspecified abnormalities of gait and mobility: Secondary | ICD-10-CM

## 2018-05-20 NOTE — Telephone Encounter (Signed)
Ok this is done 

## 2018-05-20 NOTE — Addendum Note (Signed)
Addended by: Biagio Borg on: 05/20/2018 05:10 PM   Modules accepted: Orders

## 2018-05-20 NOTE — Telephone Encounter (Signed)
Copied from [place] [phone]. Topic: Referral - Request >> May 20, 2018  2:53 PM Conception Chancy, NT wrote: Reason for CRM: Almyra Free is calling from [facility] she states the patient is coming there and she is needing to get a referral faxed to her.   Fax# [phone]  Cb# [phone]

## 2018-05-26 ENCOUNTER — Telehealth: Payer: Self-pay | Admitting: Internal Medicine

## 2018-05-26 NOTE — Telephone Encounter (Signed)
Patient's daughter, Clide Remmers, came into the office and dropped off a Medical Certification for Application and Renewal of Disability Parking Placard to be completed. Gregary Signs would like a call when this has been completed so that they can come by and pick it up [phone]). Placed in Brittany's box for completion.

## 2018-05-26 NOTE — Telephone Encounter (Signed)
Form for request has been completed & placed in providers box to review/approve and sign.

## 2018-05-27 NOTE — Telephone Encounter (Signed)
Form has been signed, copy sent to scan.   Gregary Signs has been informed.

## 2018-06-06 MED ORDER — MORPHINE SULFATE 2 MG/ML IJ SOLN
2.00 | INTRAMUSCULAR | Status: DC
Start: 2018-06-06 — End: 2018-06-06

## 2018-06-06 MED ORDER — LORAZEPAM 2 MG/ML IJ SOLN
1.00 | INTRAMUSCULAR | Status: DC
Start: ? — End: 2018-06-06

## 2018-06-06 MED ORDER — ACETAMINOPHEN 650 MG RE SUPP
650.00 | RECTAL | Status: DC
Start: ? — End: 2018-06-06

## 2018-06-06 MED ORDER — GLYCOPYRROLATE 0.2 MG/ML IJ SOLN
0.20 | INTRAMUSCULAR | Status: DC
Start: ? — End: 2018-06-06

## 2018-06-15 ENCOUNTER — Telehealth: Payer: Self-pay | Admitting: Emergency Medicine

## 2018-06-15 NOTE — Telephone Encounter (Signed)
Copied from [place] [id number]. Topic: General - Other >> Jun 15, 2018  9:36 AM Burchel, Abbi R wrote: Pt's daughter called to let Dr Jenny Reichmann know that pt has passed away.

## 2018-06-15 NOTE — Telephone Encounter (Signed)
Noted, very sorry

## 2018-06-26 ENCOUNTER — Ambulatory Visit: Payer: Medicare Other | Admitting: Internal Medicine

## 2018-07-07 DEATH — deceased
